# Patient Record
Sex: Male | Born: 1935
Health system: Southern US, Community
[De-identification: ages and names within clinical notes are randomized; demographics above are authoritative.]

## PROBLEM LIST (undated history)

## (undated) DIAGNOSIS — I480 Paroxysmal atrial fibrillation: Secondary | ICD-10-CM

## (undated) DIAGNOSIS — I341 Nonrheumatic mitral (valve) prolapse: Secondary | ICD-10-CM

## (undated) DIAGNOSIS — I495 Sick sinus syndrome: Secondary | ICD-10-CM

## (undated) DIAGNOSIS — I714 Abdominal aortic aneurysm, without rupture, unspecified: Secondary | ICD-10-CM

## (undated) DIAGNOSIS — D649 Anemia, unspecified: Secondary | ICD-10-CM

## (undated) DIAGNOSIS — J449 Chronic obstructive pulmonary disease, unspecified: Secondary | ICD-10-CM

## (undated) DIAGNOSIS — C8510 Unspecified B-cell lymphoma, unspecified site: Secondary | ICD-10-CM

## (undated) DIAGNOSIS — I4510 Unspecified right bundle-branch block: Secondary | ICD-10-CM

## (undated) DIAGNOSIS — I10 Essential (primary) hypertension: Secondary | ICD-10-CM

## (undated) DIAGNOSIS — J189 Pneumonia, unspecified organism: Secondary | ICD-10-CM

## (undated) DIAGNOSIS — C8590 Non-Hodgkin lymphoma, unspecified, unspecified site: Secondary | ICD-10-CM

## (undated) HISTORY — DX: Abdominal aortic aneurysm, without rupture: I71.4

## (undated) HISTORY — DX: Abdominal aortic aneurysm, without rupture, unspecified: I71.40

## (undated) HISTORY — DX: Paroxysmal atrial fibrillation: I48.0

## (undated) HISTORY — DX: Non-Hodgkin lymphoma, unspecified, unspecified site: C85.90

## (undated) HISTORY — DX: Unspecified right bundle-branch block: I45.10

## (undated) HISTORY — PX: CIRCUMCISION: SUR203

## (undated) HISTORY — PX: TONSILLECTOMY: SUR1361

## (undated) HISTORY — DX: Sick sinus syndrome: I49.5

## (undated) NOTE — *Deleted (*Deleted)
PT status update:  Pt has been NPO since midnight.    Pt stated to RN that it was his wish to

---

## 1979-03-26 HISTORY — PX: CHOLECYSTECTOMY: SHX55

## 1989-03-25 HISTORY — PX: CHEST TUBE INSERTION: SHX231

## 2001-07-23 ENCOUNTER — Encounter: Admission: RE | Admit: 2001-07-23 | Discharge: 2001-07-23 | Payer: Self-pay | Admitting: Internal Medicine

## 2001-07-23 ENCOUNTER — Encounter: Payer: Self-pay | Admitting: Internal Medicine

## 2001-08-14 ENCOUNTER — Other Ambulatory Visit: Admission: RE | Admit: 2001-08-14 | Discharge: 2001-08-14 | Payer: Self-pay | Admitting: Urology

## 2001-08-21 ENCOUNTER — Encounter: Payer: Self-pay | Admitting: Urology

## 2001-08-21 ENCOUNTER — Encounter: Admission: RE | Admit: 2001-08-21 | Discharge: 2001-08-21 | Payer: Self-pay | Admitting: Urology

## 2001-09-24 ENCOUNTER — Ambulatory Visit (HOSPITAL_COMMUNITY): Admission: RE | Admit: 2001-09-24 | Discharge: 2001-09-24 | Payer: Self-pay | Admitting: Gastroenterology

## 2008-01-10 ENCOUNTER — Encounter: Admission: RE | Admit: 2008-01-10 | Discharge: 2008-01-10 | Payer: Self-pay | Admitting: Internal Medicine

## 2010-08-20 HISTORY — PX: CARDIOVASCULAR STRESS TEST: SHX262

## 2010-09-02 ENCOUNTER — Ambulatory Visit (HOSPITAL_COMMUNITY)
Admission: RE | Admit: 2010-09-02 | Discharge: 2010-09-02 | Disposition: A | Discharge status: Home / Self Care | Payer: Medicare Other | Source: Ambulatory Visit | Attending: Cardiovascular Disease | Admitting: Cardiovascular Disease

## 2010-09-02 DIAGNOSIS — I4891 Unspecified atrial fibrillation: Secondary | ICD-10-CM | POA: Insufficient documentation

## 2010-09-02 DIAGNOSIS — R9439 Abnormal result of other cardiovascular function study: Secondary | ICD-10-CM | POA: Insufficient documentation

## 2010-09-02 DIAGNOSIS — I714 Abdominal aortic aneurysm, without rupture, unspecified: Secondary | ICD-10-CM | POA: Insufficient documentation

## 2010-09-02 DIAGNOSIS — I059 Rheumatic mitral valve disease, unspecified: Secondary | ICD-10-CM | POA: Insufficient documentation

## 2010-09-02 DIAGNOSIS — I10 Essential (primary) hypertension: Secondary | ICD-10-CM | POA: Insufficient documentation

## 2010-09-02 HISTORY — PX: CARDIAC CATHETERIZATION: SHX172

## 2010-09-10 NOTE — Procedures (Signed)
  NAME:  [name], [name] NO.:  [id number]  MEDICAL RECORD NO.:  [id number]           PATIENT TYPE:  O  LOCATION:  [facility]                         FACILITY:  [facility]  PHYSICIAN:  Nicki Guadalajara, M.D.     DATE OF BIRTH:  [birth year]  DATE OF PROCEDURE:  09/02/2010 DATE OF DISCHARGE:  09/02/2010                           CARDIAC CATHETERIZATION   INDICATIONS:  [name] is a 77 year old gentleman who has a history of mitral valve prolapse and paroxysmal atrial fibrillation.  He has also history of hypertension.  He recently underwent a nuclear perfusion study, which revealed suggestion of mild scar with possible mild-to-moderate ischemia in the basal inferoseptal, basal inferior, mid inferoseptal, mid inferior region.  Poststress ejection fraction was normal at 56%.  He had normal systolic function.  This study was different from a prior study and consequently, he is now referred for definitive cardiac catheterization.PROCEDURE:  After premedication with Versed 1 mg plus fentanyl 25 mcg, the patient was prepped and draped in usual fashion.  His right femoral artery was punctured anteriorly and a 5-French sheath was inserted without difficulty.  Diagnostic cardiac catheterization was done utilizing 5-French Judkins for left and right coronary catheters.  A 5- French pigtail catheter was used for a left ventriculography.  With the patient's hypertensive history, distal aortography was also performed to make sure there was no evidence for renovascular etiology.  Hemostasis was obtained by direct manual pressure.  The patient tolerated the procedure well.  HEMODYNAMIC DATA:  Central aortic pressure is 148/64.  Left ventricular pressure 148/70, post A-wave 23.  ANGIOGRAPHIC DATA:  The left main coronary artery was angiographically normal and bifurcated into a large LAD system and a small circumflex system.  The LAD was angiographically normal and gave rise to several  diagonal vessels and septal perforating arteries and extended to the apex.  The circumflex vessel with a small vessel, which gave rise to one marginal vessel.  The right coronary was a large dominant vessel that gave rise to a large PDA and posterolateral system and was free of significant disease.  Left ventriculography revealed evidence for mitral valve prolapse.  LV function was normal without wall motion abnormality with an ejection fraction of at least 55%.  Distal aortography revealed normal renal arteries.  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  IMPRESSION: 1. Normal left ventricular function. 2. Mitral valve prolapse. 3. Normal coronary arteries. 4. 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          ______________________________ Nicki Guadalajara, M.D.     TK/MEDQ  D:  09/02/2010  T:  09/03/2010  Job:  [id number]  cc:   Gerlene Burdock A. Alanda Amass, M.D. Theressa Millard, M.D.  Electronically Signed by Nicki Guadalajara M.D. on 09/10/2010 11:18:26 AM

## 2010-12-10 NOTE — Procedures (Signed)
[facility]  Patient:    Thomas Doyle, Thomas Doyle Visit Number: [id number] MRN: [id number]          Service Type: Attending:  Verlin Grills, M.D. Dictated by:   Verlin Grills, M.D. Proc. Date: 09/24/01   CC:         Jerl Santos, M.D.   Procedure Report  REFERRING PHYSICIAN:  Jerl Santos, M.D., [facility].  PROCEDURE:  Screening colonoscopy.  PROCEDURE INDICATION:  [name] is a 77 year old male born 1929-07-02. [name] is referred for his first screening colonoscopy with polypectomy to prevent colon cancer.  I discussed with [name] the complications associated with colonoscopy and polypectomy including a 15 per 1000 risk of bleeding and 4 per 1000 risk of colon perforation requiring surgical repair. [name] has signed the operative permit.  ENDOSCOPIST:  Verlin Grills, M.D.  PREMEDICATION:  Versed 5 mg, Demerol 50 mg.  ENDOSCOPE:  Olympus Pediatric colonoscope.  DESCRIPTION OF PROCEDURE:  After obtaining informed consent, [name] was placed in the left lateral decubitus position. I administered intravenous Demerol and intravenous Versed to achieve conscious sedation for the procedure. The patients blood pressure, oxygen saturation, and cardiac rhythm were monitored throughout the procedure and documented in the medical record.  Anal inspection was normal. Digital rectal exam revealed a nonnodular prostate. The Olympus Pediatric video colonoscope was introduced into the rectum and easily advanced to the cecum. Colonic preparation for the exam today was excellent.  Rectum:  Normal.  Sigmoid colon and descending colon:  Left colonic diverticulosis.  Splenic flexure:  Normal.  Transverse colon:  Normal.  Hepatic flexure:  Normal.  Ascending colon:  Normal.  Cecum and ileocecal valve:  Normal.  ASSESSMENT:  Left colonic diverticulosis; otherwise normal  proctocolonoscopy to the cecum. No endoscopic evidence for the presence of colorectal neoplasia.   RECOMMENDATIONS:  Repeat colonoscopy in 10 years. Dictated by:   Verlin Grills, M.D. Attending:  Verlin Grills, M.D. DD:  09/24/01 TD:  09/24/01 Job: [id number]

## 2011-10-24 DIAGNOSIS — J189 Pneumonia, unspecified organism: Secondary | ICD-10-CM

## 2011-10-24 HISTORY — DX: Pneumonia, unspecified organism: J18.9

## 2011-11-23 ENCOUNTER — Inpatient Hospital Stay (HOSPITAL_COMMUNITY): Payer: Medicare Other

## 2011-11-23 ENCOUNTER — Encounter (HOSPITAL_COMMUNITY): Payer: Self-pay | Admitting: Emergency Medicine

## 2011-11-23 ENCOUNTER — Inpatient Hospital Stay (HOSPITAL_COMMUNITY)
Admission: EM | Admit: 2011-11-23 | Discharge: 2011-11-25 | DRG: 841 | Disposition: A | Discharge status: Home / Self Care | Payer: Medicare Other | Attending: Internal Medicine | Admitting: Internal Medicine

## 2011-11-23 ENCOUNTER — Emergency Department (HOSPITAL_COMMUNITY): Payer: Medicare Other

## 2011-11-23 DIAGNOSIS — E860 Dehydration: Secondary | ICD-10-CM | POA: Diagnosis present

## 2011-11-23 DIAGNOSIS — I4891 Unspecified atrial fibrillation: Secondary | ICD-10-CM | POA: Diagnosis present

## 2011-11-23 DIAGNOSIS — I10 Essential (primary) hypertension: Secondary | ICD-10-CM | POA: Diagnosis present

## 2011-11-23 DIAGNOSIS — R339 Retention of urine, unspecified: Secondary | ICD-10-CM | POA: Diagnosis present

## 2011-11-23 DIAGNOSIS — J449 Chronic obstructive pulmonary disease, unspecified: Secondary | ICD-10-CM | POA: Diagnosis present

## 2011-11-23 DIAGNOSIS — R17 Unspecified jaundice: Secondary | ICD-10-CM | POA: Diagnosis present

## 2011-11-23 DIAGNOSIS — N179 Acute kidney failure, unspecified: Secondary | ICD-10-CM

## 2011-11-23 DIAGNOSIS — E782 Mixed hyperlipidemia: Secondary | ICD-10-CM

## 2011-11-23 DIAGNOSIS — C8589 Other specified types of non-Hodgkin lymphoma, extranodal and solid organ sites: Principal | ICD-10-CM | POA: Diagnosis present

## 2011-11-23 DIAGNOSIS — C8510 Unspecified B-cell lymphoma, unspecified site: Secondary | ICD-10-CM

## 2011-11-23 DIAGNOSIS — N390 Urinary tract infection, site not specified: Secondary | ICD-10-CM | POA: Diagnosis present

## 2011-11-23 DIAGNOSIS — J4489 Other specified chronic obstructive pulmonary disease: Secondary | ICD-10-CM | POA: Diagnosis present

## 2011-11-23 DIAGNOSIS — D649 Anemia, unspecified: Secondary | ICD-10-CM | POA: Diagnosis present

## 2011-11-23 DIAGNOSIS — A498 Other bacterial infections of unspecified site: Secondary | ICD-10-CM | POA: Diagnosis present

## 2011-11-23 DIAGNOSIS — D696 Thrombocytopenia, unspecified: Secondary | ICD-10-CM | POA: Diagnosis present

## 2011-11-23 HISTORY — DX: Unspecified B-cell lymphoma, unspecified site: C85.10

## 2011-11-23 HISTORY — DX: Anemia, unspecified: D64.9

## 2011-11-23 HISTORY — DX: Essential (primary) hypertension: I10

## 2011-11-23 HISTORY — DX: Chronic obstructive pulmonary disease, unspecified: J44.9

## 2011-11-23 HISTORY — DX: Pneumonia, unspecified organism: J18.9

## 2011-11-23 HISTORY — DX: Nonrheumatic mitral (valve) prolapse: I34.1

## 2011-11-23 LAB — CBC
MCH: 33.5 pg (ref 26.0–34.0)
MCHC: 34.3 g/dL (ref 30.0–36.0)
MCV: 97.5 fL (ref 78.0–100.0)
Platelets: 124 10*3/uL — ABNORMAL LOW (ref 150–400)

## 2011-11-23 LAB — DIFFERENTIAL
Basophils Relative: 1 % (ref 0–1)
Eosinophils Absolute: 0.6 10*3/uL (ref 0.0–0.7)
Eosinophils Relative: 14 % — ABNORMAL HIGH (ref 0–5)
Monocytes Relative: 11 % (ref 3–12)
Neutrophils Relative %: 36 % — ABNORMAL LOW (ref 43–77)

## 2011-11-23 LAB — COMPREHENSIVE METABOLIC PANEL
Albumin: 3.5 g/dL (ref 3.5–5.2)
Alkaline Phosphatase: 109 U/L (ref 39–117)
BUN: 22 mg/dL (ref 6–23)
Calcium: 10.7 mg/dL — ABNORMAL HIGH (ref 8.4–10.5)
GFR calc Af Amer: 36 mL/min — ABNORMAL LOW (ref >90)
Potassium: 3.8 mEq/L (ref 3.5–5.1)
Sodium: 135 mEq/L (ref 135–145)
Total Protein: 5.5 g/dL — ABNORMAL LOW (ref 6.0–8.3)

## 2011-11-23 LAB — URINALYSIS, ROUTINE W REFLEX MICROSCOPIC
Bilirubin Urine: NEGATIVE
Ketones, ur: NEGATIVE mg/dL
Nitrite: NEGATIVE
Urobilinogen, UA: 1 mg/dL (ref 0.0–1.0)

## 2011-11-23 LAB — LIPASE, BLOOD: Lipase: 8 U/L — ABNORMAL LOW (ref 11–59)

## 2011-11-23 LAB — ABO/RH: ABO/RH(D): O POS

## 2011-11-23 LAB — URINE MICROSCOPIC-ADD ON

## 2011-11-23 MED ORDER — IRBESARTAN 150 MG PO TABS
150.0000 mg | ORAL_TABLET | Freq: Every day | ORAL | Status: DC
  Administered 2011-11-24 – 2011-11-25 (×2): 150 mg via ORAL
  Filled 2011-11-23 (×2): qty 1

## 2011-11-23 MED ORDER — IPRATROPIUM BROMIDE 0.02 % IN SOLN
0.5000 mg | Freq: Once | RESPIRATORY_TRACT | Status: AC
  Administered 2011-11-23: 0.5 mg via RESPIRATORY_TRACT
  Filled 2011-11-23: qty 2.5

## 2011-11-23 MED ORDER — ACETAMINOPHEN 650 MG RE SUPP: 650.0000 mg | Freq: Four times a day (QID) | RECTAL | Status: DC | PRN

## 2011-11-23 MED ORDER — ONDANSETRON HCL 4 MG/2ML IJ SOLN
4.0000 mg | Freq: Once | INTRAMUSCULAR | Status: AC
  Administered 2011-11-23: 4 mg via INTRAVENOUS
  Filled 2011-11-23: qty 2

## 2011-11-23 MED ORDER — OMEGA-3-ACID ETHYL ESTERS 1 G PO CAPS
1.0000 g | ORAL_CAPSULE | Freq: Every day | ORAL | Status: DC
  Administered 2011-11-24 – 2011-11-25 (×2): 1 g via ORAL
  Filled 2011-11-23 (×2): qty 1

## 2011-11-23 MED ORDER — SODIUM CHLORIDE 0.9 % IV SOLN: INTRAVENOUS | Status: DC

## 2011-11-23 MED ORDER — TAMSULOSIN HCL 0.4 MG PO CAPS
0.4000 mg | ORAL_CAPSULE | Freq: Every day | ORAL | Status: DC
  Administered 2011-11-23 – 2011-11-25 (×3): 0.4 mg via ORAL
  Filled 2011-11-23 (×3): qty 1

## 2011-11-23 MED ORDER — OMEGA-3 FATTY ACIDS 1000 MG PO CAPS: 1.0000 g | ORAL_CAPSULE | Freq: Every day | ORAL | Status: DC

## 2011-11-23 MED ORDER — ALBUTEROL SULFATE (5 MG/ML) 0.5% IN NEBU
5.0000 mg | INHALATION_SOLUTION | Freq: Once | RESPIRATORY_TRACT | Status: AC
  Administered 2011-11-23: 5 mg via RESPIRATORY_TRACT
  Filled 2011-11-23: qty 1

## 2011-11-23 MED ORDER — ALUM & MAG HYDROXIDE-SIMETH 200-200-20 MG/5ML PO SUSP: 30.0000 mL | Freq: Four times a day (QID) | ORAL | Status: DC | PRN

## 2011-11-23 MED ORDER — SODIUM CHLORIDE 0.9 % IV SOLN
INTRAVENOUS | Status: DC
  Administered 2011-11-24: 01:00:00 via INTRAVENOUS

## 2011-11-23 MED ORDER — ACETAMINOPHEN 325 MG PO TABS
650.0000 mg | ORAL_TABLET | Freq: Once | ORAL | Status: AC
  Administered 2011-11-23: 650 mg via ORAL
  Filled 2011-11-23: qty 2

## 2011-11-23 MED ORDER — ACETAMINOPHEN 325 MG PO TABS: 650.0000 mg | ORAL_TABLET | Freq: Four times a day (QID) | ORAL | Status: DC | PRN

## 2011-11-23 MED ORDER — DEXTROSE 5 % IV SOLN
1.0000 g | Freq: Once | INTRAVENOUS | Status: AC
  Administered 2011-11-23: 1 g via INTRAVENOUS
  Filled 2011-11-23: qty 10

## 2011-11-23 MED ORDER — POLYETHYLENE GLYCOL 3350 17 G PO PACK
17.0000 g | PACK | Freq: Every day | ORAL | Status: DC | PRN
  Filled 2011-11-23: qty 1

## 2011-11-23 MED ORDER — PREDNISONE 20 MG PO TABS
60.0000 mg | ORAL_TABLET | Freq: Once | ORAL | Status: AC
  Administered 2011-11-23: 60 mg via ORAL
  Filled 2011-11-23: qty 3

## 2011-11-23 MED ORDER — HYDROCODONE-ACETAMINOPHEN 5-325 MG PO TABS: 1.0000 | ORAL_TABLET | ORAL | Status: DC | PRN

## 2011-11-23 MED ORDER — ONDANSETRON HCL 4 MG PO TABS: 4.0000 mg | ORAL_TABLET | Freq: Four times a day (QID) | ORAL | Status: DC | PRN

## 2011-11-23 MED ORDER — VITAMIN D3 25 MCG (1000 UNIT) PO TABS
1000.0000 [IU] | ORAL_TABLET | Freq: Every day | ORAL | Status: DC
  Administered 2011-11-24 – 2011-11-25 (×2): 1000 [IU] via ORAL
  Filled 2011-11-23 (×2): qty 1

## 2011-11-23 MED ORDER — DIGOXIN 125 MCG PO TABS
125.0000 ug | ORAL_TABLET | Freq: Every day | ORAL | Status: DC
  Administered 2011-11-24 – 2011-11-25 (×2): 125 ug via ORAL
  Filled 2011-11-23 (×2): qty 1

## 2011-11-23 MED ORDER — ONDANSETRON HCL 4 MG/2ML IJ SOLN: 4.0000 mg | Freq: Four times a day (QID) | INTRAMUSCULAR | Status: DC | PRN

## 2011-11-23 MED ORDER — AMIODARONE HCL 200 MG PO TABS
200.0000 mg | ORAL_TABLET | Freq: Every day | ORAL | Status: DC
  Administered 2011-11-24 – 2011-11-25 (×2): 200 mg via ORAL
  Filled 2011-11-23 (×2): qty 1

## 2011-11-23 MED ORDER — DEXTROSE 5 % IV SOLN: 1.0000 g | INTRAVENOUS | Status: DC

## 2011-11-23 NOTE — ED Notes (Signed)
NWG:[id number]> Expected date:11/23/11<BR> Expected time:<BR> Means of arrival:<BR> Comments:<BR> EMS 41 GC - fever/chills

## 2011-11-23 NOTE — H&P (Addendum)
Hospital Admission Note Date: 11/23/2011  Patient name: Thomas Doyle Medical record number: [id number] Date of birth: [birth year] Age: 78 y.o. Gender: male PCP: Darnelle Bos, MD, MD  Attending physician: Maryruth Bun Meriah Shands, MD Emergency Contact: Kedarius Aloisi (Daugher): [phone] Code Status:  Full  Chief Complaint: Tremulousness and weakness.  History of Present Illness: Thomas Doyle is an 78 y.o. male with a PMH of recently diagnosed COPD and recent treatment of PNA (took amoxicillin) who presents to the hospital with progressive weakness x 2 weeks and tremulousness x 24 hours as well as nocturia (up to 6 x a night) accompanied by dysuria but no hematuria.  The patient also reports a 24 hours history of nausea and vomiting but no hematemesis.  No black or bloody stools.  No frank dyspnea.  Upon initial evaluation in the ER, he was found to be anemic with heme negative stool on rectal exam.  No prior history of blood transfusions, or GI bleeding.  The patient does take a baby aspirin daily.    Past Medical History Past Medical History  Diagnosis Date  . Hypertension   . A-fib   . Anemia   . Pneumonia April 2013  . COPD (chronic obstructive pulmonary disease) dx April 2013  . Mitral valve prolapse     Past Surgical History Past Surgical History  Procedure Date  . Cholecystectomy 1980's  . Chest tube insertion 1990's    fell  thru deck, punctured lung, chest tube  . Tonsillectomy   . Circumcision age 12    Meds: Prior to Admission medications   Medication Sig Start Date End Date Taking? Authorizing Provider  amiodarone (PACERONE) 200 MG tablet Take 200 mg by mouth daily.   Yes Historical Provider, MD  candesartan (ATACAND) 8 MG tablet Take 8 mg by mouth daily.   Yes Historical Provider, MD  cholecalciferol (VITAMIN D) 1000 UNITS tablet Take 1,000 Units by mouth daily.   Yes Historical Provider, MD  clopidogrel (PLAVIX) 75 MG tablet Take 75 mg by mouth daily.   Yes  Historical Provider, MD  digoxin (LANOXIN) 0.125 MG tablet Take 125 mcg by mouth daily.   Yes Historical Provider, MD  fish oil-omega-3 fatty acids 1000 MG capsule Take 1 g by mouth daily.   Yes Historical Provider, MD    Allergies: Review of patient's allergies indicates no known allergies.  Social History: History   Social History  . Marital Status: Married    Spouse Name: N/A    Number of Children: 2  . Years of Education: 14   Occupational History  . Retired from [employer]    Social History Main Topics  . Smoking status: Never Smoker   . Smokeless tobacco: Never Used  . Alcohol Use: No  . Drug Use: No  . Sexually Active:    Other Topics Concern  . Not on file   Social History Narrative   Widowed x 3 years.  Lives alone.  Independent of ADLs and ambulation.      Family History:  Family History  Problem Relation Age of Onset  . Cancer Father   . Heart attack Father   . Atrial fibrillation Mother     Review of Systems: Constitutional: + recent fever and chills but none since being on Amoxicillin;  Appetite diminished; +weight loss; +Fatigue.  HEENT: No blurry vision or diplopia, no pharyngitis or dysphagia CV: No chest pain or arrhythmia.  Resp: No SOB, + dry cough. GI: + N/V, + diarrhea, no melena  or hematochezia.  GU: + dysuria, no hematuria.  MSK: no myalgias/arthralgias.  Neuro:  No headache or focal neurological deficits, +paresthesias.  Psych: No depression or anxiety.  Endo: No thyroid disease or DM.  Skin: No rashes or lesions.  Heme: No anemia or blood dyscrasia   Physical Exam: Blood pressure 130/50, pulse 73, temperature 98.5 F (36.9 C), temperature source Oral, resp. rate 18, SpO2 97.00%. BP 130/50  Pulse 73  Temp(Src) 98.5 F (36.9 C) (Oral)  Resp 18  SpO2 97%  General Appearance:    Alert, cooperative, no distress, appears stated age  Head:    Normocephalic, without obvious abnormality, atraumatic  Eyes:    PERRL, conjunctiva/corneas clear, EOM's  intact     Ears:    Normal TM's external ear canals, both ears  Nose:   Nares normal, septum midline, mucosa normal, no drainage    or sinus tenderness  Throat:   Lips, mucosa, and tongue normal; teeth and gums normal  Neck:   Supple, symmetrical, trachea midline, no adenopathy;       thyroid:  No enlargement/tenderness/nodules; no carotid   bruit or JVD  Back:     Symmetric, no curvature, ROM normal, no CVA tenderness  Lungs:     Clear to auscultation bilaterally, respirations unlabored  Chest wall:    No tenderness or deformity  Heart:    Regular rate and rhythm, S1 and S2 normal, no murmur, rub   or gallop  Abdomen:     Soft, non-tender, bowel sounds active all four quadrants,    no masses, no organomegaly  Rectal:    Normal tone, no prostate enlargement, heme - per EDP  Extremities:   Extremities normal, atraumatic, no cyanosis, trace edema  Pulses:   2+ and symmetric all extremities  Skin:   Skin color pale, texture normal, turgor normal, no rashes or lesions  Lymph nodes:   Cervical, supraclavicular, and axillary nodes normal  Neurologic:   CNII-XII intact. Non-focal.   Lab results: Basic Metabolic Panel:  Lab 11/23/11 4098  NA 135  K 3.8  CL 99  CO2 24  GLUCOSE 100*  BUN 22  CREATININE 1.93*  CALCIUM 10.7*  MG --  PHOS --   GFR CrCl is unknown because there is no height on file for the current visit. Liver Function Tests:  Lab 11/23/11 1445  AST 17  ALT 14  ALKPHOS 109  BILITOT 1.4*  PROT 5.5*  ALBUMIN 3.5    Lab 11/23/11 1445  LIPASE 8*  AMYLASE --    CBC:  Lab 11/23/11 1445  WBC 4.4  NEUTROABS 1.6*  HGB 6.8*  HCT 19.8*  MCV 97.5  PLT 124*    Imaging results:  Dg Chest 2 View  11/23/2011  *RADIOLOGY REPORT*  Clinical Data: Acute mental status change.  SOB.  CHEST - 2 VIEW  Comparison: None  Findings: The heart size appears normal.  There is no pleural effusion or edema.  Biapical scarring is identified.  Scar like density is noted in the right  lung base.  The lungs appear hyperinflated and there is chronic interstitial coarsening.  No focal airspace consolidation noted.  IMPRESSION:  1.  No acute cardiopulmonary abnormalities. 2.  Suspect COPD.  Original Report Authenticated By: Rosealee Albee, M.D.    Assessment & Plan: Principal Problem:  *Normocytic anemia  Presenting signs and symptoms consistent with anemia.  With complaints of intermittent neuropathy, this may represent a B12 deficiency.  Stools are heme negative x1.  We'll send off stools x2 to recheck.  Mildly elevated bilirubin concerning for hemolysis. Will check LDH and haptoglobin.  Unfortunately, anemia panel not sent before blood transfusion initiated. We'll request the lab add it on if possible.  2 units of packed red blood cells ordered by emergency department physician. Active Problems:  Thrombocytopenia  May be secondary to nutritional deficiency.  Anemia panel requested  Acute renal failure  Baseline creatinine not known but no known history of renal disease.  May be secondary to dehydration given recent nausea and vomiting: We'll gently hydrate.  With complaints of nocturia, would obtain a renal ultrasound to rule out obstructive uropathy.  We'll start empirically on Flomax.  Dehydration  Gentle hydration ordered.  History of atrial fibrillation  Continue amiodarone.  Heart sounds regular on exam.  Hyperbilirubinemia  Rule out hemolysis with LDH and bilirubin.  Hypertension  Continue home antihypertensives.  COPD (chronic obstructive pulmonary disease)  Found incidentally on chest radiography when diagnosed with pneumonia.  Not on any treatment for this at present.  Urinary tract infection  Patient has pyuria and bacteria on urinalysis.  Started empirically on Rocephin by the emergency department physician, which we will continue.  Urine culture not sent by emergency department physician before antibiotics initiated,  unfortunately.  Will see if lab can add to urine collected for urinalysis.  Prophylaxis: PAS hoses  Time Spent On Admission: 1 hour.  Enma Maeda 11/23/2011, 6:40 PM Pager [phone]

## 2011-11-23 NOTE — ED Notes (Signed)
Pt given urinal.

## 2011-11-23 NOTE — ED Provider Notes (Signed)
History     CSN: [id number]  Arrival date & time 11/23/11  1315   First MD Initiated Contact with Patient 11/23/11 1319      Chief Complaint  Patient presents with  . Fever  . Chills    (Consider location/radiation/quality/duration/timing/severity/associated sxs/prior treatment) HPI History provided by pt.   Patient's PCP diagnosed him w/ pneumonia via CXR approx 2 weeks ago.  Was experiencing cough, f/c and generalized weakness at that time.  Took a week long course of amoxicillin and followed up with his doctor again.  Infection cleared on CXR but patient had persistent cough.  Was told he may need to be evaluated for COPD if cough did not resolve in 7-10 days.  Pt presents to ED today because cough, non-productive and aggravated by talking, has not improved, and is now associated w/ rigors and worsening generalized weakness.  No known fever and denies SOB and chest/upper back pain.  Has also had N/V/D x 1wk and is not tolerating pos.  Only experiences abd pain at night when he goes to bed; mild and left-sided.  Denies  Hematemesis/hematochezia/melena. Developed dysuria this morning.  Has had nocturia x ~73mo and has already been evaluated for prostate cancer.    Past Medical History  Diagnosis Date  . Hypertension   . A-fib     No past surgical history on file.  No family history on file.  History  Substance Use Topics  . Smoking status: Never Smoker   . Smokeless tobacco: Not on file  . Alcohol Use:       Review of Systems  All other systems reviewed and are negative.    Allergies  Review of patient's allergies indicates no known allergies.  Home Medications   Current Outpatient Rx  Name Route Sig Dispense Refill  . AMIODARONE HCL 200 MG PO TABS Oral Take 200 mg by mouth daily.    [name] CANDESARTAN CILEXETIL 8 MG PO TABS Oral Take 8 mg by mouth daily.    [name] VITAMIN D 1000 UNITS PO TABS Oral Take 1,000 Units by mouth daily.    [name] CLOPIDOGREL BISULFATE 75 MG PO TABS  Oral Take 75 mg by mouth daily.    [name] DIGOXIN 0.125 MG PO TABS Oral Take 125 mcg by mouth daily.    . OMEGA-3 FATTY ACIDS 1000 MG PO CAPS Oral Take 1 g by mouth daily.      BP 148/63  Pulse 90  Temp(Src) 99.5 F (37.5 C) (Oral)  Resp 12  SpO2 96%  Physical Exam  Nursing note and vitals reviewed. Constitutional: He is oriented to person, place, and time. He appears well-developed and well-nourished. No distress.  HENT:  Head: Normocephalic and atraumatic.       Dry mucous membranes  Eyes:       Normal appearance  Neck: Normal range of motion.  Cardiovascular: Normal rate and regular rhythm.   Pulmonary/Chest: Effort normal and breath sounds normal. No respiratory distress. He has no wheezes. He has no rales.       Dry cough.  O2 sat 95-96% on rm air but pt feels better w/ 2L [place].  Abdominal: Soft. Bowel sounds are normal. He exhibits no distension and no mass. There is no tenderness. There is no rebound and no guarding.  Genitourinary:       No CVA tenderness.  Nml rectal tone.  Prostate does not seem to be enlarged and is non-tender.  Stool color normal.  No gross blood.  Musculoskeletal: Normal range of motion.  Neurological: He is alert and oriented to person, place, and time.  Skin: Skin is warm and dry. No rash noted.  Psychiatric: He has a normal mood and affect. His behavior is normal.    ED Course  Procedures (including critical care time)  Labs Reviewed  URINALYSIS, ROUTINE W REFLEX MICROSCOPIC - Abnormal; Notable for the following:    APPearance CLOUDY (*)    Leukocytes, UA MODERATE (*)    All other components within normal limits  CBC - Abnormal; Notable for the following:    RBC 2.03 (*)    Hemoglobin 6.8 (*)    HCT 19.8 (*)    RDW 16.2 (*)    Platelets 124 (*)    All other components within normal limits  DIFFERENTIAL - Abnormal; Notable for the following:    Neutrophils Relative 36 (*)    Neutro Abs 1.6 (*)    Eosinophils Relative 14 (*)    All other  components within normal limits  COMPREHENSIVE METABOLIC PANEL - Abnormal; Notable for the following:    Glucose, Bld 100 (*)    Creatinine, Ser 1.93 (*)    Calcium 10.7 (*)    Total Protein 5.5 (*)    Total Bilirubin 1.4 (*)    GFR calc non Af Amer 31 (*)    GFR calc Af Amer 36 (*)    All other components within normal limits  LIPASE, BLOOD - Abnormal; Notable for the following:    Lipase 8 (*)    All other components within normal limits  URINE MICROSCOPIC-ADD ON - Abnormal; Notable for the following:    Bacteria, UA FEW (*)    Crystals CA OXALATE CRYSTALS (*)    All other components within normal limits  TYPE AND SCREEN  OCCULT BLOOD, POC DEVICE  OCCULT BLOOD X 1 CARD TO LAB, STOOL  PREPARE RBC (CROSSMATCH)  ABO/RH   Dg Chest 2 View  11/23/2011  *RADIOLOGY REPORT*  Clinical Data: Acute mental status change.  SOB.  CHEST - 2 VIEW  Comparison: None  Findings: The heart size appears normal.  There is no pleural effusion or edema.  Biapical scarring is identified.  Scar like density is noted in the right lung base.  The lungs appear hyperinflated and there is chronic interstitial coarsening.  No focal airspace consolidation noted.  IMPRESSION:  1.  No acute cardiopulmonary abnormalities. 2.  Suspect COPD.  Original Report Authenticated By: Rosealee Albee, M.D.     1. Anemia   2. Urinary tract infection       MDM  78yo M, recently treated for pneumonia, presents w/ persistent cough, rigors, worsening generalized weakness, and new onset N/V/D and dysuria.  On exam, afebrile, hypertensive, mildly dehydrated, heart w/ RRR,  coughing, lungs clear, abd benign/non-tender, no CVA ttp.  Will order zofran for nausea and breathing treatment for cough.  Labs and CXR pending.   Pt reports that coughing improved w/ breathing treatment and is nausea controlled. Labs sig for hgb 6.8, hemoccult neg, elevated Cr (no prior for comparison), infected urine.  CXR suspicious for COPD.  All results  discussed w/ pt.  Type and cross for 2 units pRBC and 1g IV rocephin ordered.  He has developed a fever and po tylenol ordered as well.  [facility] consulted for admission.          Otilio Miu, [place] 11/24/11 [phone]

## 2011-11-23 NOTE — ED Notes (Signed)
Unable to obtain labs at this time due to patient receiving blood.

## 2011-11-23 NOTE — ED Notes (Signed)
Per EMS: Pt was dx w/ pneumonia 2.5 weeks ago.  Was prescribed amoxicillin, got better but about 3 days ago, he began to have chills, cough, and general malaise.

## 2011-11-24 DIAGNOSIS — N289 Disorder of kidney and ureter, unspecified: Secondary | ICD-10-CM

## 2011-11-24 LAB — RETICULOCYTES: Retic Ct Pct: 2.9 % (ref 0.4–3.1)

## 2011-11-24 LAB — IRON AND TIBC
TIBC: 220 ug/dL (ref 215–435)
UIBC: 83 ug/dL — ABNORMAL LOW (ref 125–400)

## 2011-11-24 LAB — TYPE AND SCREEN
ABO/RH(D): O POS
Antibody Screen: NEGATIVE
Unit division: 0

## 2011-11-24 LAB — FERRITIN: Ferritin: 331 ng/mL — ABNORMAL HIGH (ref 22–322)

## 2011-11-24 LAB — CBC
HCT: 23.9 % — ABNORMAL LOW (ref 39.0–52.0)
MCHC: 34.7 g/dL (ref 30.0–36.0)
MCV: 90.5 fL (ref 78.0–100.0)
RDW: 19.8 % — ABNORMAL HIGH (ref 11.5–15.5)

## 2011-11-24 LAB — BASIC METABOLIC PANEL
BUN: 27 mg/dL — ABNORMAL HIGH (ref 6–23)
Chloride: 100 mEq/L (ref 96–112)
Creatinine, Ser: 1.76 mg/dL — ABNORMAL HIGH (ref 0.50–1.35)
GFR calc Af Amer: 40 mL/min — ABNORMAL LOW (ref >90)
GFR calc non Af Amer: 35 mL/min — ABNORMAL LOW (ref >90)
Glucose, Bld: 157 mg/dL — ABNORMAL HIGH (ref 70–99)

## 2011-11-24 MED ORDER — BOOST / RESOURCE BREEZE PO LIQD
1.0000 | Freq: Every day | ORAL | Status: DC
  Administered 2011-11-24: 1 via ORAL

## 2011-11-24 NOTE — ED Provider Notes (Signed)
Medical screening examination/treatment/procedure(s) were conducted as a shared visit with non-physician practitioner(s) and myself.  I personally evaluated the patient during the encounter This elderly gentleman, who on my exam is in no distress, now presents with concerns of weakness and nausea/vomiting/diarrhea.  The patient's labs are notable for evidence of anemia as well as a urinary tract infection.  The patient was provided with antibiotics and the process for transfusion was started in the emergency department.  The patient was admitted for further evaluation and management.  Gerhard Munch, MD 11/24/11 1018

## 2011-11-24 NOTE — Progress Notes (Signed)
Subjective: Admission history and physical reviewed, patient presented to hospital with fatigue, found to be anemic, Hemoccult negative. Outpatient labs also showed anemia. Patient describes a history of progressive fatigue no energy. Recently was treated with antibiotics for pneumonia. Also on presentation to the ED had some dysuria. No complaint of shortness of breath, occasional cough, no back pain. No previous evaluation for anemia.  Objective: Vital signs in last 24 hours: Temp:  [97.7 F (36.5 C)-100.7 F (38.2 C)] 97.7 F (36.5 C) (05/02 0519) Pulse Rate:  [59-90] 70  (05/02 0923) Resp:  [12-20] 20  (05/02 0519) BP: (104-206)/(41-78) 104/41 mmHg (05/02 0519) SpO2:  [96 %-100 %] 98 % (05/02 0519) Weight:  [66.2 kg (145 lb 15.1 oz)] 66.2 kg (145 lb 15.1 oz) (05/01 2019) Weight change:  Last BM Date: 11/23/11  Intake/Output from previous day: 05/01 0701 - 05/02 0700 In: 577.5 [I.V.:552.5; Blood:25] Out: 300 [Urine:300] Intake/Output this shift: Total I/O In: 480 [P.O.:480] Out: 400 [Urine:400]  General appearance: alert and cooperative Resp: clear to auscultation bilaterally Cardio: regular rate and rhythm, S1, S2 normal, no murmur, click, rub or gallop GI: soft, non-tender; bowel sounds normal; no masses,  no organomegaly Extremities: extremities normal, atraumatic, no cyanosis or edema  Lab Results:  Results for orders placed during the hospital encounter of 11/23/11 (from the past 24 hour(s))  CBC     Status: Abnormal   Collection Time   11/23/11  2:45 PM      Component Value Range   WBC 4.4  4.0 - 10.5 (K/uL)   RBC 2.03 (*) 4.22 - 5.81 (MIL/uL)   Hemoglobin 6.8 (*) 13.0 - 17.0 (g/dL)   HCT 16.1 (*) 09.6 - 52.0 (%)   MCV 97.5  78.0 - 100.0 (fL)   MCH 33.5  26.0 - 34.0 (pg)   MCHC 34.3  30.0 - 36.0 (g/dL)   RDW 04.5 (*) 40.9 - 15.5 (%)   Platelets 124 (*) 150 - 400 (K/uL)  DIFFERENTIAL     Status: Abnormal   Collection Time   11/23/11  2:45 PM      Component Value  Range   Neutrophils Relative 36 (*) 43 - 77 (%)   Neutro Abs 1.6 (*) 1.7 - 7.7 (K/uL)   Lymphocytes Relative 38  12 - 46 (%)   Lymphs Abs 1.7  0.7 - 4.0 (K/uL)   Monocytes Relative 11  3 - 12 (%)   Monocytes Absolute 0.5  0.1 - 1.0 (K/uL)   Eosinophils Relative 14 (*) 0 - 5 (%)   Eosinophils Absolute 0.6  0.0 - 0.7 (K/uL)   Basophils Relative 1  0 - 1 (%)   Basophils Absolute 0.0  0.0 - 0.1 (K/uL)  COMPREHENSIVE METABOLIC PANEL     Status: Abnormal   Collection Time   11/23/11  2:45 PM      Component Value Range   Sodium 135  135 - 145 (mEq/L)   Potassium 3.8  3.5 - 5.1 (mEq/L)   Chloride 99  96 - 112 (mEq/L)   CO2 24  19 - 32 (mEq/L)   Glucose, Bld 100 (*) 70 - 99 (mg/dL)   BUN 22  6 - 23 (mg/dL)   Creatinine, Ser 8.11 (*) 0.50 - 1.35 (mg/dL)   Calcium 91.4 (*) 8.4 - 10.5 (mg/dL)   Total Protein 5.5 (*) 6.0 - 8.3 (g/dL)   Albumin 3.5  3.5 - 5.2 (g/dL)   AST 17  0 - 37 (U/L)   ALT 14  0 - 53 (U/L)   Alkaline Phosphatase 109  39 - 117 (U/L)   Total Bilirubin 1.4 (*) 0.3 - 1.2 (mg/dL)   GFR calc non Af Amer 31 (*) >90 (mL/min)   GFR calc Af Amer 36 (*) >90 (mL/min)  LIPASE, BLOOD     Status: Abnormal   Collection Time   11/23/11  2:45 PM      Component Value Range   Lipase 8 (*) 11 - 59 (U/L)  URINALYSIS, ROUTINE W REFLEX MICROSCOPIC     Status: Abnormal   Collection Time   11/23/11  3:32 PM      Component Value Range   Color, Urine YELLOW  YELLOW    APPearance CLOUDY (*) CLEAR    Specific Gravity, Urine 1.019  1.005 - 1.030    pH 7.0  5.0 - 8.0    Glucose, UA NEGATIVE  NEGATIVE (mg/dL)   Hgb urine dipstick NEGATIVE  NEGATIVE    Bilirubin Urine NEGATIVE  NEGATIVE    Ketones, ur NEGATIVE  NEGATIVE (mg/dL)   Protein, ur NEGATIVE  NEGATIVE (mg/dL)   Urobilinogen, UA 1.0  0.0 - 1.0 (mg/dL)   Nitrite NEGATIVE  NEGATIVE    Leukocytes, UA MODERATE (*) NEGATIVE   URINE MICROSCOPIC-ADD ON     Status: Abnormal   Collection Time   11/23/11  3:32 PM      Component Value Range    Squamous Epithelial / LPF RARE  RARE    WBC, UA 7-10  <3 (WBC/hpf)   Bacteria, UA FEW (*) RARE    Crystals CA OXALATE CRYSTALS (*) NEGATIVE   OCCULT BLOOD, POC DEVICE     Status: Normal   Collection Time   11/23/11  3:43 PM      Component Value Range   Fecal Occult Bld NEGATIVE    TYPE AND SCREEN     Status: Normal   Collection Time   11/23/11  3:50 PM      Component Value Range   ABO/RH(D) O POS     Antibody Screen NEG     Sample Expiration 11/26/2011     Unit Number [id number]     Blood Component Type RED CELLS,LR     Unit division 00     Status of Unit ISSUED,FINAL     Transfusion Status OK TO TRANSFUSE     Crossmatch Result Compatible     Unit Number [id number]     Blood Component Type RBC LR PHER1     Unit division 00     Status of Unit ISSUED,FINAL     Transfusion Status OK TO TRANSFUSE     Crossmatch Result Compatible    ABO/RH     Status: Normal   Collection Time   11/23/11  3:50 PM      Component Value Range   ABO/RH(D) O POS    PREPARE RBC (CROSSMATCH)     Status: Normal   Collection Time   11/23/11  4:00 PM      Component Value Range   Order Confirmation ORDER PROCESSED BY BLOOD BANK    LACTATE DEHYDROGENASE     Status: Normal   Collection Time   11/24/11  1:53 AM      Component Value Range   LDH 210  94 - 250 (U/L)  CBC     Status: Abnormal   Collection Time   11/24/11  1:53 AM      Component Value Range   WBC 4.1  4.0 - 10.5 (K/uL)  RBC 2.64 (*) 4.22 - 5.81 (MIL/uL)   Hemoglobin 8.3 (*) 13.0 - 17.0 (g/dL)   HCT 40.9 (*) 81.1 - 52.0 (%)   MCV 90.5  78.0 - 100.0 (fL)   MCH 31.4  26.0 - 34.0 (pg)   MCHC 34.7  30.0 - 36.0 (g/dL)   RDW 91.4 (*) 78.2 - 15.5 (%)   Platelets 103 (*) 150 - 400 (K/uL)  BASIC METABOLIC PANEL     Status: Abnormal   Collection Time   11/24/11  1:53 AM      Component Value Range   Sodium 135  135 - 145 (mEq/L)   Potassium 4.0  3.5 - 5.1 (mEq/L)   Chloride 100  96 - 112 (mEq/L)   CO2 24  19 - 32 (mEq/L)   Glucose, Bld 157 (*) 70 - 99  (mg/dL)   BUN 27 (*) 6 - 23 (mg/dL)   Creatinine, Ser 9.56 (*) 0.50 - 1.35 (mg/dL)   Calcium 9.9  8.4 - 21.3 (mg/dL)   GFR calc non Af Amer 35 (*) >90 (mL/min)   GFR calc Af Amer 40 (*) >90 (mL/min)  RETICULOCYTES     Status: Abnormal   Collection Time   11/24/11  1:53 AM      Component Value Range   Retic Ct Pct 2.9  0.4 - 3.1 (%)   RBC. 2.66 (*) 4.22 - 5.81 (MIL/uL)   Retic Count, Manual 77.1  19.0 - 186.0 (K/uL)      Studies/Results: Dg Chest 2 View  11/23/2011  *RADIOLOGY REPORT*  Clinical Data: Acute mental status change.  SOB.  CHEST - 2 VIEW  Comparison: None  Findings: The heart size appears normal.  There is no pleural effusion or edema.  Biapical scarring is identified.  Scar like density is noted in the right lung base.  The lungs appear hyperinflated and there is chronic interstitial coarsening.  No focal airspace consolidation noted.  IMPRESSION:  1.  No acute cardiopulmonary abnormalities. 2.  Suspect COPD.  Original Report Authenticated By: Rosealee Albee, M.D.   US Renal  11/23/2011  *RADIOLOGY REPORT*  Clinical Data: Evaluate for obstructive uropathy in patient with acute renal failure  RENAL/URINARY TRACT ULTRASOUND COMPLETE  Comparison:  Abdominal CT - 01/10/2008; chest radiograph - earlier same day  Findings:  Right Kidney:  Normal cortical thickness, echogenicity and size, measuring 10.5 cm in length.  No focal renal lesions.  No echogenic renal stones. Note is again made of an extrarenal pelvis.  No urinary obstruction.  Left Kidney:  Normal cortical thickness, echogenicity and size, measuring 10.3 cm in length.  No focal renal lesions.  No echogenic renal stones.  No urinary obstruction.  Bladder:  There is incomplete emptying of the urinary bladder. Bladder is otherwise normal.  The prostate again appears borderline enlarged with mass effect upon the undersurface of the bladder.  The spleen is enlarged measuring 14.5 x 9.3 x 12.8 cm (869 cc). Small right-sided pleural  effusion.  IMPRESSION: 1.  No evidence of hydronephrosis. 2.  The urinary bladder is distended with incomplete voiding, likely secondary to an enlarged prostate. 3.  Note is made of a small right-sided pleural effusion. 4.  Splenomegaly incidentally noted.  Original Report Authenticated By: Waynard Reeds, M.D.    Medications:  Prior to Admission:  Prescriptions prior to admission  Medication Sig Dispense Refill  . amiodarone (PACERONE) 200 MG tablet Take 200 mg by mouth daily.      . candesartan (ATACAND)  8 MG tablet Take 8 mg by mouth daily.      . cholecalciferol (VITAMIN D) 1000 UNITS tablet Take 1,000 Units by mouth daily.      . clopidogrel (PLAVIX) 75 MG tablet Take 75 mg by mouth daily.      . digoxin (LANOXIN) 0.125 MG tablet Take 125 mcg by mouth daily.      . fish oil-omega-3 fatty acids 1000 MG capsule Take 1 g by mouth daily.       Scheduled:   . acetaminophen  650 mg Oral Once  . albuterol  5 mg Nebulization Once  . amiodarone  200 mg Oral Daily  . cefTRIAXone (ROCEPHIN)  IV  1 g Intravenous Once  . cefTRIAXone (ROCEPHIN)  IV  1 g Intravenous Q24H  . cholecalciferol  1,000 Units Oral Daily  . digoxin  125 mcg Oral Daily  . ipratropium  0.5 mg Nebulization Once  . irbesartan  150 mg Oral Daily  . omega-3 acid ethyl esters  1 g Oral Daily  . ondansetron (ZOFRAN) IV  4 mg Intravenous Once  . predniSONE  60 mg Oral Once  . Tamsulosin HCl  0.4 mg Oral Daily  . DISCONTD: sodium chloride   Intravenous STAT  . DISCONTD: fish oil-omega-3 fatty acids  1 g Oral Daily   Continuous:   . sodium chloride 75 mL/hr at 11/24/11 0058    Assessment/Plan: @HPROL @  Principal Problem:  *Normocytic anemia Active Problems:  Thrombocytopenia  Acute renal failure  Dehydration  History of atrial fibrillation  Hyperbilirubinemia  Hypertension  COPD (chronic obstructive pulmonary disease)  UTI (lower urinary tract infection)  Patient feels much better since transfusion., History  does not suggest blood loss, differential includes myelodysplastic process, myeloma kidney, homolysis with elevated bilirubin. Hematology will be consult, SPEP UPEP will be obtained. Await further input from hematology. Please note ultrasound is suggested splenomegaly. Current exam does not suggest acute infection, antibiotics will be DC, urine culture will be followed up, with retained urine on ultrasound patient could have a component of bladder outlet obstruction, continue Flomax   LOS: 1 day   Edra Riccardi D 11/24/2011, 10:51 AM

## 2011-11-24 NOTE — Progress Notes (Signed)
   CARE MANAGEMENT NOTE 11/24/2011  Patient:  [facility], [facility]   Account Number:  [id number]  Date Initiated:  11/24/2011  Documentation initiated by:  Jiles Crocker  Subjective/Objective Assessment:   ADMITTED WITH SOB, ANEMIA     Action/Plan:   PCP IS DR Earl Gala; LIVES ALONE   Anticipated DC Date:  12/01/2011   Anticipated DC Plan:  HOME W HOME HEALTH SERVICES          Status of service:  In process, will continue to follow Medicare Important Message given?   (If response is "NO", the following Medicare IM given date fields will be blank)  Per UR Regulation:  Reviewed for med. necessity/level of care/duration of stay  Comments:  11/24/2011- B Boluwatife Flight RN, BSN, MHA

## 2011-11-24 NOTE — Progress Notes (Signed)
INITIAL ADULT NUTRITION ASSESSMENT Date: 11/24/2011   Time: 12:02 PM Reason for Assessment: Nutrition Risk for unintentional weight loss > 10 lb over 1 month  ASSESSMENT: Male 78 y.o.  Dx: Normocytic anemia  Hx:  Past Medical History  Diagnosis Date  . Hypertension   . A-fib   . Anemia   . Pneumonia April 2013  . COPD (chronic obstructive pulmonary disease) dx April 2013  . Mitral valve prolapse     Related Meds:  Scheduled Meds:   . acetaminophen  650 mg Oral Once  . albuterol  5 mg Nebulization Once  . amiodarone  200 mg Oral Daily  . cefTRIAXone (ROCEPHIN)  IV  1 g Intravenous Once  . cholecalciferol  1,000 Units Oral Daily  . digoxin  125 mcg Oral Daily  . ipratropium  0.5 mg Nebulization Once  . irbesartan  150 mg Oral Daily  . omega-3 acid ethyl esters  1 g Oral Daily  . ondansetron (ZOFRAN) IV  4 mg Intravenous Once  . predniSONE  60 mg Oral Once  . Tamsulosin HCl  0.4 mg Oral Daily  . DISCONTD: sodium chloride   Intravenous STAT  . DISCONTD: cefTRIAXone (ROCEPHIN)  IV  1 g Intravenous Q24H  . DISCONTD: fish oil-omega-3 fatty acids  1 g Oral Daily   Continuous Infusions:   . DISCONTD: sodium chloride 75 mL/hr at 11/24/11 0058   PRN Meds:.acetaminophen, acetaminophen, alum & mag hydroxide-simeth, HYDROcodone-acetaminophen, ondansetron (ZOFRAN) IV, ondansetron, polyethylene glycol   Ht: 5\' 10"  (177.8 cm)  Wt: 145 lb 15.1 oz (66.2 kg)  Ideal Wt: 75.45 kg % Ideal Wt: 87.3%  Usual Wt: 155 lb. Per patient in January 2013.  % Usual Wt: 93.5%  *10 lb weight loss over 4 months. 6.45% weight loss from baseline.   Body mass index is 20.94 kg/(m^2). (WNL)  Food/Nutrition Related Hx: Patient reported poor appetite up until this morning. He reported he enjoys the food here, ate 100% at breakfast meal and already ordered lunch. Patient appears thin. Patient reported he has not been eating well over the past few years since his wife past away. Patient reported he is  not much of a snacker. He agreed to try [employer] supplement.   Labs:  CMP     Component Value Date/Time   NA 135 11/24/2011 0153   K 4.0 11/24/2011 0153   CL 100 11/24/2011 0153   CO2 24 11/24/2011 0153   GLUCOSE 157* 11/24/2011 0153   BUN 27* 11/24/2011 0153   CREATININE 1.76* 11/24/2011 0153   CALCIUM 9.9 11/24/2011 0153   PROT 5.5* 11/23/2011 1445   ALBUMIN 3.5 11/23/2011 1445   AST 17 11/23/2011 1445   ALT 14 11/23/2011 1445   ALKPHOS 109 11/23/2011 1445   BILITOT 1.4* 11/23/2011 1445   GFRNONAA 35* 11/24/2011 0153   GFRAA 40* 11/24/2011 0153    Intake/Output Summary (Last 24 hours) at 11/24/11 1208 Last data filed at 11/24/11 0900  Gross per 24 hour  Intake 1057.5 ml  Output    700 ml  Net  357.5 ml    Diet Order: General  Supplements/Tube Feeding: none at this time  IVF:    DISCONTD: sodium chloride Last Rate: 75 mL/hr at 11/24/11 0058    Estimated Nutritional Needs:   Kcal: 0960-4540 Protein: 82.9-98 Fluid: 1 ml per kcal  NUTRITION DIAGNOSIS: -Inadequate oral intake (NI-2.1).  Status: Ongoing  RELATED TO: poor appetite and unintentional weight loss  AS EVIDENCE BY: patient reports history  of poor appetite and unintentional weight loss of 10 lb over 4 months.   MONITORING/EVALUATION(Goals): Weight trends, labs, PO intake 1. PO intake > 75% at meals and supplements 2. Minimize weight loss  EDUCATION NEEDS: -No education needs identified at this time  INTERVENTION: 1. Will order patient [employer] nutrition supplement once daily to promote increased intake of calories and protein. Provides 250 kcal and 9 grams of protein.  2. RD to follow for nutrition plan of care.   Dietitian [phone]  DOCUMENTATION CODES Per approved criteria  -Not Applicable    Iven Finn [facility] 11/24/2011, 12:02 PM

## 2011-11-24 NOTE — Consult Note (Signed)
[facility]  Reason for Consult: Anemia  Primary Physician: Dr. Earl Doyle Cards: Dr. Alanda Doyle  [name] is an 78 y.o.  white male with multiple medical issues listed below, admitted on 11/23/2011 with 2 week history of progressive fatigue  following recent pneumonia . He was found to have severe anemia with a Hb 6.8 and Hct 19.8 requiring transfusion of 2 units of blood with improvement in counts and in symptoms, today at 8.8 and 23.9. His WBC was 4.4 and MCV 97.5. His platelets were 124,000 (splenomegaly incidentally noted in abdominal ultrasound). Of Doyle, ANC was 1.6. His creatinine  was 1.93, with Calcium of 10.7. His T bili was 1.4. Hemoccult 1 of 3  was negative. LDH was normal at 210. SPEP and UPEP with immunofixation are pending. Smear ordered for review. Anemia panel was taken after transfusion, with the following results: B12 177, Folate 14.3,Ferritin 331, TIBC 220, Iron 137, and retic 2.9. Hapto pending.  Of Doyle, patient reports that since around February 2013 he has been in a double-blind study at [facility] receiving "injections to increase muscle mass". He has received about 3 a month, likely total of 9 injections. He states that prior to the study, labs were drawn showing mild anemia. Daughter is to bring all the pertinent records from [facility] for review.  He feels better after the red cell transfusion.    PMH: Past Medical History  Diagnosis Date  . Hypertension   . A-fib   . Anemia   . Pneumonia April 2013  . COPD (chronic obstructive pulmonary disease) found during workup for pneumonia dx April 2013  . Mitral valve prolapse      Acute Renal Insufficiency   Surgeries: Past Surgical History  Procedure Date  . Cholecystectomy 1980's  . Chest tube insertion 1990's    fell  thru deck, punctured lung, chest tube  . Tonsillectomy   . Circumcision age 57    Allergies: No Known Allergies  Medications:  Prior to  Admission:  Prescriptions prior to admission  Medication Sig Dispense Refill  . amiodarone (PACERONE) 200 MG tablet Take 200 mg by mouth daily.      . candesartan (ATACAND) 8 MG tablet Take 8 mg by mouth daily.      . cholecalciferol (VITAMIN D) 1000 UNITS tablet Take 1,000 Units by mouth daily.      . clopidogrel (PLAVIX) 75 MG tablet Take 75 mg by mouth daily.      . digoxin (LANOXIN) 0.125 MG tablet Take 125 mcg by mouth daily.      . fish oil-omega-3 fatty acids 1000 MG capsule Take 1 g by mouth daily.       He does take baby aspirin daily  WUJ:[id number], acetaminophen, alum & mag hydroxide-simeth, HYDROcodone-acetaminophen, ondansetron (ZOFRAN) IV, ondansetron, polyethylene glycol  ROS: Constitutional: Positive for 10 lb weight loss over the last month. Negative for fever at this time, but did have fever and chills when diagnosed with pneumonia,  and positive for  Malaise/fatigue over the last 2 weeks.  Eyes: Negative for blurred vision and double vision.  Respiratory: dry cough,  No hemoptysis and  No significant shortness of breath.  Cardiovascular: Negative for chest pain. GI: Had 24 hrs history of nausea prior to his admission, 2 episodes of vomiting 2-3 weeks ago, no diarrhea or constipation. No change in bowel caliber. No  Melena or hematochezia.  GU: No blood in urine. No loss of urinary control, but has  nocturia, up to 6 episodes. Dysuria upon admission with  cultures pending. Skin: Negative for itching. No rash. No petechia. Mild bruising, not new.   Neurological: No headaches. No motor deficits. He has intermittent neuropathy on hands and feet , some tremors since the study began.  No prior history of blood transfusions.   Family History:  Family History  Problem Relation Age of Onset  . Cancer Father   . Heart attack Father   . Atrial fibrillation Mother    No family history of blood dyscrasia.  Social History:  reports that he has never smoked. He has never used  smokeless tobacco. He reports that he does not drink alcohol or use illicit drugs. He is widowed for 3 years, 2 grown children. He lives alone, independent of ADLs and ambulation. [facility] (Daugher): [phone] is main contact . Retired from [employer] where he worked for 35 years.    Physical Exam  78 year old  in no acute distress A. and O. X3. He looks much younger than his stated age.  General well-developed and well-nourished  HEENT: Normocephalic, atraumatic,conjunctiva pale.  PERRLA. Sclera anicteric.  Oral cavity without thrush or lesions. Neck supple. no thyromegaly, no cervical or supraclavicular adenopathy  Lungs clear bilaterally . No wheezing, rhonchi or rales. No axillary masses. Breasts: not examined. Cardiac regular rate and rhythm normal S1-S2, no murmur , rubs or gallops Abdomen soft nontender , bowel sounds x4. No HSM GU/rectal: negative for scrotal masses or inguinal lymph nodes.  Extremities no clubbing cyanosis or edema. Few small ecchymoses over the extremities     Labs:  CBC   Lab 11/24/11 0153 11/23/11 1445  WBC 4.1 4.4  HGB 8.3* 6.8*  HCT 23.9* 19.8*  PLT 103* 124*  MCV 90.5 97.5  MCH 31.4 33.5  MCHC 34.7 34.3  RDW 19.8* 16.2*  LYMPHSABS -- 1.7  MONOABS -- 0.5  EOSABS -- 0.6  BASOSABS -- 0.0  BANDABS -- --   Blood smear: 11/24/2011-there is a moderate number of ovalocytes, a few teardrops, and a rare target cell. The polychromasia is not increased. The platelets appear normal in number and are generally small. The white cell morphology is remarkable for hypogranular neutrophils. No blasts.  Anemia panel:   [facility] 11/24/11 0153  VITAMINB12 177*  FOLATE 14.3  FERRITIN 331*  TIBC 220  IRON 137*  RETICCTPCT 2.9     Basename 11/24/11 0153  TSH 1.008  T4TOTAL --  T3FREE --  THYROIDAB --     CMP    Lab 11/24/11 0153 11/23/11 1445  NA 135 135  K 4.0 3.8  CL 100 99  CO2 24 24  GLUCOSE 157* 100*  BUN 27* 22  CREATININE 1.76* 1.93*    CALCIUM 9.9 10.7*  MG -- --  AST -- 17  ALT -- 14  ALKPHOS -- 109  BILITOT -- 1.4*        Component Value Date/Time   BILITOT 1.4* 11/23/2011 1445      Imaging Studies:  Dg Chest 2 View  11/23/2011  *RADIOLOGY REPORT*  Clinical Data: Acute mental status change.  SOB.  CHEST - 2 VIEW  Comparison: None  Findings: The heart size appears normal.  There is no pleural effusion or edema.  Biapical scarring is identified.  Scar like density is noted in the right lung base.  The lungs appear hyperinflated and there is chronic interstitial coarsening.  No focal airspace consolidation noted.  IMPRESSION:  1.  No acute cardiopulmonary abnormalities. 2.  Suspect COPD.  Original Report Authenticated By: Rosealee Albee, M.D.   US Renal  11/23/2011  *RADIOLOGY REPORT*  Clinical Data: Evaluate for obstructive uropathy in patient with acute renal failure  RENAL/URINARY TRACT ULTRASOUND COMPLETE  Comparison:  Abdominal CT - 01/10/2008; chest radiograph - earlier same day  Findings:  Right Kidney:  Normal cortical thickness, echogenicity and size, measuring 10.5 cm in length.  No focal renal lesions.  No echogenic renal stones. Doyle is again made of an extrarenal pelvis.  No urinary obstruction.  Left Kidney:  Normal cortical thickness, echogenicity and size, measuring 10.3 cm in length.  No focal renal lesions.  No echogenic renal stones.  No urinary obstruction.  Bladder:  There is incomplete emptying of the urinary bladder. Bladder is otherwise normal.  The prostate again appears borderline enlarged with mass effect upon the undersurface of the bladder.  The spleen is enlarged measuring 14.5 x 9.3 x 12.8 cm (869 cc). Small right-sided pleural effusion.  IMPRESSION: 1.  No evidence of hydronephrosis. 2.  The urinary bladder is distended with incomplete voiding, likely secondary to an enlarged prostate. 3.  Doyle is made of a small right-sided pleural effusion. 4.  Splenomegaly incidentally noted.  Original Report  Authenticated By: Waynard Reeds, M.D.        A/P: 78 y.o. male asked to see for evaluation of anemia in the setting of recent infection, plus minus recent double blind study drug use. possibility of bone marrow disease should be entertained, in view of mild renal insufficiency, anemia and mild hypercalcemia.   Smear to be reviewed for rouleaux or other abnormalities..  Dr. Truett Perna is to see the patient following this consult and an addendum to be written. Will need the records from the [facility] to be faxed to chart as soon as possible for review of labs. Thank you for the referral .  Impression: 1. Normocytic anemia 2. Thrombocytopenia 3. Splenomegaly on a renal ultrasound 4. History of atrial fibrillation 5. Recent "pneumonia " 6. Renal insufficiency  He was admitted with severe symptomatic anemia. The differential diagnosis includes multiple myeloma, myelodysplasia, and a lymphoproliferative disorder. The anemia could be related to renal insufficiency.  Recommendations: 1. Check serum protein electrophoresis, serum immunofixation, serum free light chains, and quantitative immunoglobulin levels 2. Proceed with a diagnostic bone marrow biopsy with samples to be sent for flow cytometry and cytogenetics 3. We will followup on previous CBC data and records regarding the clinical trial at [facility] 4. Discharge when stable per the medicine service with outpatient followup at the [facility].  [place] [facility] E 11/24/2011 11:38 AM

## 2011-11-25 ENCOUNTER — Telehealth: Payer: Self-pay | Admitting: Oncology

## 2011-11-25 ENCOUNTER — Inpatient Hospital Stay (HOSPITAL_COMMUNITY): Payer: Medicare Other

## 2011-11-25 ENCOUNTER — Encounter (HOSPITAL_COMMUNITY): Payer: Self-pay | Admitting: Radiology

## 2011-11-25 DIAGNOSIS — R161 Splenomegaly, not elsewhere classified: Secondary | ICD-10-CM

## 2011-11-25 DIAGNOSIS — N19 Unspecified kidney failure: Secondary | ICD-10-CM

## 2011-11-25 DIAGNOSIS — D696 Thrombocytopenia, unspecified: Secondary | ICD-10-CM

## 2011-11-25 DIAGNOSIS — D649 Anemia, unspecified: Secondary | ICD-10-CM

## 2011-11-25 MED ORDER — FENTANYL CITRATE 0.05 MG/ML IJ SOLN
INTRAMUSCULAR | Status: AC | PRN
  Administered 2011-11-25: 50 ug via INTRAVENOUS

## 2011-11-25 MED ORDER — TAMSULOSIN HCL 0.4 MG PO CAPS: 0.4000 mg | ORAL_CAPSULE | Freq: Every day | ORAL | Status: DC

## 2011-11-25 MED ORDER — CIPROFLOXACIN HCL 250 MG PO TABS
250.0000 mg | ORAL_TABLET | Freq: Two times a day (BID) | ORAL | Status: DC
  Administered 2011-11-25: 250 mg via ORAL
  Filled 2011-11-25 (×3): qty 1

## 2011-11-25 MED ORDER — MIDAZOLAM HCL 5 MG/5ML IJ SOLN
INTRAMUSCULAR | Status: AC | PRN
  Administered 2011-11-25: 2 mg via INTRAVENOUS

## 2011-11-25 MED ORDER — CIPROFLOXACIN HCL 250 MG PO TABS: 250.0000 mg | ORAL_TABLET | Freq: Two times a day (BID) | ORAL | Status: AC

## 2011-11-25 NOTE — Discharge Summary (Addendum)
Physician Discharge Summary  Patient ID: Thomas Doyle MRN: [id number] DOB/AGE: [birth year] 76 y.o.  Admit date: 11/23/2011 Discharge date: 11/25/2011  Admission Diagnoses:  Discharge Diagnoses:  Principal Problem:  *Normocytic anemia Active Problems:  Thrombocytopenia  Acute renal failure  Dehydration  History of atrial fibrillation  Hyperbilirubinemia  Hypertension  COPD (chronic obstructive pulmonary disease)  UTI (lower urinary tract infection)   Discharged Condition: fair  Hospital Course:  Patient presented to the hospital with complaint of fatigue, in the hospital he was found to have abnormal UA and anemia. His Hemoccult was negative. Admission was deemed necessary for further evaluation and treatment. Please see dictated H&P for further details. Past medical history medications social history past surgical history allergies family history per patient H&P. As stated patient's Hemoccult was negative. There was concern for hematologic cause of anemia. SPEP UPEP was obtained, results pending, patient is scheduled for a bone marrow biopsy the day of this dictation. He was seen in consultation by hematology. Current differential diagnoses includes myelodysplastic syndrome versus lymphoma versus myeloma kidney. Patient received 2 units of packed red blood cells, he feels tremendously better after transfusion. Patient urine culture did show he UTI, he will continue a course of Cipro. He did receive IV Rocephin times one while hospitalized. He is currently afebrile without leukocytosis. Patient's ultrasound did show some retained urine in the bladder and he was having urinary symptoms therefore Flomax was added. He's probably having incomplete emptying of his bladder which led to the UTI.  Consults:  hematology Dr.Sherrill Interventional radiology for bone marrow biopsy Significant Diagnostic Studies:Dg Chest 2 View  11/23/2011  *RADIOLOGY REPORT*  Clinical Data: Acute mental status  change.  SOB.  CHEST - 2 VIEW  Comparison: None  Findings: The heart size appears normal.  There is no pleural effusion or edema.  Biapical scarring is identified.  Scar like density is noted in the right lung base.  The lungs appear hyperinflated and there is chronic interstitial coarsening.  No focal airspace consolidation noted.  IMPRESSION:  1.  No acute cardiopulmonary abnormalities. 2.  Suspect COPD.  Original Report Authenticated By: Rosealee Albee, M.D.   US Renal  11/23/2011  *RADIOLOGY REPORT*  Clinical Data: Evaluate for obstructive uropathy in patient with acute renal failure  RENAL/URINARY TRACT ULTRASOUND COMPLETE  Comparison:  Abdominal CT - 01/10/2008; chest radiograph - earlier same day  Findings:  Right Kidney:  Normal cortical thickness, echogenicity and size, measuring 10.5 cm in length.  No focal renal lesions.  No echogenic renal stones. Note is again made of an extrarenal pelvis.  No urinary obstruction.  Left Kidney:  Normal cortical thickness, echogenicity and size, measuring 10.3 cm in length.  No focal renal lesions.  No echogenic renal stones.  No urinary obstruction.  Bladder:  There is incomplete emptying of the urinary bladder. Bladder is otherwise normal.  The prostate again appears borderline enlarged with mass effect upon the undersurface of the bladder.  The spleen is enlarged measuring 14.5 x 9.3 x 12.8 cm (869 cc). Small right-sided pleural effusion.  IMPRESSION: 1.  No evidence of hydronephrosis. 2.  The urinary bladder is distended with incomplete voiding, likely secondary to an enlarged prostate. 3.  Note is made of a small right-sided pleural effusion. 4.  Splenomegaly incidentally noted.  Original Report Authenticated By: Waynard Reeds, M.D.    Labs Results for orders placed during the hospital encounter of 11/23/11 (from the past 24 hour(s))   CBC Status: Abnormal  Collection Time    11/23/11 2:45 PM   Component  Value  Range    WBC  4.4  4.0 - 10.5 (K/uL)      RBC  2.03 (*)  4.22 - 5.81 (MIL/uL)    Hemoglobin  6.8 (*)  13.0 - 17.0 (g/dL)    HCT  16.1 (*)  09.6 - 52.0 (%)    MCV  97.5  78.0 - 100.0 (fL)    MCH  33.5  26.0 - 34.0 (pg)    MCHC  34.3  30.0 - 36.0 (g/dL)    RDW  04.5 (*)  40.9 - 15.5 (%)    Platelets  124 (*)  150 - 400 (K/uL)   DIFFERENTIAL Status: Abnormal    Collection Time    11/23/11 2:45 PM   Component  Value  Range    Neutrophils Relative  36 (*)  43 - 77 (%)    Neutro Abs  1.6 (*)  1.7 - 7.7 (K/uL)    Lymphocytes Relative  38  12 - 46 (%)    Lymphs Abs  1.7  0.7 - 4.0 (K/uL)    Monocytes Relative  11  3 - 12 (%)    Monocytes Absolute  0.5  0.1 - 1.0 (K/uL)    Eosinophils Relative  14 (*)  0 - 5 (%)    Eosinophils Absolute  0.6  0.0 - 0.7 (K/uL)    Basophils Relative  1  0 - 1 (%)    Basophils Absolute  0.0  0.0 - 0.1 (K/uL)   COMPREHENSIVE METABOLIC PANEL Status: Abnormal    Collection Time    11/23/11 2:45 PM   Component  Value  Range    Sodium  135  135 - 145 (mEq/L)    Potassium  3.8  3.5 - 5.1 (mEq/L)    Chloride  99  96 - 112 (mEq/L)    CO2  24  19 - 32 (mEq/L)    Glucose, Bld  100 (*)  70 - 99 (mg/dL)    BUN  22  6 - 23 (mg/dL)    Creatinine, Ser  8.11 (*)  0.50 - 1.35 (mg/dL)    Calcium  91.4 (*)  8.4 - 10.5 (mg/dL)    Total Protein  5.5 (*)  6.0 - 8.3 (g/dL)    Albumin  3.5  3.5 - 5.2 (g/dL)    AST  17  0 - 37 (U/L)    ALT  14  0 - 53 (U/L)    Alkaline Phosphatase  109  39 - 117 (U/L)    Total Bilirubin  1.4 (*)  0.3 - 1.2 (mg/dL)    GFR calc non Af Amer  31 (*)  >90 (mL/min)    GFR calc Af Amer  36 (*)  >90 (mL/min)   LIPASE, BLOOD Status: Abnormal    Collection Time    11/23/11 2:45 PM   Component  Value  Range    Lipase  8 (*)  11 - 59 (U/L)   URINALYSIS, ROUTINE W REFLEX MICROSCOPIC Status: Abnormal    Collection Time    11/23/11 3:32 PM   Component  Value  Range    Color, Urine  YELLOW  YELLOW    APPearance  CLOUDY (*)  CLEAR    Specific Gravity, Urine  1.019  1.005 - 1.030    pH  7.0  5.0 -  8.0    Glucose, UA  NEGATIVE  NEGATIVE (mg/dL)  Hgb urine dipstick  NEGATIVE  NEGATIVE    Bilirubin Urine  NEGATIVE  NEGATIVE    Ketones, ur  NEGATIVE  NEGATIVE (mg/dL)    Protein, ur  NEGATIVE  NEGATIVE (mg/dL)    Urobilinogen, UA  1.0  0.0 - 1.0 (mg/dL)    Nitrite  NEGATIVE  NEGATIVE    Leukocytes, UA  MODERATE (*)  NEGATIVE   URINE MICROSCOPIC-ADD ON Status: Abnormal    Collection Time    11/23/11 3:32 PM   Component  Value  Range    Squamous Epithelial / LPF  RARE  RARE    WBC, UA  7-10  <3 (WBC/hpf)    Bacteria, UA  FEW (*)  RARE    Crystals  CA OXALATE CRYSTALS (*)  NEGATIVE   OCCULT BLOOD, POC DEVICE Status: Normal    Collection Time    11/23/11 3:43 PM   Component  Value  Range    Fecal Occult Bld  NEGATIVE    TYPE AND SCREEN Status: Normal    Collection Time    11/23/11 3:50 PM   Component  Value  Range    ABO/RH(D)  O POS     Antibody Screen  NEG     Sample Expiration  11/26/2011     Unit Number  [id number]     Blood Component Type  RED CELLS,LR     Unit division  00     Status of Unit  ISSUED,FINAL     Transfusion Status  OK TO TRANSFUSE     Crossmatch Result  Compatible     Unit Number  [id number]     Blood Component Type  RBC LR PHER1     Unit division  00     Status of Unit  ISSUED,FINAL     Transfusion Status  OK TO TRANSFUSE     Crossmatch Result  Compatible    ABO/RH Status: Normal    Collection Time    11/23/11 3:50 PM   Component  Value  Range    ABO/RH(D)  O POS    PREPARE RBC (CROSSMATCH) Status: Normal    Collection Time    11/23/11 4:00 PM   Component  Value  Range    Order Confirmation  ORDER PROCESSED BY BLOOD BANK    LACTATE DEHYDROGENASE Status: Normal    Collection Time    11/24/11 1:53 AM   Component  Value  Range    LDH  210  94 - 250 (U/L)   CBC Status: Abnormal    Collection Time    11/24/11 1:53 AM   Component  Value  Range    WBC  4.1  4.0 - 10.5 (K/uL)    RBC  2.64 (*)  4.22 - 5.81 (MIL/uL)    Hemoglobin  8.3 (*)  13.0 - 17.0 (g/dL)      HCT  13.0 (*)  86.5 - 52.0 (%)    MCV  90.5  78.0 - 100.0 (fL)    MCH  31.4  26.0 - 34.0 (pg)    MCHC  34.7  30.0 - 36.0 (g/dL)    RDW  78.4 (*)  69.6 - 15.5 (%)    Platelets  103 (*)  150 - 400 (K/uL)   BASIC METABOLIC PANEL Status: Abnormal    Collection Time    11/24/11 1:53 AM   Component  Value  Range    Sodium  135  135 - 145 (mEq/L)    Potassium  4.0  3.5 -  5.1 (mEq/L)    Chloride  100  96 - 112 (mEq/L)    CO2  24  19 - 32 (mEq/L)    Glucose, Bld  157 (*)  70 - 99 (mg/dL)    BUN  27 (*)  6 - 23 (mg/dL)    Creatinine, Ser  1.61 (*)  0.50 - 1.35 (mg/dL)    Calcium  9.9  8.4 - 10.5 (mg/dL)    GFR calc non Af Amer  35 (*)  >90 (mL/min)    GFR calc Af Amer  40 (*)  >90 (mL/min)   RETICULOCYTES Status: Abnormal    Collection Time    11/24/11 1:53 AM   Component  Value  Range    Retic Ct Pct  2.9  0.4 - 3.1 (%)    RBC.  2.66 (*)  4.22 - 5.81 (MIL/uL)    Retic Count, Manual  77.1  19.0 - 186.0 (K/uL)        Urine culture [[id number]] Collection: 11/23/11 1901 Resulted: 11/25/11 0105 Specimen Type: Urine Specimen Source: Urine, Clean Catch Specimen Description Result: URINE, CLEAN CATCH Special Requests Normal Culture Setup Time [id number] Colony Count Result: >=100,000 COLONIES/ML Culture Result: ESCHERICHIA COLI   Discharge Exam: Blood pressure 148/60, pulse 69, temperature 99.1 F (37.3 C), temperature source Oral, resp. rate 20, height 5\' 10"  (1.778 m), weight 66.2 kg (145 lb 15.1 oz), SpO2 99.00%. General appearance: alert and cooperative Resp: clear to auscultation bilaterally Cardio: regular rate and rhythm, S1, S2 normal, no murmur, click, rub or gallop GI: soft, non-tender; bowel sounds normal; no masses,  no organomegaly Extremities: extremities normal, atraumatic, no cyanosis or edema  Disposition: 01-Home or Self Care  Discharge Orders    Future Appointments: Provider: Department: Dept Phone: Center:   11/25/2011 3:40 PM [facility] [phone] [place]  [facility]   11/30/2011 10:30 AM Delcie Roch [facility] [phone] None   11/30/2011 10:30 AM [facility] [phone] None   11/30/2011 11:00 AM Ladene Artist, MD [facility] [phone] None     Medication List  As of 11/25/2011 10:59 AM   TAKE these medications         amiodarone 200 MG tablet   Commonly known as: PACERONE   Take 200 mg by mouth daily.      candesartan 8 MG tablet   Commonly known as: ATACAND   Take 8 mg by mouth daily.      cholecalciferol 1000 UNITS tablet   Commonly known as: VITAMIN D   Take 1,000 Units by mouth daily.      ciprofloxacin 250 MG tablet   Commonly known as: CIPRO   Take 1 tablet (250 mg total) by mouth 2 (two) times daily.      clopidogrel 75 MG tablet   Commonly known as: PLAVIX   Take 75 mg by mouth daily.      digoxin 0.125 MG tablet   Commonly known as: LANOXIN   Take 125 mcg by mouth daily.      fish oil-omega-3 fatty acids 1000 MG capsule   Take 1 g by mouth daily.      Tamsulosin HCl 0.4 MG Caps   Commonly known as: FLOMAX   Take 1 capsule (0.4 mg total) by mouth daily.           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  SignedRenford Dills D 11/25/2011, 10:59 AM

## 2011-11-25 NOTE — Procedures (Signed)
CT guided bone marrow aspirates and core biopsies.  2 cores obtained.  No immediate complication.

## 2011-11-25 NOTE — ED Notes (Signed)
Incision made

## 2011-11-25 NOTE — Telephone Encounter (Signed)
Gv copy of appt to MD to gv to pt in hospital

## 2011-11-25 NOTE — Progress Notes (Signed)
IP PROGRESS NOTE  Subjective:   He feels better after the red cell transfusion. He is ambulating.  Objective: Vital signs in last 24 hours: Blood pressure 148/60, pulse 69, temperature 99.1 F (37.3 C), temperature source Oral, resp. rate 20, height 5\' 10"  (1.778 m), weight 145 lb 15.1 oz (66.2 kg), SpO2 99.00%.  Intake/Output from previous day: 05/02 0701 - 05/03 0700 In: 960 [P.O.:960] Out: 1790 [Urine:1790]  Physical Exam:  Lungs: Clear bilaterally Cardiac: Regular rate and rhythm Abdomen: I cannot feel the spleen Extremities: No leg edema  Lab Results:  Basename 11/24/11 0153 11/23/11 1445  WBC 4.1 4.4  HGB 8.3* 6.8*  HCT 23.9* 19.8*  PLT 103* 124*    BMET  Basename 11/24/11 0153 11/23/11 1445  NA 135 135  K 4.0 3.8  CL 100 99  CO2 24 24  GLUCOSE 157* 100*  BUN 27* 22  CREATININE 1.76* 1.93*  CALCIUM 9.9 10.7*    Studies/Results: Dg Chest 2 View  11/23/2011  *RADIOLOGY REPORT*  Clinical Data: Acute mental status change.  SOB.  CHEST - 2 VIEW  Comparison: None  Findings: The heart size appears normal.  There is no pleural effusion or edema.  Biapical scarring is identified.  Scar like density is noted in the right lung base.  The lungs appear hyperinflated and there is chronic interstitial coarsening.  No focal airspace consolidation noted.  IMPRESSION:  1.  No acute cardiopulmonary abnormalities. 2.  Suspect COPD.  Original Report Authenticated By: Rosealee Albee, M.D.   US Renal  11/23/2011  *RADIOLOGY REPORT*  Clinical Data: Evaluate for obstructive uropathy in patient with acute renal failure  RENAL/URINARY TRACT ULTRASOUND COMPLETE  Comparison:  Abdominal CT - 01/10/2008; chest radiograph - earlier same day  Findings:  Right Kidney:  Normal cortical thickness, echogenicity and size, measuring 10.5 cm in length.  No focal renal lesions.  No echogenic renal stones. Note is again made of an extrarenal pelvis.  No urinary obstruction.  Left Kidney:  Normal  cortical thickness, echogenicity and size, measuring 10.3 cm in length.  No focal renal lesions.  No echogenic renal stones.  No urinary obstruction.  Bladder:  There is incomplete emptying of the urinary bladder. Bladder is otherwise normal.  The prostate again appears borderline enlarged with mass effect upon the undersurface of the bladder.  The spleen is enlarged measuring 14.5 x 9.3 x 12.8 cm (869 cc). Small right-sided pleural effusion.  IMPRESSION: 1.  No evidence of hydronephrosis. 2.  The urinary bladder is distended with incomplete voiding, likely secondary to an enlarged prostate. 3.  Note is made of a small right-sided pleural effusion. 4.  Splenomegaly incidentally noted.  Original Report Authenticated By: Waynard Reeds, M.D.    Medications: I have reviewed the patient's current medications  Impression: 1. Normocytic anemia 2. Thrombocytopenia 3. Splenomegaly noted on a renal ultrasound 4. Recent diagnosis of "pneumonia " 5. Renal insufficiency 6. History of a fibrillation 7. Enrolled on a [facility] strength study with [place] [id number]   I reviewed records from the [facility] strength study. His hemoglobin returned at 10.4 on 08/30/2011 on the day he signed the study consent. I think it is unlikely the [facility] study drug is contributing to the hematologic abnormalities. The differential diagnosis for the hematologic findings include myelodysplasia, multiple myeloma, and a lymphoproliferative disorder.  Recommendations: 1. Proceed with a diagnostic bone marrow biopsy today with aspirate samples to be sent for flow cytometry and cytogenetics analysis 2.  A followup appointment has been scheduled at the cancer Center for 11/30/2011 3. Discharge plans per the internal medicine service.   LOS: 2 days   Ivar Domangue  11/25/2011, 8:56 AM

## 2011-11-25 NOTE — H&P (Signed)
Cc: Patient presents today for bone marrow needle core biopsy - ? Myeloma, ?MSD, ?lymphoma, ?anemia  HPI: See note below from oncology today :   Subjective:    He feels better after the red cell transfusion. He is ambulating.  Objective: Vital signs in last 24 hours: Blood pressure 148/60, pulse 69, temperature 99.1 F (37.3 C), temperature source Oral, resp. rate 20, height 5\' 10"  (1.778 m), weight 145 lb 15.1 oz (66.2 kg), SpO2 99.00%.  Intake/Output from previous day: 05/02 0701 - 05/03 0700 In: 960 [P.O.:960] Out: 1790 [Urine:1790]  Physical Exam:  Lungs: Clear bilaterally Cardiac: Regular rate and rhythm Abdomen: I cannot feel the spleen Extremities: No leg edema  Lab Results:  Basename  11/24/11 0153  11/23/11 1445   WBC  4.1  4.4   HGB  8.3*  6.8*   HCT  23.9*  19.8*   PLT  103*  124*     BMET  Basename  11/24/11 0153  11/23/11 1445   NA  135  135   K  4.0  3.8   CL  100  99   CO2  24  24   GLUCOSE  157*  100*   BUN  27*  22   CREATININE  1.76*  1.93*   CALCIUM  9.9  10.7*     Studies/Results: Dg Chest 2 View  11/23/2011  *RADIOLOGY REPORT*  Clinical Data: Acute mental status change.  SOB.  CHEST - 2 VIEW  Comparison: None  Findings: The heart size appears normal.  There is no pleural effusion or edema.  Biapical scarring is identified.  Scar like density is noted in the right lung base.  The lungs appear hyperinflated and there is chronic interstitial coarsening.  No focal airspace consolidation noted.  IMPRESSION:  1.  No acute cardiopulmonary abnormalities. 2.  Suspect COPD.  Original Report Authenticated By: Rosealee Albee, M.D.   US Renal  11/23/2011  *RADIOLOGY REPORT*  Clinical Data: Evaluate for obstructive uropathy in patient with acute renal failure  RENAL/URINARY TRACT ULTRASOUND COMPLETE  Comparison:  Abdominal CT - 01/10/2008; chest radiograph - earlier same day  Findings:  Right Kidney:  Normal cortical thickness, echogenicity and size,  measuring 10.5 cm in length.  No focal renal lesions.  No echogenic renal stones. Note is again made of an extrarenal pelvis.  No urinary obstruction.  Left Kidney:  Normal cortical thickness, echogenicity and size, measuring 10.3 cm in length.  No focal renal lesions.  No echogenic renal stones.  No urinary obstruction.  Bladder:  There is incomplete emptying of the urinary bladder. Bladder is otherwise normal.  The prostate again appears borderline enlarged with mass effect upon the undersurface of the bladder.  The spleen is enlarged measuring 14.5 x 9.3 x 12.8 cm (869 cc). Small right-sided pleural effusion.  IMPRESSION: 1.  No evidence of hydronephrosis. 2.  The urinary bladder is distended with incomplete voiding, likely secondary to an enlarged prostate. 3.  Note is made of a small right-sided pleural effusion. 4.  Splenomegaly incidentally noted.  Original Report Authenticated By: Waynard Reeds, M.D.     Medications: I have reviewed the patient's current medications  Impression: 1. Normocytic anemia 2. Thrombocytopenia 3. Splenomegaly noted on a renal ultrasound 4. Recent diagnosis of "pneumonia " 5. Renal insufficiency 6. History of a fibrillation 7. Enrolled on a [facility] strength study with [place] [id number]   I reviewed records from the [facility] strength study. His hemoglobin returned  at 10.4 on 08/30/2011 on the day he signed the study consent. I think it is unlikely the [facility] study drug is contributing to the hematologic abnormalities. The differential diagnosis for the hematologic findings include myelodysplasia, multiple myeloma, and a lymphoproliferative disorder.  Recommendations: 1. Proceed with a diagnostic bone marrow biopsy today with aspirate samples to be sent for flow cytometry and cytogenetics analysis 2. A followup appointment has been scheduled at the cancer Center for 11/30/2011 3. Discharge plans per the internal medicine service.  LOS: 2 days   SHERRILL,  GARY  11/25/2011, 8:56 AM  Plan : proceed with BM biopsy today, set up for 11 am for diagnosis establishment.  Patient has been talked with in regards to the procedure details risks and benefits with his apparent understanding.  Written consent obtained.

## 2011-11-26 LAB — URINE CULTURE
Colony Count: >=100000
Special Requests: NORMAL

## 2011-11-28 LAB — UIFE/LIGHT CHAINS/TP QN, 24-HR UR
Alpha 2, Urine: DETECTED — AB
Beta, Urine: DETECTED — AB
Free Kappa Lt Chains,Ur: 2.51 mg/dL — ABNORMAL HIGH (ref 0.14–2.42)
Gamma Globulin, Urine: DETECTED — AB

## 2011-11-28 LAB — KAPPA/LAMBDA LIGHT CHAINS: Kappa, lambda light chain ratio: 0.43 (ref 0.26–1.65)

## 2011-11-29 ENCOUNTER — Other Ambulatory Visit: Payer: Self-pay | Admitting: Oncology

## 2011-11-29 LAB — IMMUNOFIXATION ELECTROPHORESIS
IgA: 72 mg/dL (ref 68–379)
IgG (Immunoglobin G), Serum: 434 mg/dL — ABNORMAL LOW (ref 650–1600)
IgM, Serum: 17 mg/dL — ABNORMAL LOW (ref 41–251)
Total Protein ELP: 5.1 g/dL — ABNORMAL LOW (ref 6.0–8.3)

## 2011-11-29 LAB — PROTEIN ELECTROPHORESIS, SERUM
Beta 2: 3.1 % — ABNORMAL LOW (ref 3.2–6.5)
Gamma Globulin: 8.2 % — ABNORMAL LOW (ref 11.1–18.8)

## 2011-11-30 ENCOUNTER — Ambulatory Visit: Payer: Medicare Other

## 2011-11-30 ENCOUNTER — Telehealth: Payer: Self-pay | Admitting: *Deleted

## 2011-11-30 ENCOUNTER — Ambulatory Visit (HOSPITAL_BASED_OUTPATIENT_CLINIC_OR_DEPARTMENT_OTHER): Payer: Medicare Other | Admitting: Oncology

## 2011-11-30 ENCOUNTER — Ambulatory Visit (HOSPITAL_BASED_OUTPATIENT_CLINIC_OR_DEPARTMENT_OTHER): Payer: Medicare Other | Admitting: Lab

## 2011-11-30 ENCOUNTER — Other Ambulatory Visit: Payer: Medicare Other | Admitting: Lab

## 2011-11-30 VITALS — BP 180/68 | HR 78 | Temp 97.0°F | Ht 70.0 in | Wt 147.3 lb

## 2011-11-30 DIAGNOSIS — D649 Anemia, unspecified: Secondary | ICD-10-CM

## 2011-11-30 LAB — HOLD TUBE, BLOOD BANK

## 2011-11-30 LAB — CBC WITH DIFFERENTIAL/PLATELET
BASO%: 0.5 % (ref 0.0–2.0)
EOS%: 21 % — ABNORMAL HIGH (ref 0.0–7.0)
HCT: 27.6 % — ABNORMAL LOW (ref 38.4–49.9)
MCH: 30.8 pg (ref 27.2–33.4)
MCHC: 33.7 g/dL (ref 32.0–36.0)
NEUT%: 22.3 % — ABNORMAL LOW (ref 39.0–75.0)
lymph#: 2 10*3/uL (ref 0.9–3.3)

## 2011-11-30 NOTE — Progress Notes (Signed)
OFFICE PROGRESS NOTE   INTERVAL HISTORY:   He returns as scheduled. I saw him in the hospital last week after he presented with symptomatic anemia. He was transfused with packed red blood cells and felt better. He continues to feel well.  He underwent a bone marrow biopsy on 11/25/2011 and was discharged to home.  Objective:  Vital signs in last 24 hours:  Blood pressure 180/68, pulse 78, temperature 97 F (36.1 C), temperature source Oral, height 5\' 10"  (1.778 m), weight 147 lb 4.8 oz (66.815 kg).    HEENT: Neck without mass Lymphatics: No cervical, supraclavicular, axillary, or inguinal nodes Resp: Lungs with end inspiratory rails at the extreme posterior base bilaterally, no respiratory distress Cardio: Regular rate and rhythm GI: No hepatomegaly,? Palpable spleen tip a few fingers below the left costal margin Vascular: No leg edema  Lab Results:  Lab Results  Component Value Date   WBC 4.2 11/30/2011   HGB 9.3* 11/30/2011   HCT 27.6* 11/30/2011   MCV 91.4 11/30/2011   PLT 142 11/30/2011   ANC 0.9   Medications: I have reviewed the patient's current medications.  Assessment/Plan:  1. Normocytic anemia 2. Mild thrombocytopenia 3. Leukopenia/neutropenia 4. Recent "pneumonia " 5. Renal insufficiency 6. History of atrial fibrillation 7. Enrolling [facility] strength study with [id number]  Disposition:  He was admitted with symptomatic anemia on 11/23/2011. He was transfused with packed red blood cells and the hemoglobin is higher. A laboratory workup did not reveal a calls for the anemia. He underwent a diagnostic bone marrow biopsy on 11/25/2011. The final pathology report is pending. I discussed the case with the pathologist today. He indicates there is a bone marrow infiltrative "lymphocytes "and histiocytes. Flow cytometry did not confirm a clonal population. Additional immunohistochemical stains are pending and the case has been referred to [facility] for a second  opinion. There is no evidence of myelodysplasia or multiple myeloma. The differential diagnosis includes non-Hodgkin's lymphoma, Hodgkin's disease, and a "reactive "process.  I discussed the differential diagnosis with Mr. Furio and his daughter. He will return for an office visit on 12/07/2011. I will contact him by telephone when a final pathology report is available. We will plan systemic therapy as indicated based on the bone marrow pathology. He understands additional diagnostic evaluation may be needed.   Thornton Papas, MD  11/30/2011  1:40 PM

## 2011-11-30 NOTE — Telephone Encounter (Signed)
Called pt with lab results. HGB better, per Dr. Truett Perna. Pt verbalized understanding.

## 2011-12-02 LAB — PROTEIN ELECTROPH W RFLX QUANT IMMUNOGLOBULINS
Alpha-1-Globulin: 5.4 % — ABNORMAL HIGH (ref 2.9–4.9)
Beta 2: 4.2 % (ref 3.2–6.5)
Gamma Globulin: 8.1 % — ABNORMAL LOW (ref 11.1–18.8)

## 2011-12-06 ENCOUNTER — Other Ambulatory Visit: Payer: Self-pay | Admitting: *Deleted

## 2011-12-06 DIAGNOSIS — D696 Thrombocytopenia, unspecified: Secondary | ICD-10-CM

## 2011-12-07 ENCOUNTER — Other Ambulatory Visit: Payer: Medicare Other | Admitting: Lab

## 2011-12-07 ENCOUNTER — Ambulatory Visit (HOSPITAL_BASED_OUTPATIENT_CLINIC_OR_DEPARTMENT_OTHER): Payer: Medicare Other | Admitting: Oncology

## 2011-12-07 ENCOUNTER — Encounter: Payer: Self-pay | Admitting: *Deleted

## 2011-12-07 ENCOUNTER — Telehealth: Payer: Self-pay | Admitting: Oncology

## 2011-12-07 VITALS — BP 140/74 | HR 78 | Temp 96.7°F | Ht 70.0 in | Wt 135.1 lb

## 2011-12-07 DIAGNOSIS — R5381 Other malaise: Secondary | ICD-10-CM

## 2011-12-07 DIAGNOSIS — C8589 Other specified types of non-Hodgkin lymphoma, extranodal and solid organ sites: Secondary | ICD-10-CM

## 2011-12-07 DIAGNOSIS — R630 Anorexia: Secondary | ICD-10-CM

## 2011-12-07 DIAGNOSIS — R05 Cough: Secondary | ICD-10-CM

## 2011-12-07 DIAGNOSIS — R059 Cough, unspecified: Secondary | ICD-10-CM

## 2011-12-07 DIAGNOSIS — D696 Thrombocytopenia, unspecified: Secondary | ICD-10-CM

## 2011-12-07 LAB — CBC WITH DIFFERENTIAL/PLATELET
BASO%: 1.8 % (ref 0.0–2.0)
Basophils Absolute: 0.1 10*3/uL (ref 0.0–0.1)
EOS%: 30.7 % — ABNORMAL HIGH (ref 0.0–7.0)
HCT: 30 % — ABNORMAL LOW (ref 38.4–49.9)
HGB: 10.4 g/dL — ABNORMAL LOW (ref 13.0–17.1)
MCH: 32 pg (ref 27.2–33.4)
MCHC: 34.6 g/dL (ref 32.0–36.0)
MONO#: 0.6 10*3/uL (ref 0.1–0.9)
RDW: 18.9 % — ABNORMAL HIGH (ref 11.0–14.6)
WBC: 4.8 10*3/uL (ref 4.0–10.3)
lymph#: 1.1 10*3/uL (ref 0.9–3.3)

## 2011-12-07 LAB — HOLD TUBE, BLOOD BANK

## 2011-12-07 MED ORDER — HYDROCOD POLST-CHLORPHEN POLST 10-8 MG/5ML PO LQCR: 5.0000 mL | Freq: Two times a day (BID) | ORAL | Status: DC | PRN

## 2011-12-07 NOTE — Telephone Encounter (Signed)
Pt scheduled for PET Scan on 12/15/11 waiting for an email from MD for visit

## 2011-12-07 NOTE — Progress Notes (Signed)
   [facility]    OFFICE PROGRESS NOTE   INTERVAL HISTORY:   He returns as scheduled. He complains of malaise and a nonproductive cough. No fever. The cough has been present intermittently over the past month.  He has anorexia and mild nausea. He reports polyuria and in frequent bowel movements. He stays in bed or on the Sofa greater than 50% of the day.  Objective:  Vital signs in last 24 hours:  Blood pressure 140/74, pulse 78, temperature 96.7 F (35.9 C), temperature source Oral, height 5\' 10"  (1.778 m), weight 135 lb 1.6 oz (61.281 kg).    HEENT: No thrush or ulcers Lymphatics: No cervical, supraclavicular, axillary, or inguinal nodes Resp: Spine and inspiratory rails/rhonchi at the left posterior base, no respiratory distress Cardio: Regular rate and rhythm GI: No hepatosplenomegaly, no apparent ascites, nontender, tubular fullness at the left pelvic brim-? Muscle Vascular: No leg edema   Lab Results:  Lab Results  Component Value Date   WBC 4.8 12/07/2011   HGB 10.4* 12/07/2011   HCT 30.0* 12/07/2011   MCV 92.5 12/07/2011   PLT 185 12/07/2011   ANC 1.6    Medications: I have reviewed the patient's current medications.  Assessment/Plan: 1. Pancytopenia-improved today. 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 2. Recent "pneumonia " -a chest x-ray showed no acute change on may first 2013. 3. Renal insufficiency  4. History of atrial fibrillation  5. Enrolled [facility] strength study with [id number]  6. Malaise 7. Anorexia 8. Nonproductive cough  Disposition:  I discussed the bone marrow pathology with Dr. Luisa Hart. The case was referred to [facility]. He indicates the bone marrow is involved by a large B cell lymphoma. There is a background T-cell infiltrate. The final report is pending.  I discussed the diagnosis and treatment options with the patient and his daughter. We discussed standard  R.-CHOP chemotherapy and modifications of this regimen. We also discussed the [facility] bendamustine/rituximab study.  He does not appear to be a good candidate for the R.-CHOP due to his cardiac history, age, and poor performance status (ECoG 2-3). He met with a research nurse to discuss the bendamustine study. He will be enrolled on this study if he meets eligibility criteria and consents to treatment. If not we will plan treatment with Cytoxan/etoposide/vincristine/prednisone and rituximab.  Mr. Douthit will be scheduled for a staging PET scan within the next few days. He will return for an office visit on 12/12/2011.  I suspect the malaise and anorexia are related to non-Hodgkin's lymphoma. The cough may also be related to lymphoma. He will try Tussionex. We will look for evidence of lymphoma involvement of the chest or an inflammatory process on the staging PET scan.    Thornton Papas, MD  12/07/2011  11:56 AM

## 2011-12-08 ENCOUNTER — Other Ambulatory Visit: Payer: Self-pay

## 2011-12-08 ENCOUNTER — Inpatient Hospital Stay (HOSPITAL_COMMUNITY)
Admission: EM | Admit: 2011-12-08 | Discharge: 2011-12-14 | DRG: 683 | Disposition: A | Discharge status: Home / Self Care | Payer: Medicare Other | Attending: Oncology | Admitting: Oncology

## 2011-12-08 ENCOUNTER — Emergency Department (HOSPITAL_COMMUNITY): Payer: Medicare Other

## 2011-12-08 ENCOUNTER — Encounter (HOSPITAL_COMMUNITY): Payer: Self-pay | Admitting: Emergency Medicine

## 2011-12-08 DIAGNOSIS — C8589 Other specified types of non-Hodgkin lymphoma, extranodal and solid organ sites: Secondary | ICD-10-CM | POA: Diagnosis present

## 2011-12-08 DIAGNOSIS — E876 Hypokalemia: Secondary | ICD-10-CM | POA: Diagnosis present

## 2011-12-08 DIAGNOSIS — J449 Chronic obstructive pulmonary disease, unspecified: Secondary | ICD-10-CM | POA: Diagnosis present

## 2011-12-08 DIAGNOSIS — C8510 Unspecified B-cell lymphoma, unspecified site: Secondary | ICD-10-CM

## 2011-12-08 DIAGNOSIS — I4891 Unspecified atrial fibrillation: Secondary | ICD-10-CM

## 2011-12-08 DIAGNOSIS — J4489 Other specified chronic obstructive pulmonary disease: Secondary | ICD-10-CM | POA: Diagnosis present

## 2011-12-08 DIAGNOSIS — N19 Unspecified kidney failure: Secondary | ICD-10-CM

## 2011-12-08 DIAGNOSIS — N179 Acute kidney failure, unspecified: Principal | ICD-10-CM

## 2011-12-08 DIAGNOSIS — D649 Anemia, unspecified: Secondary | ICD-10-CM

## 2011-12-08 DIAGNOSIS — R112 Nausea with vomiting, unspecified: Secondary | ICD-10-CM

## 2011-12-08 DIAGNOSIS — C8299 Follicular lymphoma, unspecified, extranodal and solid organ sites: Secondary | ICD-10-CM

## 2011-12-08 DIAGNOSIS — D630 Anemia in neoplastic disease: Secondary | ICD-10-CM | POA: Diagnosis present

## 2011-12-08 DIAGNOSIS — K5900 Constipation, unspecified: Secondary | ICD-10-CM | POA: Diagnosis present

## 2011-12-08 DIAGNOSIS — R531 Weakness: Secondary | ICD-10-CM

## 2011-12-08 HISTORY — DX: Unspecified B-cell lymphoma, unspecified site: C85.10

## 2011-12-08 LAB — DIFFERENTIAL
Lymphocytes Relative: 30 % (ref 12–46)
Lymphs Abs: 1.5 10*3/uL (ref 0.7–4.0)
Monocytes Relative: 11 % (ref 3–12)
Neutro Abs: 2 10*3/uL (ref 1.7–7.7)
Neutrophils Relative %: 39 % — ABNORMAL LOW (ref 43–77)

## 2011-12-08 LAB — URINALYSIS, ROUTINE W REFLEX MICROSCOPIC
Ketones, ur: NEGATIVE mg/dL
Nitrite: NEGATIVE
Protein, ur: 30 mg/dL — AB

## 2011-12-08 LAB — COMPREHENSIVE METABOLIC PANEL
Alkaline Phosphatase: 114 U/L (ref 39–117)
BUN: 45 mg/dL — ABNORMAL HIGH (ref 6–23)
CO2: 31 mEq/L (ref 19–32)
Chloride: 95 mEq/L — ABNORMAL LOW (ref 96–112)
GFR calc Af Amer: 15 mL/min — ABNORMAL LOW (ref >90)
Glucose, Bld: 104 mg/dL — ABNORMAL HIGH (ref 70–99)
Potassium: 4 mEq/L (ref 3.5–5.1)
Total Bilirubin: 1.3 mg/dL — ABNORMAL HIGH (ref 0.3–1.2)

## 2011-12-08 LAB — DIGOXIN LEVEL: Digoxin Level: 1.4 ng/mL (ref 0.8–2.0)

## 2011-12-08 LAB — CBC
Hemoglobin: 9.6 g/dL — ABNORMAL LOW (ref 13.0–17.0)
MCH: 31.2 pg (ref 26.0–34.0)
RBC: 3.08 MIL/uL — ABNORMAL LOW (ref 4.22–5.81)

## 2011-12-08 LAB — URINE MICROSCOPIC-ADD ON

## 2011-12-08 LAB — CARDIAC PANEL(CRET KIN+CKTOT+MB+TROPI): Relative Index: INVALID (ref 0.0–2.5)

## 2011-12-08 MED ORDER — CLOPIDOGREL BISULFATE 75 MG PO TABS
75.0000 mg | ORAL_TABLET | Freq: Every day | ORAL | Status: DC
  Administered 2011-12-09 – 2011-12-14 (×6): 75 mg via ORAL
  Filled 2011-12-08 (×6): qty 1

## 2011-12-08 MED ORDER — ASPIRIN EC 81 MG PO TBEC
81.0000 mg | DELAYED_RELEASE_TABLET | Freq: Every day | ORAL | Status: DC
  Administered 2011-12-09 – 2011-12-14 (×6): 81 mg via ORAL
  Filled 2011-12-08 (×6): qty 1

## 2011-12-08 MED ORDER — DOCUSATE SODIUM 100 MG PO CAPS
100.0000 mg | ORAL_CAPSULE | Freq: Two times a day (BID) | ORAL | Status: DC
  Administered 2011-12-09 – 2011-12-11 (×6): 100 mg via ORAL
  Filled 2011-12-08 (×9): qty 1

## 2011-12-08 MED ORDER — POLYETHYLENE GLYCOL 3350 17 G PO PACK
17.0000 g | PACK | Freq: Every day | ORAL | Status: DC | PRN
  Filled 2011-12-08: qty 1

## 2011-12-08 MED ORDER — OMEGA-3-ACID ETHYL ESTERS 1 G PO CAPS
1.0000 g | ORAL_CAPSULE | Freq: Every day | ORAL | Status: DC
  Administered 2011-12-09 – 2011-12-11 (×3): 1 g via ORAL
  Filled 2011-12-08 (×4): qty 1

## 2011-12-08 MED ORDER — SODIUM CHLORIDE 0.9 % IJ SOLN
3.0000 mL | Freq: Two times a day (BID) | INTRAMUSCULAR | Status: DC
  Administered 2011-12-14: 3 mL via INTRAVENOUS

## 2011-12-08 MED ORDER — ACETAMINOPHEN 325 MG PO TABS
650.0000 mg | ORAL_TABLET | Freq: Four times a day (QID) | ORAL | Status: DC | PRN
  Administered 2011-12-13: 650 mg via ORAL
  Filled 2011-12-08: qty 2

## 2011-12-08 MED ORDER — ONDANSETRON HCL 4 MG/2ML IJ SOLN
4.0000 mg | Freq: Four times a day (QID) | INTRAMUSCULAR | Status: DC | PRN
  Administered 2011-12-09 (×2): 4 mg via INTRAVENOUS
  Filled 2011-12-08 (×2): qty 2

## 2011-12-08 MED ORDER — SODIUM CHLORIDE 0.9 % IV BOLUS (SEPSIS)
500.0000 mL | Freq: Once | INTRAVENOUS | Status: AC
  Administered 2011-12-08: 500 mL via INTRAVENOUS

## 2011-12-08 MED ORDER — BISACODYL 5 MG PO TBEC
5.0000 mg | DELAYED_RELEASE_TABLET | Freq: Every day | ORAL | Status: DC | PRN
  Filled 2011-12-08: qty 1

## 2011-12-08 MED ORDER — AMIODARONE HCL 200 MG PO TABS
200.0000 mg | ORAL_TABLET | Freq: Every day | ORAL | Status: DC
  Administered 2011-12-10 – 2011-12-14 (×5): 200 mg via ORAL
  Filled 2011-12-08 (×6): qty 1

## 2011-12-08 MED ORDER — SODIUM CHLORIDE 0.9 % IV SOLN
INTRAVENOUS | Status: DC
  Administered 2011-12-09 – 2011-12-10 (×6): via INTRAVENOUS
  Administered 2011-12-10: 150 mL/h via INTRAVENOUS
  Administered 2011-12-11 (×2): via INTRAVENOUS

## 2011-12-08 MED ORDER — ONDANSETRON HCL 4 MG PO TABS: 4.0000 mg | ORAL_TABLET | Freq: Four times a day (QID) | ORAL | Status: DC | PRN

## 2011-12-08 MED ORDER — CALCITONIN (SALMON) 200 UNIT/ML IJ SOLN
240.0000 [IU] | Freq: Two times a day (BID) | INTRAMUSCULAR | Status: DC
  Administered 2011-12-09 (×2): 240 [IU] via INTRAMUSCULAR
  Filled 2011-12-08 (×4): qty 1.2

## 2011-12-08 MED ORDER — DIGOXIN 125 MCG PO TABS
125.0000 ug | ORAL_TABLET | Freq: Every day | ORAL | Status: DC
  Administered 2011-12-10 – 2011-12-14 (×5): 125 ug via ORAL
  Filled 2011-12-08 (×6): qty 1

## 2011-12-08 MED ORDER — SENNA 8.6 MG PO TABS
1.0000 | ORAL_TABLET | Freq: Two times a day (BID) | ORAL | Status: DC
Start: 2011-12-08 — End: 2011-12-14
  Administered 2011-12-09 – 2011-12-12 (×6): 8.6 mg via ORAL
  Filled 2011-12-08 (×9): qty 1

## 2011-12-08 MED ORDER — ACETAMINOPHEN 650 MG RE SUPP: 650.0000 mg | Freq: Four times a day (QID) | RECTAL | Status: DC | PRN

## 2011-12-08 MED ORDER — SODIUM CHLORIDE 0.9 % IV SOLN
60.0000 mg | Freq: Once | INTRAVENOUS | Status: AC
  Administered 2011-12-09: 60 mg via INTRAVENOUS
  Filled 2011-12-08: qty 500

## 2011-12-08 MED ORDER — HEPARIN SODIUM (PORCINE) 5000 UNIT/ML IJ SOLN
5000.0000 [IU] | Freq: Three times a day (TID) | INTRAMUSCULAR | Status: DC
  Administered 2011-12-09 – 2011-12-12 (×11): 5000 [IU] via SUBCUTANEOUS
  Filled 2011-12-08 (×14): qty 1

## 2011-12-08 NOTE — ED Provider Notes (Signed)
History     CSN: [id number]  Arrival date & time 12/08/11  1807   First MD Initiated Contact with Patient 12/08/11 1912      Chief Complaint  Patient presents with  . Abnormal Lab  . Dehydration    (Consider location/radiation/quality/duration/timing/severity/associated sxs/prior treatment) HPI Pt has had 2 weeks of generalized weakness, nausea and tremulousness. +cough productive of brown sputum. Pt recently diagnosed with non-hodgkins lymphoma but has yet to start treatment. Pt seen by PMD today and referred to ED for hypocalcemia. Pt denies fever, chills, abd pain, v/d, urinary symptoms.  Past Medical History  Diagnosis Date  . Hypertension   . A-fib   . Anemia   . Pneumonia April 2013  . COPD (chronic obstructive pulmonary disease) dx April 2013  . Mitral valve prolapse     Past Surgical History  Procedure Date  . Cholecystectomy 1980's  . Chest tube insertion 1990's    fell  thru deck, punctured lung, chest tube  . Tonsillectomy   . Circumcision age 46    Family History  Problem Relation Age of Onset  . Cancer Father   . Heart attack Father   . Atrial fibrillation Mother     History  Substance Use Topics  . Smoking status: Never Smoker   . Smokeless tobacco: Never Used  . Alcohol Use: No      Review of Systems  Constitutional: Positive for fatigue. Negative for fever and chills.  Respiratory: Positive for cough. Negative for shortness of breath.   Cardiovascular: Negative for chest pain, palpitations and leg swelling.  Gastrointestinal: Positive for nausea. Negative for vomiting, abdominal pain and diarrhea.  Genitourinary: Negative for dysuria and flank pain.  Musculoskeletal: Negative for back pain.  Skin: Negative for rash.  Neurological: Positive for weakness. Negative for dizziness, light-headedness, numbness and headaches.    Allergies  Review of patient's allergies indicates no known allergies.  Home Medications   Current Outpatient Rx    Name Route Sig Dispense Refill  . AMIODARONE HCL 200 MG PO TABS Oral Take 200 mg by mouth daily.    . ASPIRIN 81 MG PO TABS Oral Take 81 mg by mouth daily.    [name] BENICAR 20 MG PO TABS Oral Take 20 mg by mouth Daily. When out of atacand    . CANDESARTAN CILEXETIL 8 MG PO TABS Oral Take 8 mg by mouth daily.    [name] CLOPIDOGREL BISULFATE 75 MG PO TABS Oral Take 75 mg by mouth daily.    [name] DIGOXIN 0.125 MG PO TABS Oral Take 125 mcg by mouth daily.    . OMEGA-3 FATTY ACIDS 1000 MG PO CAPS Oral Take 1 g by mouth daily.    [name] HYDROCOD POLST-CPM POLST ER 10-8 MG/5ML PO LQCR Oral Take 5 mLs by mouth every 12 (twelve) hours as needed (cough). 300 mL 1  . IBUPROFEN 200 MG PO TABS Oral Take 200 mg by mouth every 6 (six) hours as needed.       BP 157/51  Pulse 65  Temp(Src) 98.2 F (36.8 C) (Oral)  Resp 19  SpO2 97%  Physical Exam  Nursing note and vitals reviewed. Constitutional: He is oriented to person, place, and time. He appears well-developed and well-nourished. No distress.  HENT:  Head: Normocephalic and atraumatic.  Mouth/Throat: Oropharynx is clear and moist.  Eyes: EOM are normal. Pupils are equal, round, and reactive to light.  Neck: Normal range of motion. Neck supple.  Cardiovascular: Normal rate and regular rhythm.  Pulmonary/Chest: Effort normal and breath sounds normal. No respiratory distress. He has no wheezes. He has no rales.  Abdominal: Soft. Bowel sounds are normal. There is no tenderness. There is no rebound and no guarding.  Musculoskeletal: Normal range of motion. He exhibits no edema and no tenderness.  Neurological: He is alert and oriented to person, place, and time.       4/5 strength in all ext  Skin: Skin is warm and dry. No rash noted. No erythema.  Psychiatric: He has a normal mood and affect. His behavior is normal.    ED Course  Procedures (including critical care time)  Labs Reviewed  CBC - Abnormal; Notable for the following:    RBC 3.08 (*)     Hemoglobin 9.6 (*)    HCT 28.1 (*)    RDW 17.1 (*)    All other components within normal limits  DIFFERENTIAL - Abnormal; Notable for the following:    Neutrophils Relative 39 (*)    Eosinophils Relative 19 (*)    Eosinophils Absolute 1.0 (*)    All other components within normal limits  COMPREHENSIVE METABOLIC PANEL - Abnormal; Notable for the following:    Chloride 95 (*)    Glucose, Bld 104 (*)    BUN 45 (*)    Creatinine, Ser 3.95 (*)    Calcium 14.9 (*)    Total Bilirubin 1.3 (*)    GFR calc non Af Amer 13 (*)    GFR calc Af Amer 15 (*)    All other components within normal limits  URINALYSIS, ROUTINE W REFLEX MICROSCOPIC - Abnormal; Notable for the following:    Hgb urine dipstick MODERATE (*)    Protein, ur 30 (*)    Leukocytes, UA TRACE (*)    All other components within normal limits  URINE MICROSCOPIC-ADD ON - Abnormal; Notable for the following:    Crystals CA OXALATE CRYSTALS (*)    All other components within normal limits  CARDIAC PANEL(CRET KIN+CKTOT+MB+TROPI)  MAGNESIUM  DIGOXIN LEVEL   Dg Chest 2 View  12/08/2011  *RADIOLOGY REPORT*  Clinical Data: Cough.  Hypertension.  Lymphoma.  CHEST - 2 VIEW  Comparison: 11/23/2011  Findings: Peripheral scarring at the right lung base adjacent to remote rib fractures noted.  Attenuated peripheral pulmonary vasculature is compatible with emphysema/COPD.  Biapical pleuroparenchymal scarring noted.  No adenopathy in the chest is observed.  Cardiac and mediastinal contours appear unremarkable.  IMPRESSION:  1. Possible COPD. 2.  Scarring in the lung apices and in the lateral basal segment right lower lobe. 2.  Old right rib deformities from fractures.  Original Report Authenticated By: Dellia Cloud, M.D.     1. Hypercalcemia   2. Renal failure   3. Generalized weakness      Date: 12/08/2011  Rate: 74  Rhythm: normal sinus rhythm  QRS Axis: normal  Intervals: QRS prolonged  ST/T Wave abnormalities: nonspecific ST  changes  Conduction Disutrbances:right bundle branch block  Narrative Interpretation:   Old EKG Reviewed: unchanged    MDM  Discussed with [facility]. Will admit pt.         Loren Racer, MD 12/08/11 2145

## 2011-12-08 NOTE — ED Notes (Signed)
Pt presenting to ed with c/o low calcium sent by pcp. Pt sent to ed for evaluation of dehydration. Pt states nausea no vomiting. pt states he found out yesterday that he has non-hodgkins lymphoma. Pt denies abdominal pain at this time

## 2011-12-08 NOTE — H&P (Addendum)
PCP:  Darnelle Bos, MD, MD   Confirmed with pt  Dr. Truett Perna, oncology Dr. Alanda Amass cardiology  Chief Complaint:  High calcium on labs, referred to ED. Weakness.  HPI: 78yoM with h/o COPD, AFib, mitral valve prolapse, and recently diagnosed  this month with large B-cell lymphoma who now presents with acute on ? chronic  renal failure and calcium ~15.   Pt was last admitted to [facility] on 11/23/2011 with  tremulousness, weakness. Found to be anemic, but heme negative stool,  also thrombocytopenic. Cr was 1.93 on admit, no baseline. Given 2u  PRBC's. Treated for a UTI, started flomax for urinary retention. He also  had a BM Bx on 5/3 and saw Dr. Truett Perna in follow up on 5/8 and 5/15 and  the final Dx was large B-cell lymphoma. They discussed various  treatments including research studies, planned PET scan, plan f/u with  Dr. Truett Perna on 12/12/2011.   He had a scheduled post discharge PCP f/u with Dr. Earl Gala on 5/16 who  got some labs for his persistent weakness and found to have abnormal Ca  and promptly directed to the ED.   Labs showed new ARF with renal 45/3.95 up from 27/176 at the beginning  of the month. Calcium also up at 14.9. LFT's essentially normal, cardiac  enzymes negative. CBC stable. Digoxin normal. UA with Hgb but only 3-6  RBC's, with normal CK, no signs infection, Ca ox crystals. CXR showed  possible COPD, scarring in apices and RLL, and old rib fractures.   ROS with persistent weakness that improved somewhat after blood  transfusion, but it insidiously returned. Also with dry cough, nausea no  vomiting, constipation, noctiuria and polyuria, poor appetite. Denies  fevers, chills, rigors, sweats, chest pain, dyspnea, dysuria, abd pain,  otherwise unremarkable/stable.   Past Medical History  Diagnosis Date  . Hypertension   . A-fib     On amiodarone, digoxin. Not on coumadin  . Anemia   . Pneumonia April 2013  . COPD (chronic  obstructive pulmonary disease) dx April 2013  . Mitral valve prolapse   . Large B-cell lymphoma 11/2011    Past Surgical History  Procedure Date  . Cholecystectomy 1980's  . Chest tube insertion 1990's    fell  thru deck, punctured lung, chest tube  . Tonsillectomy   . Circumcision age 38    Medications:  HOME MEDS: Pt able to name his own meds.  Prior to Admission medications   Medication Sig Start Date End Date Taking? Authorizing Provider  amiodarone (PACERONE) 200 MG tablet Take 200 mg by mouth daily.   Yes Historical Provider, MD  aspirin 81 MG tablet Take 81 mg by mouth daily.   Yes Historical Provider, MD  BENICAR 20 MG tablet Take 20 mg by mouth Daily. When out of atacand 12/07/11  Yes Historical Provider, MD  candesartan (ATACAND) 8 MG tablet Take 8 mg by mouth daily.   Yes Historical Provider, MD  clopidogrel (PLAVIX) 75 MG tablet Take 75 mg by mouth daily.   Yes Historical Provider, MD  digoxin (LANOXIN) 0.125 MG tablet Take 125 mcg by mouth daily.   Yes Historical Provider, MD  fish oil-omega-3 fatty acids 1000 MG capsule Take 1 g by mouth daily.   Yes Historical Provider, MD  chlorpheniramine-HYDROcodone (TUSSIONEX) 10-8 MG/5ML LQCR Take 5 mLs by mouth every 12 (twelve) hours as needed (cough). 12/07/11   Ladene Artist, MD  ibuprofen (ADVIL,MOTRIN) 200 MG tablet Take 200 mg by mouth every  6 (six) hours as needed.     Historical Provider, MD    Allergies:  No Known Allergies  Social History:   reports that he has never smoked. He has never used smokeless tobacco. He reports that he does not drink alcohol or use illicit drugs. Widowed x 3 years.  Lives alone.  Independent of ADLs and ambulation.  Emergency Contact: Pepe Mineau (Daugher): [phone]. Has two daughters. Still living at home, doesn't use cane/walker.   Family History: Family History  Problem Relation Age of Onset  . Cancer Father   . Heart attack Father   . Atrial fibrillation Mother     Physical  Exam: Filed Vitals:   12/08/11 2000 12/08/11 2030 12/08/11 2100 12/08/11 2130  BP: 158/52 157/51 154/55 147/52  Pulse: 61 65 65 63  Temp:      TempSrc:      Resp: 17 19 18 17   Height:   5' 10.08" (1.78 m)   Weight:   61.3 kg (135 lb 2.3 oz)   SpO2: 100% 97% 98% 97%   Blood pressure 147/52, pulse 63, temperature 98.2 F (36.8 C), temperature source Oral, resp. rate 17, height 5' 10.08" (1.78 m), weight 61.3 kg (135 lb 2.3 oz), SpO2 97.00%.  Gen: Thin, somewhat frail appearing, very pleasant and nice gentleman in  ED stretcher, able to relate history well, not ill or toxic appearing, a  bit fatigued HEENT: Pupils round ~45mm, reactive, equal, sclera/irises clear, EOMI, no  nystagmus. Mouth moist and normal appearing Lungs: CTAB no w/c/r, good air movement, breathing comfortably without  increased WOB or accessory muscles Heart: Regular, not tachy, S1/2 appreciated, no m/g heard Abd: Soft, scahpoid, not tender, not dsitended, no grimacing, benign Extrem: Thin, decreased muscle bulk but normal tone, no clonus or  rigidity noted, radials palpable, warm, no BLE edema Neuro: Alert, attentive conversant, CN 2-12 intact wihtout slurring or  aphasia, moves extremities spontaneously, able to sit up in bed on his  own, grossly non-focal.   Labs & Imaging Results for orders placed during the hospital encounter of 12/08/11 (from the past 48 hour(s))  CBC     Status: Abnormal   Collection Time   12/08/11  7:53 PM      Component Value Range Comment   WBC 5.0  4.0 - 10.5 (K/uL)    RBC 3.08 (*) 4.22 - 5.81 (MIL/uL)    Hemoglobin 9.6 (*) 13.0 - 17.0 (g/dL)    HCT 91.4 (*) 78.2 - 52.0 (%)    MCV 91.2  78.0 - 100.0 (fL)    MCH 31.2  26.0 - 34.0 (pg)    MCHC 34.2  30.0 - 36.0 (g/dL)    RDW 95.6 (*) 21.3 - 15.5 (%)    Platelets 179  150 - 400 (K/uL)   DIFFERENTIAL     Status: Abnormal   Collection Time   12/08/11  7:53 PM      Component Value Range Comment   Neutrophils Relative 39 (*) 43 - 77  (%)    Neutro Abs 2.0  1.7 - 7.7 (K/uL)    Lymphocytes Relative 30  12 - 46 (%)    Lymphs Abs 1.5  0.7 - 4.0 (K/uL)    Monocytes Relative 11  3 - 12 (%)    Monocytes Absolute 0.5  0.1 - 1.0 (K/uL)    Eosinophils Relative 19 (*) 0 - 5 (%)    Eosinophils Absolute 1.0 (*) 0.0 - 0.7 (K/uL)    Basophils Relative 1  0 - 1 (%)    Basophils Absolute 0.0  0.0 - 0.1 (K/uL)   COMPREHENSIVE METABOLIC PANEL     Status: Abnormal   Collection Time   12/08/11  7:53 PM      Component Value Range Comment   Sodium 135  135 - 145 (mEq/L)    Potassium 4.0  3.5 - 5.1 (mEq/L)    Chloride 95 (*) 96 - 112 (mEq/L)    CO2 31  19 - 32 (mEq/L)    Glucose, Bld 104 (*) 70 - 99 (mg/dL)    BUN 45 (*) 6 - 23 (mg/dL)    Creatinine, Ser 1.61 (*) 0.50 - 1.35 (mg/dL)    Calcium 09.6 (*) 8.4 - 10.5 (mg/dL)    Total Protein 6.3  6.0 - 8.3 (g/dL)    Albumin 3.9  3.5 - 5.2 (g/dL)    AST 15  0 - 37 (U/L)    ALT 13  0 - 53 (U/L)    Alkaline Phosphatase 114  39 - 117 (U/L)    Total Bilirubin 1.3 (*) 0.3 - 1.2 (mg/dL)    GFR calc non Af Amer 13 (*) >90 (mL/min)    GFR calc Af Amer 15 (*) >90 (mL/min)   CARDIAC PANEL(CRET KIN+CKTOT+MB+TROPI)     Status: Normal   Collection Time   12/08/11  7:53 PM      Component Value Range Comment   Total CK 63  7 - 232 (U/L)    CK, MB 2.9  0.3 - 4.0 (ng/mL)    Troponin I <0.30  <0.30 (ng/mL)    Relative Index RELATIVE INDEX IS INVALID  0.0 - 2.5    MAGNESIUM     Status: Normal   Collection Time   12/08/11  7:53 PM      Component Value Range Comment   Magnesium 2.1  1.5 - 2.5 (mg/dL)   DIGOXIN LEVEL     Status: Normal   Collection Time   12/08/11  7:53 PM      Component Value Range Comment   Digoxin Level 1.4  0.8 - 2.0 (ng/mL)   URINALYSIS, ROUTINE W REFLEX MICROSCOPIC     Status: Abnormal   Collection Time   12/08/11  8:20 PM      Component Value Range Comment   Color, Urine YELLOW  YELLOW     APPearance CLEAR  CLEAR     Specific Gravity, Urine 1.016  1.005 - 1.030     pH 6.5   5.0 - 8.0     Glucose, UA NEGATIVE  NEGATIVE (mg/dL)    Hgb urine dipstick MODERATE (*) NEGATIVE     Bilirubin Urine NEGATIVE  NEGATIVE     Ketones, ur NEGATIVE  NEGATIVE (mg/dL)    Protein, ur 30 (*) NEGATIVE (mg/dL)    Urobilinogen, UA 0.2  0.0 - 1.0 (mg/dL)    Nitrite NEGATIVE  NEGATIVE     Leukocytes, UA TRACE (*) NEGATIVE    URINE MICROSCOPIC-ADD ON     Status: Abnormal   Collection Time   12/08/11  8:20 PM      Component Value Range Comment   Squamous Epithelial / LPF RARE  RARE     WBC, UA 0-2  <3 (WBC/hpf)    RBC / HPF 3-6  <3 (RBC/hpf)    Crystals CA OXALATE CRYSTALS (*) NEGATIVE     Dg Chest 2 View  12/08/2011  *RADIOLOGY REPORT*  Clinical Data: Cough.  Hypertension.  Lymphoma.  CHEST - 2  VIEW  Comparison: 11/23/2011  Findings: Peripheral scarring at the right lung base adjacent to remote rib fractures noted.  Attenuated peripheral pulmonary vasculature is compatible with emphysema/COPD.  Biapical pleuroparenchymal scarring noted.  No adenopathy in the chest is observed.  Cardiac and mediastinal contours appear unremarkable.  IMPRESSION:  1. Possible COPD. 2.  Scarring in the lung apices and in the lateral basal segment right lower lobe. 2.  Old right rib deformities from fractures.  Original Report Authenticated By: Dellia Cloud, M.D.     ECG: NSR with far left axis, LAFB. Wide open RBBB and LAFB. Normal P and  PR interval. No frank ST deviations. Essentially unchanged compared to  prior. QTc 435.    Impression Present on Admission:  .COPD (chronic obstructive pulmonary disease) .Large B-cell lymphoma .A-fib .Acute renal failure .Normocytic anemia .Hypercalcemia of malignancy  82yoM with h/o COPD, AFib, mitral valve prolapse, and recently diagnosed  this month with large B-cell lymphoma who now presents with acute on ? chronic  renal failure and calcium ~15.   1. Hypercalcemia: Most likely hyperCa of malignancy given recent  lymphoma diagnosis.  - IVF's,  calcitonin. Have spoken to pharmacy about the perpetual  question of bisphosphanates in renal failure -- plan to give lower dose,  slower rate of pamidronate 60 mg over 6 hours. Lasix at this point is  not generally recommended.  - Will get Mg and Phos with next labs, and PTH and PTHRP - Definitive management per oncology, upcoming chemo plan   2. ARF: Pt clearly in ARF with Cr 3.95. Baseline not clear given first  documented values were this month, 1.7-1.9. Etiology surely due to  hypercalcemia  - Holding ARB, aggressive IVF's as above.   3. Anemia: Hgb is stable, rest of blood counts normal.   4. H/o COPD: This seems to have been incidentally discovered this month  on CXR. No current dyspnea, although he does have chronic cough. Current  CXR negative. ? if cough related to the lymphoma -- PET scheduled soon.   5. H/o AFib: Currently in sinus rhythm. CHADS = 2 for age and HTN, not  on coumadin.  - Continue home ASA 81, plavix, digoxin, amiodarone  6. Constipation: Aggressive BM regimen PRN. Could be poor PO intake  though  SubQ heparin DVT prophy Telemetry, Admit to Dr. Earl Gala OK with shock/compression x1 only, then stop, and no intubation  Other plans as per orders.  Robbye Dede 12/08/2011, 11:05 PM

## 2011-12-09 ENCOUNTER — Encounter: Payer: Self-pay | Admitting: *Deleted

## 2011-12-09 ENCOUNTER — Encounter (HOSPITAL_COMMUNITY): Payer: Self-pay

## 2011-12-09 DIAGNOSIS — R5383 Other fatigue: Secondary | ICD-10-CM

## 2011-12-09 DIAGNOSIS — C8589 Other specified types of non-Hodgkin lymphoma, extranodal and solid organ sites: Secondary | ICD-10-CM

## 2011-12-09 DIAGNOSIS — R5381 Other malaise: Secondary | ICD-10-CM

## 2011-12-09 DIAGNOSIS — K5900 Constipation, unspecified: Secondary | ICD-10-CM | POA: Diagnosis present

## 2011-12-09 DIAGNOSIS — N19 Unspecified kidney failure: Secondary | ICD-10-CM

## 2011-12-09 LAB — MAGNESIUM: Magnesium: 1.9 mg/dL (ref 1.5–2.5)

## 2011-12-09 LAB — BASIC METABOLIC PANEL
BUN: 44 mg/dL — ABNORMAL HIGH (ref 6–23)
Calcium: 13.6 mg/dL (ref 8.4–10.5)
Creatinine, Ser: 3.92 mg/dL — ABNORMAL HIGH (ref 0.50–1.35)
GFR calc Af Amer: 15 mL/min — ABNORMAL LOW (ref >90)
GFR calc non Af Amer: 13 mL/min — ABNORMAL LOW (ref >90)
Glucose, Bld: 92 mg/dL (ref 70–99)
Potassium: 3.5 mEq/L (ref 3.5–5.1)

## 2011-12-09 LAB — CBC
Hemoglobin: 8.3 g/dL — ABNORMAL LOW (ref 13.0–17.0)
MCH: 31.2 pg (ref 26.0–34.0)
MCHC: 34.2 g/dL (ref 30.0–36.0)
Platelets: 141 10*3/uL — ABNORMAL LOW (ref 150–400)
RDW: 17.2 % — ABNORMAL HIGH (ref 11.5–15.5)

## 2011-12-09 LAB — PHOSPHORUS: Phosphorus: 4.7 mg/dL — ABNORMAL HIGH (ref 2.3–4.6)

## 2011-12-09 MED ORDER — BOOST / RESOURCE BREEZE PO LIQD
1.0000 | Freq: Two times a day (BID) | ORAL | Status: DC
  Administered 2011-12-10 – 2011-12-13 (×5): 1 via ORAL

## 2011-12-09 MED ORDER — ENSURE COMPLETE PO LIQD
237.0000 mL | Freq: Every day | ORAL | Status: DC
  Administered 2011-12-09 – 2011-12-12 (×3): 237 mL via ORAL

## 2011-12-09 NOTE — Progress Notes (Signed)
Subjective: Says he feels okay. Despite everything going on with IV, labs, getting to room, etc, says he slept better here than at home! Mentating normally. No muscle cramps overnight  No BM for one week. Notes some churning in the abdomen.   Objective:  Vital Signs: Filed Vitals:   12/08/11 2100 12/08/11 2130 12/08/11 2337 12/09/11 0518  BP: 154/55 147/52 170/68 141/66  Pulse: 65 63 62 57  Temp:   98.3 F (36.8 C) 98 F (36.7 C)  TempSrc:   Oral Oral  Resp: 18 17 16 16   Height: 5' 10.08" (1.78 m)  5\' 10"  (1.778 m)   Weight: 61.3 kg (135 lb 2.3 oz)  61.5 kg (135 lb 9.3 oz) 60.4 kg (133 lb 2.5 oz)  SpO2: 98% 97% 96% 95%     EXAM: Awakens easily. Alert and oriented   Intake/Output Summary (Last 24 hours) at 12/09/11 0618 Last data filed at 12/09/11 0600  Gross per 24 hour  Intake   2200 ml  Output    375 ml  Net   1825 ml    Lab Results:  [facility] 12/09/11 0430 12/08/11 1953  NA 137 135  K 3.5 4.0  CL 100 95*  CO2 30 31  GLUCOSE 92 104*  BUN 44* 45*  CREATININE 3.92* 3.95*  CALCIUM 13.6* 14.9*  MG 1.9 2.1  PHOS 4.7* --    Basename 12/08/11 1953  AST 15  ALT 13  ALKPHOS 114  BILITOT 1.3*  PROT 6.3  ALBUMIN 3.9   No results found for this basename: LIPASE:2,AMYLASE:2 in the last 72 hours  Basename 12/09/11 0430 12/08/11 1953  WBC 3.9* 5.0  NEUTROABS -- 2.0  HGB 8.3* 9.6*  HCT 24.3* 28.1*  MCV 91.4 91.2  PLT 141* 179    Basename 12/08/11 1953  CKTOTAL 63  CKMB 2.9  CKMBINDEX --  TROPONINI <0.30   No components found with this basename: POCBNP:3 No results found for this basename: DDIMER:2 in the last 72 hours No results found for this basename: HGBA1C:2 in the last 72 hours No results found for this basename: CHOL:2,HDL:2,LDLCALC:2,TRIG:2,CHOLHDL:2,LDLDIRECT:2 in the last 72 hours No results found for this basename: TSH,T4TOTAL,FREET3,T3FREE,THYROIDAB in the last 72 hours No results found for this basename:  VITAMINB12:2,FOLATE:2,FERRITIN:2,TIBC:2,IRON:2,RETICCTPCT:2 in the last 72 hours  Studies/Results: Dg Chest 2 View  12/08/2011  *RADIOLOGY REPORT*  Clinical Data: Cough.  Hypertension.  Lymphoma.  CHEST - 2 VIEW  Comparison: 11/23/2011  Findings: Peripheral scarring at the right lung base adjacent to remote rib fractures noted.  Attenuated peripheral pulmonary vasculature is compatible with emphysema/COPD.  Biapical pleuroparenchymal scarring noted.  No adenopathy in the chest is observed.  Cardiac and mediastinal contours appear unremarkable.  IMPRESSION:  1. Possible COPD. 2.  Scarring in the lung apices and in the lateral basal segment right lower lobe. 2.  Old right rib deformities from fractures.  Original Report Authenticated By: Dellia Cloud, M.D.   Medications: Scheduled Meds:   . amiodarone  200 mg Oral Daily  . aspirin EC  81 mg Oral Daily  . calcitonin  240 Units Intramuscular q12n4p  . clopidogrel  75 mg Oral Daily  . digoxin  125 mcg Oral Daily  . docusate sodium  100 mg Oral BID  . heparin  5,000 Units Subcutaneous Q8H  . omega-3 acid ethyl esters  1 g Oral Daily  . pamidronate  60 mg Intravenous Once  . senna  1 tablet Oral BID  . sodium chloride  500 mL  Intravenous Once  . sodium chloride  3 mL Intravenous Q12H   Continuous Infusions:   . sodium chloride 150 mL/hr at 12/09/11 0001   PRN Meds:.acetaminophen, acetaminophen, bisacodyl, ondansetron (ZOFRAN) IV, ondansetron, polyethylene glycol  Assessment/Plan: Principal Problem:  *Hypercalcemia of malignancy - I note Dr. Lawson Radar excellent workup and start of treatment. Ca is a bit better this morning. Will monitor closely. See comments under lymphoma below Active Problems:  Normocytic anemia  Acute renal failure - this is likely secondary to the hypercalcemia with resulting volume contraction. Hopefully will improve considerably with fluids and resolution of hypercalcemia  COPD (chronic obstructive pulmonary  disease) - seen on CXR mainly. Not really symptomatic  Large B-cell lymphoma - spoke with Dr. Truett Perna last night who will see him today. Will need to start treatment very soon.   A-fib - stable.   Constipation - patient would like to wait until tomorrow to consider a laxative.    LOS: 1 day   Ameah Chanda CHARLES 12/09/2011, 6:18 AM

## 2011-12-09 NOTE — Progress Notes (Signed)
IP PROGRESS NOTE  Subjective:   He was admitted last night with generalized weakness and hypercalcemia. He reports feeling better this morning. The appetite is improved.  Objective: Vital signs in last 24 hours: Blood pressure 141/66, pulse 57, temperature 98 F (36.7 C), temperature source Oral, resp. rate 16, height 5\' 10"  (1.778 m), weight 133 lb 2.5 oz (60.4 kg), SpO2 95.00%.  Intake/Output from previous day: 05/16 0701 - 05/17 0700 In: 2200 [I.V.:1700; IV Piggyback:500] Out: 375 [Urine:375]  Physical Exam:  HEENT: No thrush Lungs: And inspiratory rails at the left posterior base, no respiratory distress Cardiac: Regular rate and rhythm Abdomen: Nontender, no hepatosplenomegaly Extremities: No leg edema Neurologic: Alert and oriented    Lab Results:  [facility] 12/09/11 0430 12/08/11 1953  WBC 3.9* 5.0  HGB 8.3* 9.6*  HCT 24.3* 28.1*  PLT 141* 179    BMET  Basename 12/09/11 0430 12/08/11 1953  NA 137 135  K 3.5 4.0  CL 100 95*  CO2 30 31  GLUCOSE 92 104*  BUN 44* 45*  CREATININE 3.92* 3.95*  CALCIUM 13.6* 14.9*    Studies/Results: Dg Chest 2 View  12/08/2011  *RADIOLOGY REPORT*  Clinical Data: Cough.  Hypertension.  Lymphoma.  CHEST - 2 VIEW  Comparison: 11/23/2011  Findings: Peripheral scarring at the right lung base adjacent to remote rib fractures noted.  Attenuated peripheral pulmonary vasculature is compatible with emphysema/COPD.  Biapical pleuroparenchymal scarring noted.  No adenopathy in the chest is observed.  Cardiac and mediastinal contours appear unremarkable.  IMPRESSION:  1. Possible COPD. 2.  Scarring in the lung apices and in the lateral basal segment right lower lobe. 2.  Old right rib deformities from fractures.  Original Report Authenticated By: Dellia Cloud, M.D.    Medications: I have reviewed the patient's current medications.  Assessment/Plan:  1. Non-Hodgkin's lymphoma, large B-cell lymphoma involving the bone marrow, CD20  positive. A staging PET scan was scheduled as an outpatient. We will initiate a staging evaluation in the hospital. The plan is to initiate systemic therapy within the next several days.  2. Hypercalcemia of malignancy-status post pamidronate therapy and maintained on intravenous hydration. Improved this morning.  3. Renal failure-likely related to dehydration in the setting of hypercalcemia  4. Anemia secondary to non-Hodgkin's lymphoma  5. Constipation-likely related to hypercalcemia  6. History of atrial fibrillation  7. COPD   He was admitted with symptomatic hypercalcemia. The calcium is lower in his clinical status appears improved today. We will complete a staging evaluation and initiate systemic therapy within the next several days. Hopefully his renal function will improve prior to beginning systemic chemotherapy.  I discussed treatment options with Thomas Doyle and his daughter earlier this week. We discussed "standard" R.-CHOP, variation as of the CHOP regimen, and a clinical trial with bendamustine. He will not be a candidate for the clinical trial given the urgent need for therapy. The plan will be to initiate systemic chemotherapy with CVP-rituximab plus/minus the addition of mitoxantrone or etoposide.  Recommendations: 1. Continue intravenous hydration with a followup of the serum calcium level. The calcitonin can be discontinued if the calcium is improved on 12/10/2011 and he is asymptomatic.  2. Staging PET scan-I. discussed the indication for a staging PET scan with radiology. The PET scan machine is non-functional today. He will be scheduled for a PET scan on 12/12/2011. We will obtain a no IV contrast CT evaluation if treatment is required prior to 12/12/2011.  3. Begin allopurinol prior to chemotherapy  4. Transferred to the third for oncology unit on May 19 or May 20 in anticipation of inpatient chemotherapy.  Oncology will  continue to follow Thomas Doyle daily. I will be  glad to transfer him to the oncology service if Dr. Earl Gala feels this is appropriate.   LOS: 1 day   Adian Jablonowski  12/09/2011, 9:17 AM

## 2011-12-09 NOTE — Progress Notes (Signed)
INITIAL ADULT NUTRITION ASSESSMENT Date: 12/09/2011   Time: 4:34 PM Reason for Assessment: Nutrition risk   ASSESSMENT: Male 78 y.o.  Dx: Hypercalcemia of malignancy  Food/Nutrition Related Hx: Pt with newly diagnosed this month to have large B-cell lymphoma. Pt reports poor intake for the past 2 weeks, with typical meal intake between 0-1 meal/day. Pt drinks at least 1 Ensure a day at home. Pt lives alone but his 2 daughters make sure his refrigerator is full of food. Pt denies any problems chewing or swallowing but does state that food doesn't taste good and that he has dry mouth. Pt reports nausea for the past 2 weeks but states the Zofran is helping with this. Pt reports 19 pound weight loss since January of this year. Pt reports eating 100% of his lunch today - however this was all liquids - soups, ice cream, etc. Noted plans to transfer to oncology unit to begin inpatient chemotherapy. Noted pt with constipation - last BM on 5/10 - is getting scheduled Colace and Senokot.   Hx: Past Medical History  Diagnosis Date  . Hypertension   . A-fib     On amiodarone, digoxin. Not on coumadin  . Anemia   . Pneumonia April 2013  . COPD (chronic obstructive pulmonary disease) dx April 2013  . Mitral valve prolapse   . Large B-cell lymphoma 11/2011   Related Meds:  Scheduled Meds:   . amiodarone  200 mg Oral Daily  . aspirin EC  81 mg Oral Daily  . calcitonin  240 Units Intramuscular q12n4p  . clopidogrel  75 mg Oral Daily  . digoxin  125 mcg Oral Daily  . docusate sodium  100 mg Oral BID  . heparin  5,000 Units Subcutaneous Q8H  . omega-3 acid ethyl esters  1 g Oral Daily  . pamidronate  60 mg Intravenous Once  . senna  1 tablet Oral BID  . sodium chloride  500 mL Intravenous Once  . sodium chloride  3 mL Intravenous Q12H   Continuous Infusions:   . sodium chloride 150 mL/hr at 12/09/11 1105   PRN Meds:.acetaminophen, acetaminophen, bisacodyl, ondansetron (ZOFRAN) IV, ondansetron,  polyethylene glycol  Ht: 5\' 10"  (177.8 cm)  Wt: 133 lb 2.5 oz (60.4 kg)  Ideal Wt: 166 lb % Ideal Wt: 80  Usual Wt: 152 lb % Usual Wt: 87  Body mass index is 19.11 kg/(m^2).   Labs:  CMP     Component Value Date/Time   NA 137 12/09/2011 0430   K 3.5 12/09/2011 0430   CL 100 12/09/2011 0430   CO2 30 12/09/2011 0430   GLUCOSE 92 12/09/2011 0430   BUN 44* 12/09/2011 0430   CREATININE 3.92* 12/09/2011 0430   CALCIUM 13.6* 12/09/2011 0430   PROT 6.3 12/08/2011 1953   ALBUMIN 3.9 12/08/2011 1953   AST 15 12/08/2011 1953   ALT 13 12/08/2011 1953   ALKPHOS 114 12/08/2011 1953   BILITOT 1.3* 12/08/2011 1953   GFRNONAA 13* 12/09/2011 0430   GFRAA 15* 12/09/2011 0430    Intake/Output Summary (Last 24 hours) at 12/09/11 1640 Last data filed at 12/09/11 1511  Gross per 24 hour  Intake   3920 ml  Output   1250 ml  Net   2670 ml   Last BM - 12/02/11  Diet Order: General   IVF:    sodium chloride Last Rate: 150 mL/hr at 12/09/11 1105    Estimated Nutritional Needs:   Kcal:1800-2100 Protein:90-110g Fluid:1.8-2.1L  NUTRITION DIAGNOSIS: -Inadequate oral  intake (NI-2.1).  Status: Ongoing -Pt meets criteria for severe PCM of acute illness AEB 12.5% weight loss in the past 5 months with <50% energy intake in the past 2 weeks per pt report   RELATED TO: nausea, poor appetite  AS EVIDENCE BY: pt statement, weight loss PTA  MONITORING/EVALUATION(Goals): Pt to consume >90% of meals/supplements.   EDUCATION NEEDS: -Education needs addressed - encouraged intake of small frequent meals/snacks that are high in protein/calories to build up weight and strength.   INTERVENTION: Vanilla/strawberry Ensure Complete daily. Resource Breeze BID. Recommend Biotene for dry mouth. Will monitor.   Dietitian #: [phone]  DOCUMENTATION CODES Per approved criteria  -Severe malnutrition in the context of acute illness    Marshall Cork 12/09/2011, 4:34 PM

## 2011-12-09 NOTE — Evaluation (Signed)
Physical Therapy One time Evaluation and D/C from PT Patient Details Name: Thomas Doyle MRN: [id number] DOB: 07/10/1929 Today's Date: 12/09/2011 Time: 7829-5621 PT Time Calculation (min): 13 min  PT Assessment / Plan / Recommendation Clinical Impression  Pt admitted for hypercalcemia.  Pt is currently at baseline and does not require skilled PT at this time.    PT Assessment  Patent does not need any further PT services    Follow Up Recommendations  No PT follow up    Barriers to Discharge        lEquipment Recommendations  None recommended by PT    Recommendations for Other Services     Frequency      Precautions / Restrictions Restrictions Weight Bearing Restrictions: No   Pertinent Vitals/Pain       Mobility  Bed Mobility Bed Mobility: Supine to Sit Supine to Sit: 7: Independent Transfers Transfers: Stand to Sit;Sit to Stand Sit to Stand: 7: Independent Stand to Sit: 7: Independent Ambulation/Gait Ambulation/Gait Assistance: 6: Modified independent (Device/Increase time) Ambulation Distance (Feet): 400 Feet Assistive device: None Ambulation/Gait Assistance Details: no balance disruptions Gait Pattern: Step-through pattern Gait velocity: normal to fast General Gait Details: pt able to start/stop and stop/turn around on cue without any balance problems    Exercises     PT Diagnosis:    PT Problem List:   PT Treatment Interventions:     PT Goals    Visit Information  Last PT Received On: 12/09/11 Assistance Needed: +1    Subjective Data  Subjective: "can I walk to the bathroom by myself now?"  explained to pt need to push call button for safety and IV pole   Prior Functioning  Home Living Lives With: Alone Type of Home: House Home Access: Stairs to enter Home Layout: Two level Alternate Level Stairs-Number of Steps: pt reports stairs but states just a couple and has not had any problem with stairs Home Adaptive Equipment: Walker -  rolling Prior Function Level of Independence: Independent    Cognition  Overall Cognitive Status: Appears within functional limits for tasks assessed/performed Arousal/Alertness: Awake/alert Orientation Level: Appears intact for tasks assessed Behavior During Session: [facility] for tasks performed    Extremity/Trunk Assessment Right Upper Extremity Assessment RUE ROM/Strength/Tone: [facility] for tasks assessed Left Upper Extremity Assessment LUE ROM/Strength/Tone: [facility] for tasks assessed Right Lower Extremity Assessment RLE ROM/Strength/Tone: [facility] for tasks assessed Left Lower Extremity Assessment LLE ROM/Strength/Tone: [facility] for tasks assessed   Balance    End of Session PT - End of Session Activity Tolerance: Patient tolerated treatment well Patient left: in chair;with call bell/phone within reach Nurse Communication: Mobility status   Napolean Sia,KATHrine E 12/09/2011, 10:21 AM Pager: [phone]

## 2011-12-10 DIAGNOSIS — K5900 Constipation, unspecified: Secondary | ICD-10-CM

## 2011-12-10 LAB — CBC
HCT: 23.9 % — ABNORMAL LOW (ref 39.0–52.0)
Hemoglobin: 8.2 g/dL — ABNORMAL LOW (ref 13.0–17.0)
MCH: 31.5 pg (ref 26.0–34.0)
MCHC: 34.3 g/dL (ref 30.0–36.0)
MCV: 91.9 fL (ref 78.0–100.0)
Platelets: 143 10*3/uL — ABNORMAL LOW (ref 150–400)
RBC: 2.6 MIL/uL — ABNORMAL LOW (ref 4.22–5.81)
RDW: 17.1 % — ABNORMAL HIGH (ref 11.5–15.5)
WBC: 3.8 10*3/uL — ABNORMAL LOW (ref 4.0–10.5)

## 2011-12-10 LAB — DIFFERENTIAL
Basophils Absolute: 0 10*3/uL (ref 0.0–0.1)
Basophils Relative: 1 % (ref 0–1)
Eosinophils Absolute: 0.8 10*3/uL — ABNORMAL HIGH (ref 0.0–0.7)
Eosinophils Relative: 21 % — ABNORMAL HIGH (ref 0–5)
Lymphocytes Relative: 33 % (ref 12–46)
Lymphs Abs: 1.2 10*3/uL (ref 0.7–4.0)
Monocytes Absolute: 0.4 10*3/uL (ref 0.1–1.0)
Monocytes Relative: 11 % (ref 3–12)
Neutro Abs: 1.3 10*3/uL — ABNORMAL LOW (ref 1.7–7.7)
Neutrophils Relative %: 35 % — ABNORMAL LOW (ref 43–77)

## 2011-12-10 LAB — BASIC METABOLIC PANEL
BUN: 37 mg/dL — ABNORMAL HIGH (ref 6–23)
CO2: 25 mEq/L (ref 19–32)
Calcium: 12.4 mg/dL — ABNORMAL HIGH (ref 8.4–10.5)
Chloride: 107 mEq/L (ref 96–112)
Creatinine, Ser: 3.37 mg/dL — ABNORMAL HIGH (ref 0.50–1.35)
GFR calc Af Amer: 18 mL/min — ABNORMAL LOW (ref >90)
GFR calc non Af Amer: 16 mL/min — ABNORMAL LOW (ref >90)
Glucose, Bld: 104 mg/dL — ABNORMAL HIGH (ref 70–99)
Potassium: 3.5 mEq/L (ref 3.5–5.1)
Sodium: 140 mEq/L (ref 135–145)

## 2011-12-10 LAB — URIC ACID: Uric Acid, Serum: 7.8 mg/dL (ref 4.0–7.8)

## 2011-12-10 NOTE — Progress Notes (Signed)
Subjective: Lymphoma- appreciate Dr Kalman Drape help,  The patient states he is feeling a little bit better still complains of constipation, no nausea or vomiting however appetite poor  Objective: Vital signs in last 24 hours: Temp:  [97.7 F (36.5 C)-98.6 F (37 C)] 97.9 F (36.6 C) (05/18 0448) Pulse Rate:  [56-60] 56  (05/18 0448) Resp:  [16-17] 16  (05/18 0448) BP: (156-168)/(61-70) 168/68 mmHg (05/18 0448) SpO2:  [97 %-99 %] 97 % (05/18 0448) Weight:  [62.5 kg (137 lb 12.6 oz)] 62.5 kg (137 lb 12.6 oz) (05/18 0448) Weight change: 1.2 kg (2 lb 10.3 oz) Last BM Date: 12/09/11  Intake/Output from previous day: 05/17 0701 - 05/18 0700 In: 4240 [P.O.:640; I.V.:3600] Out: 2675 [Urine:2675] Intake/Output this shift:    Cardio: regular rate and rhythm, S1, S2 normal, no murmur, click, rub or gallop GI: soft, non-tender; bowel sounds normal; no masses,  no organomegaly  Lab Results:  [facility] 12/10/11 0438 12/09/11 0430  WBC 3.8* 3.9*  HGB 8.2* 8.3*  HCT 23.9* 24.3*  PLT 143* 141*   BMET  Basename 12/10/11 0438 12/09/11 0430  NA 140 137  K 3.5 3.5  CL 107 100  CO2 25 30  GLUCOSE 104* 92  BUN 37* 44*  CREATININE 3.37* 3.92*  CALCIUM 12.4* 13.6*    Studies/Results: Dg Chest 2 View  12/08/2011  *RADIOLOGY REPORT*  Clinical Data: Cough.  Hypertension.  Lymphoma.  CHEST - 2 VIEW  Comparison: 11/23/2011  Findings: Peripheral scarring at the right lung base adjacent to remote rib fractures noted.  Attenuated peripheral pulmonary vasculature is compatible with emphysema/COPD.  Biapical pleuroparenchymal scarring noted.  No adenopathy in the chest is observed.  Cardiac and mediastinal contours appear unremarkable.  IMPRESSION:  1. Possible COPD. 2.  Scarring in the lung apices and in the lateral basal segment right lower lobe. 2.  Old right rib deformities from fractures.  Original Report Authenticated By: Dellia Cloud, M.D.    Medications:  Scheduled:   . amiodarone   200 mg Oral Daily  . aspirin EC  81 mg Oral Daily  . clopidogrel  75 mg Oral Daily  . digoxin  125 mcg Oral Daily  . docusate sodium  100 mg Oral BID  . feeding supplement  237 mL Oral Q1200  . feeding supplement  1 Container Oral BID BM  . heparin  5,000 Units Subcutaneous Q8H  . omega-3 acid ethyl esters  1 g Oral Daily  . senna  1 tablet Oral BID  . sodium chloride  3 mL Intravenous Q12H  . DISCONTD: calcitonin  240 Units Intramuscular q12n4p    Assessment/Plan: 1. Lymphoma- calcium and creatinine improving I will discuss re transfer to Dr Kalman Drape service on Monday with Dr Earl Gala. 2. Constipation will order miralax  LOS: 2 days   [facility] 12/10/2011, 8:47 AM

## 2011-12-10 NOTE — Progress Notes (Addendum)
Episode of bigemeny.  Checked on patient who was ambulating in his room and returning to bed.  MD on call made aware.

## 2011-12-10 NOTE — Progress Notes (Addendum)
Per MT, patient had a 2 sec pause. Checked on patient and he states he was moving around, going to the bathroom.  BP 153/54, HR 56, 97% on RA.  Denies any discomfort.  MD on call made aware.

## 2011-12-10 NOTE — Progress Notes (Signed)
IP PROGRESS NOTE  Subjective:   He reports anorexia. He is frustrated by not being allowed to ambulate on his own.  Objective: Vital signs in last 24 hours: Blood pressure 168/68, pulse 56, temperature 97.9 F (36.6 C), temperature source Oral, resp. rate 16, height 5\' 10"  (1.778 m), weight 137 lb 12.6 oz (62.5 kg), SpO2 97.00%.  Intake/Output from previous day: 05/17 0701 - 05/18 0700 In: 4240 [P.O.:640; I.V.:3600] Out: 2675 [Urine:2675]  Physical Exam:  HEENT: No thrush Lungs: Clear, no respiratory distress Cardiac: Regular rate and rhythm Abdomen: Nontender, no hepatosplenomegaly Extremities: No leg edema Neurologic: Alert and oriented    Lab Results:  [facility] 12/10/11 0438 12/09/11 0430  WBC 3.8* 3.9*  HGB 8.2* 8.3*  HCT 23.9* 24.3*  PLT 143* 141*    BMET  Basename 12/10/11 0438 12/09/11 0430  NA 140 137  K 3.5 3.5  CL 107 100  CO2 25 30  GLUCOSE 104* 92  BUN 37* 44*  CREATININE 3.37* 3.92*  CALCIUM 12.4* 13.6*   LDH 146, uric acid 7.8  Studies/Results: Dg Chest 2 View  12/08/2011  *RADIOLOGY REPORT*  Clinical Data: Cough.  Hypertension.  Lymphoma.  CHEST - 2 VIEW  Comparison: 11/23/2011  Findings: Peripheral scarring at the right lung base adjacent to remote rib fractures noted.  Attenuated peripheral pulmonary vasculature is compatible with emphysema/COPD.  Biapical pleuroparenchymal scarring noted.  No adenopathy in the chest is observed.  Cardiac and mediastinal contours appear unremarkable.  IMPRESSION:  1. Possible COPD. 2.  Scarring in the lung apices and in the lateral basal segment right lower lobe. 2.  Old right rib deformities from fractures.  Original Report Authenticated By: Dellia Cloud, M.D.    Medications: I have reviewed the patient's current medications.  Assessment/Plan:  1. 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  2. Hypercalcemia of malignancy-status post pamidronate therapy  and maintained on intravenous hydration. Improved.  3. Renal failure-likely related to dehydration in the setting of hypercalcemia, improved.  4. Anemia secondary to non-Hodgkin's lymphoma-stable  5. Constipation-likely related to hypercalcemia  6. History of atrial fibrillation  7. COPD  He appears stable.  Recommendations: 1. Continue intravenous hydration with  followup of the serum calcium level. The calcitonin can be discontinued .  2. Staging PET scan-this has been scheduled for 12/12/2011  3. Begin allopurinol 12/11/2011 in anticipation of chemotherapy to begin on 12/12/2011  4. Transferred to the third for oncology unit on May 19 or May 20 in anticipation of inpatient chemotherapy.  Oncology will  continue to follow Mr. Cichy daily. I will be glad to transfer him to the oncology service if Dr. Earl Gala feels this is appropriate.  The plan is to begin systemic chemotherapy on 12/12/2011 or 12/13/2011. Hopefully there will be further improvement in his renal function prior to beginning chemotherapy.   LOS: 2 days   Tylar Merendino  12/10/2011, 8:01 AM

## 2011-12-11 LAB — BASIC METABOLIC PANEL
BUN: 28 mg/dL — ABNORMAL HIGH (ref 6–23)
Calcium: 10.8 mg/dL — ABNORMAL HIGH (ref 8.4–10.5)
Creatinine, Ser: 2.74 mg/dL — ABNORMAL HIGH (ref 0.50–1.35)
GFR calc Af Amer: 24 mL/min — ABNORMAL LOW (ref >90)
GFR calc non Af Amer: 20 mL/min — ABNORMAL LOW (ref >90)
Potassium: 3.2 mEq/L — ABNORMAL LOW (ref 3.5–5.1)

## 2011-12-11 MED ORDER — SODIUM CHLORIDE 0.9 % IV SOLN
INTRAVENOUS | Status: DC
  Administered 2011-12-11 – 2011-12-14 (×6): via INTRAVENOUS
  Filled 2011-12-11 (×8): qty 1000

## 2011-12-11 MED ORDER — POTASSIUM CHLORIDE CRYS ER 20 MEQ PO TBCR
40.0000 meq | EXTENDED_RELEASE_TABLET | Freq: Once | ORAL | Status: AC
  Administered 2011-12-11: 40 meq via ORAL
  Filled 2011-12-11: qty 2

## 2011-12-11 MED ORDER — POTASSIUM CHLORIDE CRYS ER 20 MEQ PO TBCR
40.0000 meq | EXTENDED_RELEASE_TABLET | Freq: Three times a day (TID) | ORAL | Status: DC
  Administered 2011-12-11 (×3): 40 meq via ORAL
  Filled 2011-12-11 (×6): qty 2

## 2011-12-11 NOTE — Progress Notes (Signed)
Dr. Pete Glatter had discussed transferring Thomas Doyle to 3rd floor and to Dr. Kalman Drape service on 5/20 with Dr. Truett Perna. This is fine with me. Given the improvement in his numbers, he can be transferred to 3rd floor oncology at any time. I really don't have anything to add. I will not plan on seeing Thomas Doyle on 5/20 unless a problem seems to arise overnight. I will check EPIC in the morning.    Please call if we can help during the rest of his hospitalization (cell [phone]).

## 2011-12-11 NOTE — Progress Notes (Signed)
Patients Hr dropped in the 40's.  Checked vitals. Patient talking with visitors, denies SOB or dizziness.

## 2011-12-11 NOTE — Progress Notes (Signed)
Patient ambulated in the hall approximately 200 ft.  Up to the chair during dinner hour.

## 2011-12-11 NOTE — Progress Notes (Signed)
IP PROGRESS NOTE  Subjective:   He feels better today. He is ambulating.  Objective: Vital signs in last 24 hours: Blood pressure 126/53, pulse 59, temperature 98.1 F (36.7 C), temperature source Oral, resp. rate 18, height 5\' 10"  (1.778 m), weight 144 lb 6.4 oz (65.5 kg), SpO2 96.00%.  Intake/Output from previous day: 05/18 0701 - 05/19 0700 In: 4047.5 [P.O.:360; I.V.:3687.5] Out: 300 [Urine:300]  Physical Exam:  HEENT: No thrush Lungs: Clear, no respiratory distress Cardiac: Regular rate and rhythm Abdomen: Nontender, no hepatosplenomegaly Extremities: No leg edema Neurologic: Alert and oriented    Lab Results:  Basename 12/10/11 0438 12/09/11 0430  WBC 3.8* 3.9*  HGB 8.2* 8.3*  HCT 23.9* 24.3*  PLT 143* 141*    BMET  Basename 12/11/11 0425 12/10/11 0438  NA 140 140  K 3.2* 3.5  CL 108 107  CO2 24 25  GLUCOSE 92 104*  BUN 28* 37*  CREATININE 2.74* 3.37*  CALCIUM 10.8* 12.4*   LDH 146, uric acid 7.8  Studies/Results: No results found.  Medications: I have reviewed the patient's current medications.  Assessment/Plan:  1. 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  2. Hypercalcemia of malignancy-status post pamidronate therapy and maintained on intravenous hydration. Improved.  3. Renal failure-likely related to dehydration in the setting of hypercalcemia, continued improvement  4. Anemia secondary to non-Hodgkin's lymphoma-stable  5. Constipation-likely related to hypercalcemia  6. History of atrial fibrillation  7. COPD  He appears stable.  Recommendations: 1. Continue intravenous hydration , decrease IV fluids and replete potassium  2. Staging PET scan-this has been scheduled for 12/12/2011  3. Begin allopurinol 12/12/2011.  4. Transferred to the third for oncology unit if okay with Dr. Earl Gala  5. Echocardiogram to assess LV function prior to chemotherapy  His clinical status has  improved with treatment of the hypercalcemia. He will undergo a staging evaluation on 12/12/2011. The plan is to begin systemic chemotherapy on 12/13/2011 if the renal function continues to improve.   LOS: 3 days   Deaisa Merida  12/11/2011, 8:27 AM

## 2011-12-11 NOTE — Progress Notes (Signed)
K+ is 3.2 this am on labs-text'd M. Lynch,NP-new orders to give KCL po ordered

## 2011-12-11 NOTE — Progress Notes (Addendum)
Subjective: [name] had a fairly good night. No pain no cough or trouble breathing  Objective: Vital signs in last 24 hours: Temp:  [97.7 F (36.5 C)-98.1 F (36.7 C)] 98.1 F (36.7 C) (05/19 0507) Pulse Rate:  [53-59] 59  (05/19 0507) Resp:  [18] 18  (05/19 0507) BP: (126-158)/(53-67) 126/53 mmHg (05/19 0507) SpO2:  [96 %-98 %] 96 % (05/19 0507) Weight:  [65.5 kg (144 lb 6.4 oz)] 65.5 kg (144 lb 6.4 oz) (05/19 0507) Weight change: 3 kg (6 lb 9.8 oz) Last BM Date: 12/09/11  Intake/Output from previous day: 05/18 0701 - 05/19 0700 In: 4047.5 [P.O.:360; I.V.:3687.5] Out: 300 [Urine:300] Intake/Output this shift:    Resp: clear to auscultation bilaterally Cardio: regular rate and rhythm, S1, S2 normal, no murmur, click, rub or gallop  Lab Results:  Basename 12/10/11 0438 12/09/11 0430  WBC 3.8* 3.9*  HGB 8.2* 8.3*  HCT 23.9* 24.3*  PLT 143* 141*   BMET  Basename 12/11/11 0425 12/10/11 0438  NA 140 140  K 3.2* 3.5  CL 108 107  CO2 24 25  GLUCOSE 92 104*  BUN 28* 37*  CREATININE 2.74* 3.37*  CALCIUM 10.8* 12.4*    Studies/Results: No results found.  Medications:  Scheduled:   . amiodarone  200 mg Oral Daily  . aspirin EC  81 mg Oral Daily  . clopidogrel  75 mg Oral Daily  . digoxin  125 mcg Oral Daily  . docusate sodium  100 mg Oral BID  . feeding supplement  237 mL Oral Q1200  . feeding supplement  1 Container Oral BID BM  . heparin  5,000 Units Subcutaneous Q8H  . omega-3 acid ethyl esters  1 g Oral Daily  . potassium chloride  40 mEq Oral Once  . senna  1 tablet Oral BID  . sodium chloride  3 mL Intravenous Q12H    Assessment/Plan: 1. Lymphoma creatinine and calcium slowly improving and he is feeling much better.  I will leave message for Dr Thomas Doyle tonight regarding transfer to Dr Thomas Doyle service and proceeding with evaluation and treatment 2. Potassium is lower I will increase dosage  LOS: 3 days   [facility] 12/11/2011, 8:40  AM

## 2011-12-12 ENCOUNTER — Ambulatory Visit: Payer: Medicare Other | Admitting: Oncology

## 2011-12-12 ENCOUNTER — Inpatient Hospital Stay (HOSPITAL_COMMUNITY): Payer: Medicare Other

## 2011-12-12 DIAGNOSIS — R630 Anorexia: Secondary | ICD-10-CM

## 2011-12-12 DIAGNOSIS — D649 Anemia, unspecified: Secondary | ICD-10-CM

## 2011-12-12 LAB — BASIC METABOLIC PANEL
BUN: 23 mg/dL (ref 6–23)
Calcium: 11.2 mg/dL — ABNORMAL HIGH (ref 8.4–10.5)
Creatinine, Ser: 2.62 mg/dL — ABNORMAL HIGH (ref 0.50–1.35)
GFR calc Af Amer: 25 mL/min — ABNORMAL LOW (ref >90)
GFR calc non Af Amer: 22 mL/min — ABNORMAL LOW (ref >90)
Potassium: 4.6 mEq/L (ref 3.5–5.1)

## 2011-12-12 LAB — PTH, INTACT AND CALCIUM: Calcium, Total (PTH): 13 mg/dL — ABNORMAL HIGH (ref 8.4–10.5)

## 2011-12-12 MED ORDER — POLYETHYLENE GLYCOL 3350 17 G PO PACK
17.0000 g | PACK | Freq: Every day | ORAL | Status: DC
  Administered 2011-12-12: 17 g via ORAL
  Filled 2011-12-12 (×3): qty 1

## 2011-12-12 MED ORDER — FLUDEOXYGLUCOSE F - 18 (FDG) INJECTION
18.9000 | Freq: Once | INTRAVENOUS | Status: AC | PRN
  Administered 2011-12-12: 18.9 via INTRAVENOUS

## 2011-12-12 MED ORDER — ENOXAPARIN SODIUM 30 MG/0.3ML ~~LOC~~ SOLN
30.0000 mg | SUBCUTANEOUS | Status: DC
  Administered 2011-12-12 – 2011-12-13 (×2): 30 mg via SUBCUTANEOUS
  Filled 2011-12-12 (×3): qty 0.3

## 2011-12-12 NOTE — Progress Notes (Signed)
I noted some comments in chart about whether he was going to go on with treatment so I stopped by to talk to him. In the interim, he has decided to proceed. I told him I thought that was a good idea.   I can help out in any way needed. I would think that he should be on Dr. Kalman Drape service since the main issue now is chemotherapy.

## 2011-12-12 NOTE — Progress Notes (Signed)
IP PROGRESS NOTE  Subjective:   He continues to have anorexia. He reports nausea when eating "solids ". He remains constipated. Mr. Collier indicated he may decide against treatment.  Objective: Vital signs in last 24 hours: Blood pressure 166/74, pulse 56, temperature 98.2 F (36.8 C), temperature source Oral, resp. rate 18, height 5\' 10"  (1.778 m), weight 147 lb 11.3 oz (67 kg), SpO2 96.00%.  Intake/Output from previous day: 05/19 0701 - 05/20 0700 In: 1742.5 [P.O.:240; I.V.:1502.5] Out: 1200 [Urine:1200]  Physical Exam:  HEENT: No thrush Lungs: Clear, no respiratory distress Cardiac: Regular rate and rhythm Abdomen: Nontender, no hepatosplenomegaly Extremities: Trace edema at the feet Neurologic: Alert and oriented    Lab Results:  [facility] 12/10/11 0438  WBC 3.8*  HGB 8.2*  HCT 23.9*  PLT 143*    BMET  Basename 12/12/11 0425 12/11/11 0425  NA 141 140  K 4.6 3.2*  CL 112 108  CO2 22 24  GLUCOSE 86 92  BUN 23 28*  CREATININE 2.62* 2.74*  CALCIUM 11.2* 10.8*   LDH 146, uric acid 7.8  Studies/Results: No results found.  Medications: I have reviewed the patient's current medications.  Assessment/Plan:  1. 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  2. Hypercalcemia of malignancy-status post pamidronate therapy and maintained on intravenous hydration. Improved.  3. Renal failure-likely related to dehydration in the setting of hypercalcemia, continued improvement  4. Anemia secondary to non-Hodgkin's lymphoma-  5. Constipation-likely related to hypercalcemia, we will increase the laxative regimen  6. History of atrial fibrillation  7. COPD  He appears stable. I discussed the situation with Mr. Gelles this morning. He agrees to remain in the hospital and proceed with systemic therapy. He will undergo a staging evaluation to date with the plan to begin systemic therapy on may 21st 2013.  Plan: 1. PET scan  today 2. Echocardiogram today 3. Transferred her for oncology unit 4. Begin allopurinol 5. Give another dose of pamidronate if the calcium is higher on 12/13/2011.  He will most likely be treated with CVP-rituximab.      LOS: 4 days   Faduma Cho  12/12/2011, 9:09 AM

## 2011-12-12 NOTE — Care Management Note (Unsigned)
    Page 1 of 1   12/12/2011     4:06:58 PM   CARE MANAGEMENT NOTE 12/12/2011  Patient:  Thomas Doyle, Thomas Doyle   Account Number:  [id number]  Date Initiated:  12/12/2011  Documentation initiated by:  Lanier Clam  Subjective/Objective Assessment:   ADMITTED W/HYPERCALCEMIA.ZO:XWRUEAVW.     Action/Plan:   FROM HOME ALONE.WILL STAY W/HIS DAUGHTER @ D/C(LIVES IN [place])   Anticipated DC Date:  12/16/2011   Anticipated DC Plan:  HOME/SELF CARE      DC Planning Services  CM consult      Choice offered to / List presented to:             Status of service:  In process, will continue to follow Medicare Important Message given?   (If response is "NO", the following Medicare IM given date fields will be blank) Date Medicare IM given:   Date Additional Medicare IM given:    Discharge Disposition:    Per UR Regulation:  Reviewed for med. necessity/level of care/duration of stay  If discussed at Long Length of Stay Meetings, dates discussed:    Comments:  12/12/11 Thomas Spadafore RN,BSN NCM [phone] TRANSFER TO PALLIATIVE CARE UNIT.

## 2011-12-13 ENCOUNTER — Telehealth: Payer: Self-pay | Admitting: Oncology

## 2011-12-13 ENCOUNTER — Other Ambulatory Visit: Payer: Self-pay | Admitting: *Deleted

## 2011-12-13 DIAGNOSIS — D72819 Decreased white blood cell count, unspecified: Secondary | ICD-10-CM

## 2011-12-13 DIAGNOSIS — D696 Thrombocytopenia, unspecified: Secondary | ICD-10-CM

## 2011-12-13 DIAGNOSIS — C8510 Unspecified B-cell lymphoma, unspecified site: Secondary | ICD-10-CM

## 2011-12-13 LAB — COMPREHENSIVE METABOLIC PANEL
BUN: 25 mg/dL — ABNORMAL HIGH (ref 6–23)
Calcium: 11 mg/dL — ABNORMAL HIGH (ref 8.4–10.5)
GFR calc Af Amer: 27 mL/min — ABNORMAL LOW (ref >90)
GFR calc non Af Amer: 23 mL/min — ABNORMAL LOW (ref >90)
Glucose, Bld: 91 mg/dL (ref 70–99)
Total Protein: 4.9 g/dL — ABNORMAL LOW (ref 6.0–8.3)

## 2011-12-13 LAB — DIFFERENTIAL
Basophils Absolute: 0 10*3/uL (ref 0.0–0.1)
Basophils Relative: 0 % (ref 0–1)
Lymphocytes Relative: 34 % (ref 12–46)
Monocytes Relative: 5 % (ref 3–12)
Neutro Abs: 1.3 10*3/uL — ABNORMAL LOW (ref 1.7–7.7)
Neutrophils Relative %: 42 % — ABNORMAL LOW (ref 43–77)

## 2011-12-13 LAB — HEPATITIS B SURFACE ANTIGEN: Hepatitis B Surface Ag: NEGATIVE

## 2011-12-13 LAB — CBC
HCT: 22 % — ABNORMAL LOW (ref 39.0–52.0)
Hemoglobin: 7.4 g/dL — ABNORMAL LOW (ref 13.0–17.0)
RDW: 17.8 % — ABNORMAL HIGH (ref 11.5–15.5)
WBC: 3.2 10*3/uL — ABNORMAL LOW (ref 4.0–10.5)

## 2011-12-13 MED ORDER — DIPHENHYDRAMINE HCL 50 MG PO CAPS
50.0000 mg | ORAL_CAPSULE | Freq: Once | ORAL | Status: AC
  Administered 2011-12-13: 50 mg via ORAL
  Filled 2011-12-13: qty 1

## 2011-12-13 MED ORDER — PREDNISONE 50 MG PO TABS
100.0000 mg | ORAL_TABLET | Freq: Every day | ORAL | Status: DC
  Administered 2011-12-13 – 2011-12-14 (×2): 100 mg via ORAL
  Filled 2011-12-13 (×3): qty 2

## 2011-12-13 MED ORDER — ALLOPURINOL 100 MG PO TABS
100.0000 mg | ORAL_TABLET | Freq: Every day | ORAL | Status: DC
  Administered 2011-12-13 – 2011-12-14 (×2): 100 mg via ORAL
  Filled 2011-12-13 (×2): qty 1

## 2011-12-13 MED ORDER — SODIUM CHLORIDE 0.9 % IV SOLN
16.0000 mg | Freq: Once | INTRAVENOUS | Status: AC
  Administered 2011-12-13: 16 mg via INTRAVENOUS
  Filled 2011-12-13: qty 8

## 2011-12-13 MED ORDER — SODIUM CHLORIDE 0.9 % IV SOLN
Freq: Once | INTRAVENOUS | Status: AC
  Administered 2011-12-13: 10:00:00 via INTRAVENOUS

## 2011-12-13 MED ORDER — MEPERIDINE HCL 25 MG/ML IJ SOLN: 12.5000 mg | INTRAMUSCULAR | Status: DC | PRN

## 2011-12-13 MED ORDER — VINCRISTINE SULFATE CHEMO INJECTION 1 MG/ML
2.0000 mg | Freq: Once | INTRAVENOUS | Status: AC
  Administered 2011-12-13: 2 mg via INTRAVENOUS
  Filled 2011-12-13: qty 2

## 2011-12-13 MED ORDER — ACETAMINOPHEN 325 MG PO TABS
650.0000 mg | ORAL_TABLET | Freq: Once | ORAL | Status: AC
  Administered 2011-12-13: 650 mg via ORAL
  Filled 2011-12-13: qty 1

## 2011-12-13 MED ORDER — SODIUM CHLORIDE 0.9 % IV SOLN
375.0000 mg/m2 | Freq: Once | INTRAVENOUS | Status: AC
  Administered 2011-12-13: 700 mg via INTRAVENOUS
  Filled 2011-12-13: qty 70

## 2011-12-13 MED ORDER — SODIUM CHLORIDE 0.9 % IV SOLN
600.0000 mg/m2 | Freq: Once | INTRAVENOUS | Status: AC
  Administered 2011-12-13: 1080 mg via INTRAVENOUS
  Filled 2011-12-13: qty 54

## 2011-12-13 NOTE — Progress Notes (Signed)
MD review of PET scan report. Needs lab on 5/24 and lab/office visit on 5/29. POF to scheduler.

## 2011-12-13 NOTE — Progress Notes (Signed)
IP PROGRESS NOTE  Subjective:   No complaint today. He is ready to begin chemotherapy.  Objective: Vital signs in last 24 hours: Blood pressure 158/67, pulse 65, temperature 98.6 F (37 C), temperature source Oral, resp. rate 20, height 5\' 10"  (1.778 m), weight 146 lb 13.2 oz (66.6 kg), SpO2 92.00%.  Intake/Output from previous day: 05/20 0701 - 05/21 0700 In: 2973.8 [P.O.:1200; I.V.:1773.8] Out: 700 [Urine:700]  Physical Exam:  HEENT: No thrush Lungs: Clear, no respiratory distress Cardiac: Regular rate and rhythm Abdomen: Nontender, no hepatosplenomegaly Extremities: Trace edema at the feet Neurologic: Alert and oriented    Lab Results:  [facility] 12/13/11 0347  WBC 3.2*  HGB 7.4*  HCT 22.0*  PLT 109*   ANC 1.3  BMET  Basename 12/13/11 0347 12/12/11 0425  NA 139 141  K 4.0 4.6  CL 109 112  CO2 21 22  GLUCOSE 91 86  BUN 25* 23  CREATININE 2.49* 2.62*  CALCIUM 11.0* 11.2*    Studies/Results: Nm Pet Image Initial (pi) Skull Base To Thigh  12/12/2011  *RADIOLOGY REPORT*  Clinical Data: Initial treatment strategy for staging of lymphoma. Non-Hodgkin's type lymphoma.  Involving bone marrow.  NUCLEAR MEDICINE PET CT SKULL BASE TO THIGH  Technique:  Technique:  18.9 mCi F-18 FDG was injected intravenously. CT data was obtained and used for attenuation correction and anatomic localization only.  (This was not acquired as a diagnostic CT examination.) Additional exam technical data entered on technologist worksheet.  Comparison: Biopsy of 11/25/2011.  Abdominal pelvic CT of 01/10/2008.  Findings: Neck: No abnormal nodal activity.  A focus of hypermetabolism within the posterior supraglottic larynx, at level of the piriform sinuses.  This is without definite CT correlate, and measures a S.U.V. max of 8.6 on image 50.  Chest:  No abnormal extra osseous activity.  Abdomen/Pelvis:  Mild hypermetabolic retroperitoneal adenopathy. Index left periaortic node measures 1.1 cm and a  S.U.V. max of 3.6 on image 163.  [place]:  Diffuse heterogeneous marrow hypermetabolism, consistent with the clinical history of bone marrow involvement with lymphoma.  CT images performed for attenuation correction demonstrate cerebral atrophy, with resultant prominence of the extraaxial spaces adjacent the frontal lobes.  Mucosal thickening of the right maxillary sinus.  No cervical adenopathy.  Esophageal dilatation.  Mild cardiomegaly.  Anemia, as evidenced by hypoattenuation in the intravascular space.  Small bilateral pleural effusions.  Mild loculation involving the right-sided effusion, include within the right major fissure.  Scarring at the apices.  Mild dependent volume loss within the lung bases.  Mild nonspecific nodularity within the lingula.  Up to 7 mm on image 110.  Borderline splenomegaly.  The spleen measures 10.8 x 12.9 cm transverse and 11.7 cm cranial caudal.  Not significantly hypermetabolic.  Trace cul-de-sac fluid.  IMPRESSION: 1.  Hypermetabolic abdominal adenopathy, consistent with active lymphoma. 2.  Heterogeneous marrow hypermetabolism, consistent with a clinical history of marrow involvement. 3.  No nodal disease identified within the neck or chest.  4. A focus of metabolism about the supraglottic larynx.  No CT correlate.  Suspect physiologic.  Attention on follow-up versus further evaluation with direct visualization suggested. 5. Esophageal dilatation suggests dysmotility or gastroesophageal reflux. 6.  New bilateral pleural effusions since 12/08/2011.  Mild loculation involving the right effusion.  Concurrent trace cul-de- sac fluid.  Question fluid overload. 7.  Borderline splenomegaly, without specific evidence of splenic lymphoma.  Original Report Authenticated By: Consuello Bossier, M.D.    Medications: I have reviewed the patient's  current medications.  Assessment/Plan:  1. 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 Staging PET  scan on 12/12/2011 revealed a hypermetabolic abdominal lymph node, hypermetabolism in the bone marrow, and borderline splenomegaly without evidence of splenic lymphoma.  2. Hypercalcemia of malignancy-status post pamidronate therapy and maintained on intravenous hydration. Improved.  3. Renal failure-likely related to dehydration in the setting of hypercalcemia, continued improvement  4. Anemia/thrombocytopenia/leukopenia secondary to non-Hodgkin's lymphoma-  5. Constipation-likely related to hypercalcemia-he had a bowel movement following MiraLAX yesterday.  6. History of atrial fibrillation, echocardiogram with a left ventricular ejection fraction of 55-60% on 12/12/2011.  7. COPD  He appears stable. The plan is to begin systemic therapy today. We decided to proceed with CVP-rituximab. I reviewed the potential toxicities associated with this regimen including the chance for nausea/vomiting, mucositis, alopecia, hematologic toxicity, neuropathy, diarrhea, and constipation. We discussed the vesicant property of vincristine. We reviewed the hematologic toxicity, allergic reaction, reactivation of hepatitis, and leukoencephalopathy is associated with rituximab. He agrees to proceed.  We will consider adding etoposide or Novantrone beginning with cycle 2.  Plan: 1. CVP-rituximab today 2. Discharge planning for may 22nd 2013.        LOS: 5 days   Lashawne Dura  12/13/2011, 7:51 AM

## 2011-12-13 NOTE — Telephone Encounter (Signed)
S/w pt dtr janice re appts for 5/24 and 5/29.

## 2011-12-14 MED ORDER — ALLOPURINOL 100 MG PO TABS: 100.0000 mg | ORAL_TABLET | Freq: Every day | ORAL | Status: DC

## 2011-12-14 MED ORDER — PREDNISONE 20 MG PO TABS: 80.0000 mg | ORAL_TABLET | Freq: Every day | ORAL | Status: DC

## 2011-12-14 MED ORDER — PREDNISONE 20 MG PO TABS
80.0000 mg | ORAL_TABLET | Freq: Every day | ORAL | Status: DC
  Filled 2011-12-14: qty 1

## 2011-12-14 MED ORDER — POLYETHYLENE GLYCOL 3350 17 G PO PACK: 17.0000 g | PACK | Freq: Every day | ORAL | Status: AC

## 2011-12-14 NOTE — Discharge Summary (Signed)
Physician Discharge Summary  Patient ID: Thomas Doyle @ATTENDINGNPI @ MRN: [id number] DOB/AGE: 76/06/1929 76 y.o.  Admit date: 12/08/2011 Discharge date: 12/14/2011  Discharge Diagnoses:  Principal Problem:  *Hypercalcemia of malignancy-improved following intravenous hydration and pamidronate Active Problems:  Normocytic anemia-secondary to non-Hodgkin's lymphoma  Acute renal failure-improved  COPD (chronic obstructive pulmonary disease)  Large B-cell lymphoma--status post cycle 1 of CVP/rituximab on may the 21st 2013  A-fib  Constipation-relieved with laxatives during this hospital   Discharged Condition: Improved  Discharge Labs:  none on the day of discharge  Significant Diagnostic Studies: PET scan may 20th 2013  Consults: Dr. Earl Gala  Procedures:  Infusional chemotherapy  Disposition: 01-Home or Self Care   Medication List  As of 12/14/2011  8:50 AM   STOP taking these medications         BENICAR 20 MG tablet      candesartan 8 MG tablet      chlorpheniramine-HYDROcodone 10-8 MG/5ML Lqcr      ibuprofen 200 MG tablet         TAKE these medications         allopurinol 100 MG tablet   Commonly known as: ZYLOPRIM   Take 1 tablet (100 mg total) by mouth daily.take for 5 days and then stop       amiodarone 200 MG tablet   Commonly known as: PACERONE   Take 200 mg by mouth daily.      aspirin 81 MG tablet   Take 81 mg by mouth daily.      clopidogrel 75 MG tablet   Commonly known as: PLAVIX   Take 75 mg by mouth daily.      digoxin 0.125 MG tablet   Commonly known as: LANOXIN   Take 125 mcg by mouth daily.      fish oil-omega-3 fatty acids 1000 MG capsule   Take 1 g by mouth daily.      polyethylene glycol packet   Commonly known as: MIRALAX / GLYCOLAX   Take 17 g by mouth daily.      predniSONE 20 MG tablet   Commonly known as: DELTASONE   Take 4 tablets (80 mg total) by mouth daily.take for 3 days starting on 12/15/2011 and then stop               Follow-up Information    Follow up with Thornton Papas, MD. (lab visit 5/24, office week of 5/27  office will call you with times)    Contact information:   [address] [place] [address] [phone]          Hospital Course: He was admitted with symptomatic hypercalcemia on Dec 08 2011. The hypercalcemia was treated with intravenous hydration, calcitonin, and pamidronate. His clinical status improved following correction of the hypercalcemia. He was also noted to have acute renal failure on hospital admission. This was felt to be related to dehydration in the setting of hypercalcemia. The creatinine was improved at the time of discharge, though the creatinine was not back to baseline.  A bone marrow biopsy from 11/25/2011 was consistent with a diagnosis of large B-cell lymphoma (CD20 positive) involving the bone marrow. There was no palpable lymphadenopathy and the LDH was normal. A staging PET scan revealed evidence of bone marrow involvement with lymphoma and a single hypermetabolic abdominal lymph node.  I discussed treatment options at length with Mr. Diskin. He was reluctant to proceed with medication that could cause cardiac toxicity. He had been considered  for the bendamustine/rituximab study prior to hospital admission. We decided to proceed with CVP-rituximab. He completed a first cycle of systemic therapy on may 21st 2013. He tolerated the chemotherapy without significant acute toxicity.  Mr. Hunley appeared stable for discharge on the morning of 12/14/2011. He will complete an outpatient course of prednisone. Followup will be arranged in the office for a lab visit 12/16/2011 and an office visit during the week of 12/19/2011.     Discharge Orders    Future Appointments: Provider: Department: Dept Phone: Center:   12/16/2011 2:45 PM Alvina Filbert [facility] [phone] None   12/21/2011 11:45 AM Sherrie Mustache [facility] [phone]  None   12/21/2011 12:15 PM Ladene Artist, MD [facility] [phone] None   12/21/2011 2:30 PM Anabel Bene, RD [facility] [phone] None      Signed: Thornton Papas 12/14/2011, 8:50 AM

## 2011-12-15 ENCOUNTER — Other Ambulatory Visit (HOSPITAL_COMMUNITY): Payer: Medicare Other

## 2011-12-16 ENCOUNTER — Other Ambulatory Visit (HOSPITAL_BASED_OUTPATIENT_CLINIC_OR_DEPARTMENT_OTHER): Payer: Medicare Other | Admitting: Lab

## 2011-12-16 ENCOUNTER — Other Ambulatory Visit: Payer: Self-pay | Admitting: Certified Registered Nurse Anesthetist

## 2011-12-16 DIAGNOSIS — C8510 Unspecified B-cell lymphoma, unspecified site: Secondary | ICD-10-CM

## 2011-12-16 DIAGNOSIS — C8589 Other specified types of non-Hodgkin lymphoma, extranodal and solid organ sites: Secondary | ICD-10-CM

## 2011-12-16 LAB — BASIC METABOLIC PANEL
BUN: 41 mg/dL — ABNORMAL HIGH (ref 6–23)
CO2: 23 mEq/L (ref 19–32)
Glucose, Bld: 139 mg/dL — ABNORMAL HIGH (ref 70–99)
Potassium: 3.8 mEq/L (ref 3.5–5.3)

## 2011-12-21 ENCOUNTER — Telehealth: Payer: Self-pay | Admitting: *Deleted

## 2011-12-21 ENCOUNTER — Ambulatory Visit (HOSPITAL_BASED_OUTPATIENT_CLINIC_OR_DEPARTMENT_OTHER): Payer: Medicare Other | Admitting: Oncology

## 2011-12-21 ENCOUNTER — Other Ambulatory Visit: Payer: Self-pay | Admitting: *Deleted

## 2011-12-21 ENCOUNTER — Encounter: Payer: Medicare Other | Admitting: Nutrition

## 2011-12-21 ENCOUNTER — Other Ambulatory Visit: Payer: Medicare Other | Admitting: Lab

## 2011-12-21 VITALS — BP 190/76 | HR 75 | Temp 98.7°F | Ht 70.0 in | Wt 147.9 lb

## 2011-12-21 DIAGNOSIS — C8510 Unspecified B-cell lymphoma, unspecified site: Secondary | ICD-10-CM

## 2011-12-21 DIAGNOSIS — D638 Anemia in other chronic diseases classified elsewhere: Secondary | ICD-10-CM

## 2011-12-21 DIAGNOSIS — N19 Unspecified kidney failure: Secondary | ICD-10-CM

## 2011-12-21 DIAGNOSIS — C8589 Other specified types of non-Hodgkin lymphoma, extranodal and solid organ sites: Secondary | ICD-10-CM

## 2011-12-21 LAB — CBC WITH DIFFERENTIAL/PLATELET
BASO%: 0.3 % (ref 0.0–2.0)
EOS%: 13.4 % — ABNORMAL HIGH (ref 0.0–7.0)
HCT: 21.4 % — ABNORMAL LOW (ref 38.4–49.9)
LYMPH%: 25.1 % (ref 14.0–49.0)
MCH: 31.9 pg (ref 27.2–33.4)
MCHC: 34.3 g/dL (ref 32.0–36.0)
MCV: 93.1 fL (ref 79.3–98.0)
MONO#: 0.1 10*3/uL (ref 0.1–0.9)
MONO%: 5.1 % (ref 0.0–14.0)
NEUT%: 56.1 % (ref 39.0–75.0)
Platelets: 167 10*3/uL (ref 140–400)
RBC: 2.3 10*6/uL — ABNORMAL LOW (ref 4.20–5.82)
WBC: 2.9 10*3/uL — ABNORMAL LOW (ref 4.0–10.3)
nRBC: 0 % (ref 0–0)

## 2011-12-21 LAB — COMPREHENSIVE METABOLIC PANEL
ALT: 19 U/L (ref 0–53)
AST: 11 U/L (ref 0–37)
Alkaline Phosphatase: 75 U/L (ref 39–117)
BUN: 18 mg/dL (ref 6–23)
Calcium: 8.6 mg/dL (ref 8.4–10.5)
Creatinine, Ser: 1.63 mg/dL — ABNORMAL HIGH (ref 0.50–1.35)
Total Bilirubin: 0.7 mg/dL (ref 0.3–1.2)

## 2011-12-21 MED ORDER — MAGIC MOUTHWASH: 5.0000 mL | ORAL | Status: DC | PRN

## 2011-12-21 NOTE — Progress Notes (Signed)
   [facility]    OFFICE PROGRESS NOTE   INTERVAL HISTORY:   Thomas Doyle returns as scheduled. He completed a first cycle of CVP-rituximab on 12/13/2011. He tolerated the chemotherapy well. No nausea/vomiting or mouth sores. He reports an improved energy level and appetite. He developed bilateral leg and left arm swelling upon discharge from the hospital. This has improved.  Objective:  Vital signs in last 24 hours:  Blood pressure 190/76, pulse 75, temperature 98.7 F (37.1 C), temperature source Oral, height 5\' 10"  (1.778 m), weight 147 lb 14.4 oz (67.087 kg).    HEENT: No thrush or ulcers Resp: Lungs clear bilaterally Cardio: Regular rate and rhythm GI: Nontender, no hepatosplenomegaly Vascular: Trace file a low pretibial and left lower arm/hand edema Skin: Scattered ecchymoses over the arms, resolving ecchymosis at the left low abdominal wall     Lab Results:  Lab Results  Component Value Date   WBC 2.9* 12/21/2011   HGB 7.3* 12/21/2011   HCT 21.4* 12/21/2011   MCV 93.1 12/21/2011   PLT 167 12/21/2011   ANC 1.7  Calcium 9.6 on 12/16/2011 Creatinine 2.01 on 12/16/2011  Medications: I have reviewed the patient's current medications.  Assessment/Plan:  1. Large B-cell lymphoma involving the bone marrow-status post cycle 1 of CVP/rituximab on 12/13/2011  2. Hypercalcemia of malignancy-resolved following intravenous hydration, and Midrin 8, and chemotherapy  3. Anemia secondary to non-Hodgkin's lymphoma and chemotherapy-not symptomatic today.  4. Renal failure secondary to hypercalcemia and dehydration-improved on a chemistry panel may 24th 2013  5. History of atrial ablation  6. COPD   Disposition:  He is now day 9 following a first cycle of CVP-rituximab therapy. His performance status is much improved. We will followup on a chemistry panel from today.  The anemia is related to non-Hodgkin's lymphoma involving the bone marrow and chemotherapy.  He is not symptomatic from the anemia today. He will return for a CBC and transfusion support as indicated on 12/26/2011.  Mr. Gwinner is scheduled for a second cycle of CVP-rituximab on 01/05/2012. We discussed the indication for adding mitoxantrone or etoposide with cycle 2. He is most comfortable continuing with the CVP-rituximab for now.   Thornton Papas, MD  12/21/2011  5:52 PM

## 2011-12-21 NOTE — Telephone Encounter (Signed)
gave patient appointment  for 12-26-2011 lab only 01-05-2012 start at 8:45am printed out calendar and gave to the patient email michelle about addding treatment for 01-05-2012

## 2011-12-21 NOTE — Telephone Encounter (Signed)
Per staff message from Agar, I have scheduled treatments. JMW

## 2011-12-22 ENCOUNTER — Encounter: Payer: Self-pay | Admitting: Nutrition

## 2011-12-22 NOTE — Progress Notes (Signed)
Telephoned RN to check on patient's nutrition appointment.  RN informed RD that patient wanted to cancel nutrition appointment for today.

## 2011-12-23 ENCOUNTER — Telehealth: Payer: Self-pay | Admitting: *Deleted

## 2011-12-23 DIAGNOSIS — K5900 Constipation, unspecified: Secondary | ICD-10-CM

## 2011-12-23 MED ORDER — MAGNESIUM CITRATE PO SOLN: 148.0000 mL | Freq: Once | ORAL | Status: DC

## 2011-12-23 MED ORDER — SENNOSIDES-DOCUSATE SODIUM 8.6-50 MG PO TABS: 1.0000 | ORAL_TABLET | Freq: Two times a day (BID) | ORAL | Status: DC | PRN

## 2011-12-23 NOTE — Telephone Encounter (Signed)
Taking MiraLax daily with no relief. Instructed patient to drink 1/2 bottle magnesium citrate tonight and repeat in am if no BM. Also start Senna-S taking 1-2 twice daily. Continue MiraLax. Suggested he get on aggressive bowel regimen of Senna and MiraLax few days prior to each chemo treatment and continue for several days afterward since this chemo regimen tends to constipate. He understands and agrees. Also encouraged him to push po fluids and stay active.

## 2011-12-26 ENCOUNTER — Other Ambulatory Visit: Payer: Self-pay | Admitting: *Deleted

## 2011-12-26 ENCOUNTER — Telehealth: Payer: Self-pay | Admitting: *Deleted

## 2011-12-26 ENCOUNTER — Other Ambulatory Visit (HOSPITAL_BASED_OUTPATIENT_CLINIC_OR_DEPARTMENT_OTHER): Payer: Medicare Other | Admitting: Lab

## 2011-12-26 DIAGNOSIS — C8510 Unspecified B-cell lymphoma, unspecified site: Secondary | ICD-10-CM

## 2011-12-26 DIAGNOSIS — C8589 Other specified types of non-Hodgkin lymphoma, extranodal and solid organ sites: Secondary | ICD-10-CM

## 2011-12-26 LAB — CBC WITH DIFFERENTIAL/PLATELET
BASO%: 2.2 % — ABNORMAL HIGH (ref 0.0–2.0)
Basophils Absolute: 0 10*3/uL (ref 0.0–0.1)
EOS%: 7 % (ref 0.0–7.0)
HCT: 22.2 % — ABNORMAL LOW (ref 38.4–49.9)
HGB: 7.5 g/dL — ABNORMAL LOW (ref 13.0–17.1)
LYMPH%: 28.6 % (ref 14.0–49.0)
MCH: 32.2 pg (ref 27.2–33.4)
MCHC: 33.9 g/dL (ref 32.0–36.0)
NEUT%: 42.6 % (ref 39.0–75.0)
Platelets: 147 10*3/uL (ref 140–400)
lymph#: 0.3 10*3/uL — ABNORMAL LOW (ref 0.9–3.3)

## 2011-12-26 NOTE — Telephone Encounter (Signed)
Made patient aware of his CBC results and per MD to call for any fever or sign of infection. Will recheck labs on 6/7. Patient requests T & S again on Friday--will want blood if he is not feeling stronger.

## 2011-12-30 ENCOUNTER — Telehealth: Payer: Self-pay | Admitting: *Deleted

## 2011-12-30 ENCOUNTER — Other Ambulatory Visit (HOSPITAL_BASED_OUTPATIENT_CLINIC_OR_DEPARTMENT_OTHER): Payer: Medicare Other

## 2011-12-30 DIAGNOSIS — C8510 Unspecified B-cell lymphoma, unspecified site: Secondary | ICD-10-CM

## 2011-12-30 DIAGNOSIS — C8589 Other specified types of non-Hodgkin lymphoma, extranodal and solid organ sites: Secondary | ICD-10-CM

## 2011-12-30 LAB — CBC WITH DIFFERENTIAL/PLATELET
Eosinophils Absolute: 0.1 10*3/uL (ref 0.0–0.5)
HCT: 23.1 % — ABNORMAL LOW (ref 38.4–49.9)
LYMPH%: 42.3 % (ref 14.0–49.0)
MCV: 95.7 fL (ref 79.3–98.0)
MONO#: 0.3 10*3/uL (ref 0.1–0.9)
MONO%: 21.1 % — ABNORMAL HIGH (ref 0.0–14.0)
NEUT#: 0.4 10*3/uL — CL (ref 1.5–6.5)
NEUT%: 28.1 % — ABNORMAL LOW (ref 39.0–75.0)
Platelets: 178 10*3/uL (ref 140–400)
WBC: 1.3 10*3/uL — ABNORMAL LOW (ref 4.0–10.3)

## 2011-12-30 NOTE — Telephone Encounter (Signed)
Reviewed cycle 1/day 18 labs--still neutropenic. Denies fever or s/s of infection. Will call for any infection or fever. Hgb 7.9, but he declines need for transfusion. Will follow up on 6/13 as scheduled.

## 2012-01-05 ENCOUNTER — Ambulatory Visit (HOSPITAL_BASED_OUTPATIENT_CLINIC_OR_DEPARTMENT_OTHER): Payer: Medicare Other | Admitting: Oncology

## 2012-01-05 ENCOUNTER — Ambulatory Visit: Payer: Medicare Other | Admitting: Physician Assistant

## 2012-01-05 ENCOUNTER — Telehealth: Payer: Self-pay | Admitting: Oncology

## 2012-01-05 ENCOUNTER — Other Ambulatory Visit (HOSPITAL_BASED_OUTPATIENT_CLINIC_OR_DEPARTMENT_OTHER): Payer: Medicare Other | Admitting: Lab

## 2012-01-05 VITALS — BP 160/59 | HR 52 | Temp 98.7°F | Ht 70.0 in | Wt 141.9 lb

## 2012-01-05 DIAGNOSIS — C8589 Other specified types of non-Hodgkin lymphoma, extranodal and solid organ sites: Secondary | ICD-10-CM

## 2012-01-05 DIAGNOSIS — D702 Other drug-induced agranulocytosis: Secondary | ICD-10-CM

## 2012-01-05 DIAGNOSIS — D638 Anemia in other chronic diseases classified elsewhere: Secondary | ICD-10-CM

## 2012-01-05 DIAGNOSIS — G620 Drug-induced polyneuropathy: Secondary | ICD-10-CM

## 2012-01-05 DIAGNOSIS — C8510 Unspecified B-cell lymphoma, unspecified site: Secondary | ICD-10-CM

## 2012-01-05 LAB — CBC WITH DIFFERENTIAL/PLATELET
BASO%: 1.6 % (ref 0.0–2.0)
Basophils Absolute: 0 10*3/uL (ref 0.0–0.1)
EOS%: 5.4 % (ref 0.0–7.0)
HCT: 24.3 % — ABNORMAL LOW (ref 38.4–49.9)
HGB: 8.1 g/dL — ABNORMAL LOW (ref 13.0–17.1)
LYMPH%: 20.9 % (ref 14.0–49.0)
MCH: 32.7 pg (ref 27.2–33.4)
MCHC: 33.3 g/dL (ref 32.0–36.0)
MCV: 98.2 fL — ABNORMAL HIGH (ref 79.3–98.0)
MONO%: 15.5 % — ABNORMAL HIGH (ref 0.0–14.0)
NEUT%: 56.6 % (ref 39.0–75.0)
Platelets: 162 10*3/uL (ref 140–400)

## 2012-01-05 LAB — COMPREHENSIVE METABOLIC PANEL
Albumin: 3.8 g/dL (ref 3.5–5.2)
Alkaline Phosphatase: 74 U/L (ref 39–117)
BUN: 20 mg/dL (ref 6–23)
Creatinine, Ser: 1.37 mg/dL — ABNORMAL HIGH (ref 0.50–1.35)
Glucose, Bld: 83 mg/dL (ref 70–99)
Potassium: 4.2 mEq/L (ref 3.5–5.3)
Total Bilirubin: 0.4 mg/dL (ref 0.3–1.2)

## 2012-01-05 LAB — HOLD TUBE, BLOOD BANK

## 2012-01-05 MED ORDER — PROMETHAZINE HCL 12.5 MG PO TABS: 12.5000 mg | ORAL_TABLET | Freq: Four times a day (QID) | ORAL | Status: DC | PRN

## 2012-01-05 NOTE — Progress Notes (Signed)
   [facility]    OFFICE PROGRESS NOTE   INTERVAL HISTORY:   He returns as scheduled. He reports an improved energy level compared to pre-chemotherapy. He has developed numbness in the lower legs and feet. He initially noted numbness in the low legs. This has resolved. He now has numbness in the distal feet/toes. This does not interfere with activity.   Objective:  Vital signs in last 24 hours:  Blood pressure 160/59, pulse 52, temperature 98.7 F (37.1 C), temperature source Oral, height 5\' 10"  (1.778 m), weight 141 lb 14.4 oz (64.365 kg).    HEENT: No thrush or ulcers Resp: Lungs clear bilaterally Cardio: Regular rate and rhythm GI: No hepatosplenomegaly Vascular: No leg edema Neuro: Mild decrease in light touch and pinprick at several of his toes, others have normal sensation. Normal sensation at the low legs.     Lab Results:  Lab Results  Component Value Date   WBC 2.1* 01/05/2012   HGB 8.1* 01/05/2012   HCT 24.3* 01/05/2012   MCV 98.2* 01/05/2012   PLT 162 01/05/2012   ANC 1.2 (0.4 on 12/30/2011)  Creatinine 1.37, BUN 20, potassium 4.2   Medications: I have reviewed the patient's current medications.  Assessment/Plan: 1.Large B-cell lymphoma involving the bone marrow-status post cycle 1 of CVP/rituximab on 12/13/2011  2. Hypercalcemia of malignancy-resolved following intravenous hydration, pamidronate, and chemotherapy  3. Anemia secondary to non-Hodgkin's lymphoma and chemotherapy-stable 4. Renal failure secondary to hypercalcemia and dehydration-improved 5. History of atrial fibrillation 6. COPD 7. Severe neutropenia following cycle 1 of CVP/rituximab-improved 8. 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   Disposition:  His overall performance status has improved following the first cycle of chemotherapy. However he continues to have malaise and there is mild remaining  neutropenia. We decided to delay the second cycle of chemotherapy until next week. He will be scheduled for CVP-rituximab on 01/12/2012. [name] will receive Neulasta support with this cycle.  He will return for an office visit and nadir CBC on 01/25/2012.  We will consider adding Novantrone or etoposide with cycle 3. If there is continued improvement in the anemia and his performance status we will likely forego these agents.   Thornton Papas, MD  01/05/2012  6:27 PM

## 2012-01-05 NOTE — Telephone Encounter (Signed)
APPTS MADE AND PRINTED FOR PT AOM °

## 2012-01-06 ENCOUNTER — Other Ambulatory Visit: Payer: Self-pay | Admitting: *Deleted

## 2012-01-06 ENCOUNTER — Ambulatory Visit: Payer: Medicare Other

## 2012-01-06 DIAGNOSIS — C8510 Unspecified B-cell lymphoma, unspecified site: Secondary | ICD-10-CM

## 2012-01-06 MED ORDER — PREDNISONE 20 MG PO TABS: 80.0000 mg | ORAL_TABLET | Freq: Every day | ORAL | Status: DC

## 2012-01-10 ENCOUNTER — Encounter: Payer: Self-pay | Admitting: Oncology

## 2012-01-13 ENCOUNTER — Other Ambulatory Visit (HOSPITAL_BASED_OUTPATIENT_CLINIC_OR_DEPARTMENT_OTHER): Payer: Medicare Other | Admitting: Lab

## 2012-01-13 ENCOUNTER — Ambulatory Visit: Payer: Medicare Other | Admitting: Nutrition

## 2012-01-13 ENCOUNTER — Telehealth: Payer: Self-pay | Admitting: *Deleted

## 2012-01-13 ENCOUNTER — Other Ambulatory Visit: Payer: Self-pay | Admitting: Oncology

## 2012-01-13 ENCOUNTER — Ambulatory Visit (HOSPITAL_BASED_OUTPATIENT_CLINIC_OR_DEPARTMENT_OTHER): Payer: Medicare Other

## 2012-01-13 VITALS — BP 155/60 | HR 51 | Temp 97.1°F

## 2012-01-13 DIAGNOSIS — C8589 Other specified types of non-Hodgkin lymphoma, extranodal and solid organ sites: Secondary | ICD-10-CM

## 2012-01-13 DIAGNOSIS — C8510 Unspecified B-cell lymphoma, unspecified site: Secondary | ICD-10-CM

## 2012-01-13 DIAGNOSIS — Z5111 Encounter for antineoplastic chemotherapy: Secondary | ICD-10-CM

## 2012-01-13 LAB — CBC WITH DIFFERENTIAL/PLATELET
BASO%: 1.8 % (ref 0.0–2.0)
Basophils Absolute: 0.1 10*3/uL (ref 0.0–0.1)
EOS%: 9.1 % — ABNORMAL HIGH (ref 0.0–7.0)
HGB: 9.5 g/dL — ABNORMAL LOW (ref 13.0–17.1)
MCH: 32.3 pg (ref 27.2–33.4)
MCHC: 33.5 g/dL (ref 32.0–36.0)
RDW: 18.4 % — ABNORMAL HIGH (ref 11.0–14.6)
WBC: 2.8 10*3/uL — ABNORMAL LOW (ref 4.0–10.3)
lymph#: 0.7 10*3/uL — ABNORMAL LOW (ref 0.9–3.3)

## 2012-01-13 LAB — COMPREHENSIVE METABOLIC PANEL
AST: 11 U/L (ref 0–37)
Albumin: 4.1 g/dL (ref 3.5–5.2)
BUN: 17 mg/dL (ref 6–23)
CO2: 26 mEq/L (ref 19–32)
Calcium: 9.4 mg/dL (ref 8.4–10.5)
Chloride: 104 mEq/L (ref 96–112)
Potassium: 4 mEq/L (ref 3.5–5.3)

## 2012-01-13 MED ORDER — DIPHENHYDRAMINE HCL 25 MG PO CAPS
50.0000 mg | ORAL_CAPSULE | Freq: Once | ORAL | Status: AC
  Administered 2012-01-13: 50 mg via ORAL

## 2012-01-13 MED ORDER — SODIUM CHLORIDE 0.9 % IV SOLN
375.0000 mg/m2 | Freq: Once | INTRAVENOUS | Status: AC
  Administered 2012-01-13: 700 mg via INTRAVENOUS
  Filled 2012-01-13: qty 70

## 2012-01-13 MED ORDER — ACETAMINOPHEN 325 MG PO TABS
650.0000 mg | ORAL_TABLET | Freq: Once | ORAL | Status: AC
  Administered 2012-01-13: 650 mg via ORAL

## 2012-01-13 MED ORDER — VINCRISTINE SULFATE CHEMO INJECTION 1 MG/ML
1.0000 mg | Freq: Once | INTRAVENOUS | Status: AC
  Administered 2012-01-13: 1 mg via INTRAVENOUS
  Filled 2012-01-13: qty 1

## 2012-01-13 MED ORDER — SODIUM CHLORIDE 0.9 % IV SOLN
600.0000 mg/m2 | Freq: Once | INTRAVENOUS | Status: AC
  Administered 2012-01-13: 1080 mg via INTRAVENOUS
  Filled 2012-01-13: qty 54

## 2012-01-13 MED ORDER — SODIUM CHLORIDE 0.9 % IV SOLN
Freq: Once | INTRAVENOUS | Status: AC
  Administered 2012-01-13: 09:00:00 via INTRAVENOUS

## 2012-01-13 MED ORDER — ONDANSETRON 16 MG/50ML IVPB (CHCC)
16.0000 mg | Freq: Once | INTRAVENOUS | Status: AC
  Administered 2012-01-13: 16 mg via INTRAVENOUS

## 2012-01-13 MED ORDER — PREDNISONE 20 MG PO TABS: 80.0000 mg | ORAL_TABLET | Freq: Every day | ORAL | Status: DC

## 2012-01-13 NOTE — Progress Notes (Signed)
Notified MD Sherill lab values.  OK to treat. Pt discharged to home with daughter.  Tolerated treatment well.  Provided information on chemo given.  No complaints.

## 2012-01-13 NOTE — Patient Instructions (Signed)
Rituximab injection What is this medicine? RITUXIMAB (ri TUX i mab) is a monoclonal antibody. This medicine changes the way the body's immune system works. It is used commonly to treat non-Hodgkin's lymphoma and other conditions. In cancer cells, this drug targets a specific protein within cancer cells and stops the cancer cells from growing. It is also used to treat rhuematoid arthritis (RA). In RA, this medicine slow the inflammatory process and help reduce joint pain and swelling. This medicine is often used with other cancer or arthritis medications. This medicine may be used for other purposes; ask your health care provider or pharmacist if you have questions. What should I tell my health care provider before I take this medicine? They need to know if you have any of these conditions: -blood disorders -heart disease -history of hepatitis B -infection (especially a virus infection such as chickenpox, cold sores, or herpes) -irregular heartbeat -kidney disease -lung or breathing disease, like asthma -lupus -an unusual or allergic reaction to rituximab, mouse proteins, other medicines, foods, dyes, or preservatives -pregnant or trying to get pregnant -breast-feeding How should I use this medicine? This medicine is for infusion into a vein. It is administered in a hospital or clinic by a specially trained health care professional. A special MedGuide will be given to you by the pharmacist with each prescription and refill. Be sure to read this information carefully each time. Talk to your pediatrician regarding the use of this medicine in children. This medicine is not approved for use in children. Overdosage: If you think you have taken too much of this medicine contact a poison control center or emergency room at once. NOTE: This medicine is only for you. Do not share this medicine with others. What if I miss a dose? It is important not to miss a dose. Call your doctor or health care  professional if you are unable to keep an appointment. What may interact with this medicine? -cisplatin -medicines for blood pressure -some other medicines for arthritis -vaccines This list may not describe all possible interactions. Give your health care provider a list of all the medicines, herbs, non-prescription drugs, or dietary supplements you use. Also tell them if you smoke, drink alcohol, or use illegal drugs. Some items may interact with your medicine. What should I watch for while using this medicine? Report any side effects that you notice during your treatment right away, such as changes in your breathing, fever, chills, dizziness or lightheadedness. These effects are more common with the first dose. Visit your prescriber or health care professional for checks on your progress. You will need to have regular blood work. Report any other side effects. The side effects of this medicine can continue after you finish your treatment. Continue your course of treatment even though you feel ill unless your doctor tells you to stop. Call your doctor or health care professional for advice if you get a fever, chills or sore throat, or other symptoms of a cold or flu. Do not treat yourself. This drug decreases your body's ability to fight infections. Try to avoid being around people who are sick. This medicine may increase your risk to bruise or bleed. Call your doctor or health care professional if you notice any unusual bleeding. Be careful brushing and flossing your teeth or using a toothpick because you may get an infection or bleed more easily. If you have any dental work done, tell your dentist you are receiving this medicine. Avoid taking products that contain aspirin, acetaminophen,   ibuprofen, naproxen, or ketoprofen unless instructed by your doctor. These medicines may hide a fever. Do not become pregnant while taking this medicine. Women should inform their doctor if they wish to become  pregnant or think they might be pregnant. There is a potential for serious side effects to an unborn child. Talk to your health care professional or pharmacist for more information. Do not breast-feed an infant while taking this medicine. What side effects may I notice from receiving this medicine? Side effects that you should report to your doctor or health care professional as soon as possible: -allergic reactions like skin rash, itching or hives, swelling of the face, lips, or tongue -low blood counts - this medicine may decrease the number of white blood cells, red blood cells and platelets. You may be at increased risk for infections and bleeding. -signs of infection - fever or chills, cough, sore throat, pain or difficulty passing urine -signs of decreased platelets or bleeding - bruising, pinpoint red spots on the skin, black, tarry stools, blood in the urine -signs of decreased red blood cells - unusually weak or tired, fainting spells, lightheadedness -breathing problems -confused, not responsive -chest pain -fast, irregular heartbeat -feeling faint or lightheaded, falls -mouth sores -redness, blistering, peeling or loosening of the skin, including inside the mouth -stomach pain -swelling of the ankles, feet, or hands -trouble passing urine or change in the amount of urine Side effects that usually do not require medical attention (report to your doctor or other health care professional if they continue or are bothersome): -anxiety -headache -loss of appetite -muscle aches -nausea -night sweats This list may not describe all possible side effects. Call your doctor for medical advice about side effects. You may report side effects to FDA at 1-800-FDA-1088. Where should I keep my medicine? This drug is given in a hospital or clinic and will not be stored at home. NOTE: This sheet is a summary. It may not cover all possible information. If you have questions about this medicine,  talk to your doctor, pharmacist, or health care provider.  2012, Elsevier/Gold Standard. (03/10/2008 2:04:59 PM)  Vincristine injection What is this medicine? VINCRISTINE (vin KRIS teen) is a chemotherapy drug. It slows the growth of cancer cells. This medicine is used to treat many types of cancer like Hodgkin's disease, leukemia, non-Hodgkin's lymphoma, neuroblastoma (brain cancer), rhabdomyosarcoma, and Wilms' tumor. This medicine may be used for other purposes; ask your health care provider or pharmacist if you have questions. What should I tell my health care provider before I take this medicine? They need to know if you have any of these conditions: -blood disorders -gout -infection (especially chickenpox, cold sores, or herpes) -kidney disease -liver disease -lung disease -nervous system disease like Charcot-Marie-Tooth (CMT) -recent or ongoing radiation therapy -an unusual or allergic reaction to vincristine, other chemotherapy agents, other medicines, foods, dyes, or preservatives -pregnant or trying to get pregnant -breast-feeding How should I use this medicine? This drug is given as an infusion into a vein. It is administered in a hospital or clinic by a specially trained health care professional. If you have pain, swelling, burning, or any unusual feeling around the site of your injection, tell your health care professional right away. Talk to your pediatrician regarding the use of this medicine in children. While this drug may be prescribed for selected conditions, precautions do apply. Overdosage: If you think you have taken too much of this medicine contact a poison control center or emergency room at once.  NOTE: This medicine is only for you. Do not share this medicine with others. What if I miss a dose? It is important not to miss your dose. Call your doctor or health care professional if you are unable to keep an appointment. What may interact with this medicine? Do not  take this medicine with any of the following medications: -itraconazole -mibefradil -voriconazole This medicine may also interact with the following medications: -cyclosporine -erythromycin -fluconazole -ketoconazole -medicines for HIV like delavirdine, efavirenz, nevirapine -medicines for seizures like ethotoin, fosphenotoin, phenytoin -medicines to increase blood counts like filgrastim, pegfilgrastim, sargramostim -other chemotherapy drugs like cisplatin, L-asparaginase, methotrexate, mitomycin, paclitaxel -pegaspargase -vaccines -zalcitabine, ddC Talk to your doctor or health care professional before taking any of these medicines: -acetaminophen -aspirin -ibuprofen -ketoprofen -naproxen This list may not describe all possible interactions. Give your health care provider a list of all the medicines, herbs, non-prescription drugs, or dietary supplements you use. Also tell them if you smoke, drink alcohol, or use illegal drugs. Some items may interact with your medicine. What should I watch for while using this medicine? Your condition will be monitored carefully while you are receiving this medicine. You will need important blood work done while you are taking this medicine. This drug may make you feel generally unwell. This is not uncommon, as chemotherapy can affect healthy cells as well as cancer cells. Report any side effects. Continue your course of treatment even though you feel ill unless your doctor tells you to stop. In some cases, you may be given additional medicines to help with side effects. Follow all directions for their use. Call your doctor or health care professional for advice if you get a fever, chills or sore throat, or other symptoms of a cold or flu. Do not treat yourself. Avoid taking products that contain aspirin, acetaminophen, ibuprofen, naproxen, or ketoprofen unless instructed by your doctor. These medicines may hide a fever. Do not become pregnant while  taking this medicine. Women should inform their doctor if they wish to become pregnant or think they might be pregnant. There is a potential for serious side effects to an unborn child. Talk to your health care professional or pharmacist for more information. Do not breast-feed an infant while taking this medicine. Men may have a lower sperm count while taking this medicine. Talk to your doctor if you plan to father a child. What side effects may I notice from receiving this medicine? Side effects that you should report to your doctor or health care professional as soon as possible: -allergic reactions like skin rash, itching or hives, swelling of the face, lips, or tongue -breathing problems -confusion or changes in emotions or moods -constipation -cough -mouth sores -muscle weakness -nausea and vomiting -pain, swelling, redness or irritation at the injection site -pain, tingling, numbness in the hands or feet -problems with balance, talking, walking -seizures -stomach pain -trouble passing urine or change in the amount of urine Side effects that usually do not require medical attention (report to your doctor or health care professional if they continue or are bothersome): -diarrhea -hair loss -jaw pain -loss of appetite This list may not describe all possible side effects. Call your doctor for medical advice about side effects. You may report side effects to FDA at 1-800-FDA-1088. Where should I keep my medicine? This drug is given in a hospital or clinic and will not be stored at home. NOTE: This sheet is a summary. It may not cover all possible information. If you have questions  about this medicine, talk to your doctor, pharmacist, or health care provider.  2012, Elsevier/Gold Standard. (04/07/2008 5:17:13 PM)   Cyclophosphamide injection What is this medicine? CYCLOPHOSPHAMIDE (sye kloe FOSS fa mide) is a chemotherapy drug. It slows the growth of cancer cells. This medicine is  used to treat many types of cancer like lymphoma, myeloma, leukemia, breast cancer, and ovarian cancer, to name a few. It is also used to treat nephrotic syndrome in children. This medicine may be used for other purposes; ask your health care provider or pharmacist if you have questions. What should I tell my health care provider before I take this medicine? They need to know if you have any of these conditions: -blood disorders -history of other chemotherapy -history of radiation therapy -infection -kidney disease -liver disease -tumors in the bone marrow -an unusual or allergic reaction to cyclophosphamide, other chemotherapy, other medicines, foods, dyes, or preservatives -pregnant or trying to get pregnant -breast-feeding How should I use this medicine? This drug is usually given as an injection into a vein or muscle or by infusion into a vein. It is administered in a hospital or clinic by a specially trained health care professional. Talk to your pediatrician regarding the use of this medicine in children. While this drug may be prescribed for selected conditions, precautions do apply. Overdosage: If you think you have taken too much of this medicine contact a poison control center or emergency room at once. NOTE: This medicine is only for you. Do not share this medicine with others. What if I miss a dose? It is important not to miss your dose. Call your doctor or health care professional if you are unable to keep an appointment. What may interact with this medicine? Do not take this medicine with any of the following medications: -mibefradil -nalidixic acid This medicine may also interact with the following medications: -doxorubicin -etanercept -medicines to increase blood counts like filgrastim, pegfilgrastim, sargramostim -medicines that block muscle or nerve pain -St. John's Wort -phenobarbital -succinylcholine chloride -trastuzumab -vaccines Talk to your doctor or health  care professional before taking any of these medicines: -acetaminophen -aspirin -ibuprofen -ketoprofen -naproxen This list may not describe all possible interactions. Give your health care provider a list of all the medicines, herbs, non-prescription drugs, or dietary supplements you use. Also tell them if you smoke, drink alcohol, or use illegal drugs. Some items may interact with your medicine. What should I watch for while using this medicine? Visit your doctor for checks on your progress. This drug may make you feel generally unwell. This is not uncommon, as chemotherapy can affect healthy cells as well as cancer cells. Report any side effects. Continue your course of treatment even though you feel ill unless your doctor tells you to stop. Drink water or other fluids as directed. Urinate often, even at night. In some cases, you may be given additional medicines to help with side effects. Follow all directions for their use. Call your doctor or health care professional for advice if you get a fever, chills or sore throat, or other symptoms of a cold or flu. Do not treat yourself. This drug decreases your body's ability to fight infections. Try to avoid being around people who are sick. This medicine may increase your risk to bruise or bleed. Call your doctor or health care professional if you notice any unusual bleeding. Be careful brushing and flossing your teeth or using a toothpick because you may get an infection or bleed more easily. If you  have any dental work done, tell your dentist you are receiving this medicine. Avoid taking products that contain aspirin, acetaminophen, ibuprofen, naproxen, or ketoprofen unless instructed by your doctor. These medicines may hide a fever. Do not become pregnant while taking this medicine. Women should inform their doctor if they wish to become pregnant or think they might be pregnant. There is a potential for serious side effects to an unborn child. Talk  to your health care professional or pharmacist for more information. Do not breast-feed an infant while taking this medicine. Men should inform their doctor if they wish to father a child. This medicine may lower sperm counts. If you are going to have surgery, tell your doctor or health care professional that you have taken this medicine. What side effects may I notice from receiving this medicine? Side effects that you should report to your doctor or health care professional as soon as possible: -allergic reactions like skin rash, itching or hives, swelling of the face, lips, or tongue -low blood counts - this medicine may decrease the number of white blood cells, red blood cells and platelets. You may be at increased risk for infections and bleeding. -signs of infection - fever or chills, cough, sore throat, pain or difficulty passing urine -signs of decreased platelets or bleeding - bruising, pinpoint red spots on the skin, black, tarry stools, blood in the urine -signs of decreased red blood cells - unusually weak or tired, fainting spells, lightheadedness -breathing problems -dark urine -mouth sores -pain, swelling, redness at site where injected -swelling of the ankles, feet, hands -trouble passing urine or change in the amount of urine -weight gain -yellowing of the eyes or skin Side effects that usually do not require medical attention (report to your doctor or health care professional if they continue or are bothersome): -changes in nail or skin color -diarrhea -hair loss -loss of appetite -missed menstrual periods -nausea, vomiting -stomach pain This list may not describe all possible side effects. Call your doctor for medical advice about side effects. You may report side effects to FDA at 1-800-FDA-1088. Where should I keep my medicine? This drug is given in a hospital or clinic and will not be stored at home. NOTE: This sheet is a summary. It may not cover all possible  information. If you have questions about this medicine, talk to your doctor, pharmacist, or health care provider.  2012, Elsevier/Gold Standard. (10/16/2007 2:32:25 PM)

## 2012-01-13 NOTE — Telephone Encounter (Signed)
Pt called & left message for clarification on his prednisone.  He states that he didn't have any prednisone & had his chemo tx today & the nurses asked him if he was taking it.  Returned call to pt & instructed to take prednisone 80 mg daily x 5 day with each treatment & he should call his pharmacy for refill.  He expressed understanding.

## 2012-01-13 NOTE — Assessment & Plan Note (Signed)
[name] is an 78 year old male patient of Dr. Truett Perna diagnosed with large B-cell lymphoma.  MEDICAL HISTORY INCLUDES:  Anemia, renal failure, atrial fibrillation and COPD.  MEDICATIONS INCLUDE:  Magic mouthwash, omega 3 fatty acids, MiraLAX, Phenergan, and senna.  LABS:  Creatinine of 1.37 on June 13th.  HEIGHT:  5 feet 10 inches. WEIGHT:  141.9 pounds on June 13. USUAL BODY WEIGHT:  155-160 pounds. BMI:  20.36.  The patient reports that he is eating regular meals 3 times a day.  He lives with his daughter who cooks for him.  He states his wife died a few years ago and it is very difficult for him to want to cook much.  He does recognize that he has had weight loss and verbalizes a desire to gain back a few pounds.  He states he has tried [employer] occasionally and likes that product, but does not drink it regularly.  NUTRITION DIAGNOSIS:  Unintended weight loss related to diagnosis of lymphoma and associated treatments as evidenced by an 8% weight loss from usual body weight.  INTERVENTION:  I educated Mr. Trombetta on the importance of smaller more frequent meals with higher calorie, higher protein foods.  I have encouraged him to add Boost Breeze or [employer] Essentials twice a day between meals to increase his overall calorie and protein consumption.  I have encouraged him to use strategies to help the food taste better to him so that he is able to eat more.  I have provided him with fact sheets today and contact information for questions and concerns.  MONITORING/EVALUATION (GOALS):  The patient will tolerate regular meals as well as 2 oral nutrition supplements daily to promote weight stabilization.  NEXT VISIT:  July 12, Friday, during chemotherapy.    ______________________________ Zenovia Jarred, RD, LDN Clinical Nutrition Specialist BN/MEDQ  D:  01/13/2012  T:  01/13/2012  Job:  [phone]

## 2012-01-14 ENCOUNTER — Ambulatory Visit (HOSPITAL_BASED_OUTPATIENT_CLINIC_OR_DEPARTMENT_OTHER): Payer: Medicare Other

## 2012-01-14 ENCOUNTER — Ambulatory Visit: Payer: Medicare Other

## 2012-01-14 VITALS — BP 180/65 | HR 52 | Temp 97.1°F

## 2012-01-14 DIAGNOSIS — Z5189 Encounter for other specified aftercare: Secondary | ICD-10-CM

## 2012-01-14 DIAGNOSIS — C8589 Other specified types of non-Hodgkin lymphoma, extranodal and solid organ sites: Secondary | ICD-10-CM

## 2012-01-14 DIAGNOSIS — C8510 Unspecified B-cell lymphoma, unspecified site: Secondary | ICD-10-CM

## 2012-01-14 MED ORDER — PEGFILGRASTIM INJECTION 6 MG/0.6ML
6.0000 mg | Freq: Once | SUBCUTANEOUS | Status: AC
  Administered 2012-01-14: 6 mg via SUBCUTANEOUS

## 2012-01-24 ENCOUNTER — Ambulatory Visit: Payer: Medicare Other

## 2012-01-25 ENCOUNTER — Ambulatory Visit (HOSPITAL_BASED_OUTPATIENT_CLINIC_OR_DEPARTMENT_OTHER): Payer: Medicare Other

## 2012-01-25 ENCOUNTER — Ambulatory Visit (HOSPITAL_BASED_OUTPATIENT_CLINIC_OR_DEPARTMENT_OTHER): Payer: Medicare Other | Admitting: Oncology

## 2012-01-25 ENCOUNTER — Telehealth: Payer: Self-pay | Admitting: Oncology

## 2012-01-25 VITALS — BP 158/71 | HR 56 | Temp 97.7°F | Ht 70.0 in | Wt 144.1 lb

## 2012-01-25 DIAGNOSIS — C8510 Unspecified B-cell lymphoma, unspecified site: Secondary | ICD-10-CM

## 2012-01-25 DIAGNOSIS — D6481 Anemia due to antineoplastic chemotherapy: Secondary | ICD-10-CM

## 2012-01-25 DIAGNOSIS — C8589 Other specified types of non-Hodgkin lymphoma, extranodal and solid organ sites: Secondary | ICD-10-CM

## 2012-01-25 LAB — CBC WITH DIFFERENTIAL/PLATELET
Basophils Absolute: 0.1 10*3/uL (ref 0.0–0.1)
EOS%: 3.5 % (ref 0.0–7.0)
Eosinophils Absolute: 0.2 10*3/uL (ref 0.0–0.5)
HCT: 27.8 % — ABNORMAL LOW (ref 38.4–49.9)
HGB: 9.3 g/dL — ABNORMAL LOW (ref 13.0–17.1)
MCH: 34 pg — ABNORMAL HIGH (ref 27.2–33.4)
MCV: 102.2 fL — ABNORMAL HIGH (ref 79.3–98.0)
MONO%: 6.3 % (ref 0.0–14.0)
NEUT%: 80.8 % — ABNORMAL HIGH (ref 39.0–75.0)
lymph#: 0.5 10*3/uL — ABNORMAL LOW (ref 0.9–3.3)

## 2012-01-25 NOTE — Progress Notes (Signed)
   [facility]    OFFICE PROGRESS NOTE   INTERVAL HISTORY:   He returns as scheduled. Thomas Doyle completed another cycle of chemotherapy on 01/13/2012. He reports tolerating the chemotherapy well. No mouth sores, nausea, or diarrhea. The numbness over the lower legs and feet has improved. He now has numbness only in the toes. He reports insomnia when taking prednisone. He received Neulasta following cycle 2.  Good appetite and energy level.  Objective:  Vital signs in last 24 hours:  Blood pressure 158/71, pulse 56, temperature 97.7 F (36.5 C), temperature source Oral, height 5\' 10"  (1.778 m), weight 144 lb 1.6 oz (65.363 kg).    HEENT: No thrush or ulcers Resp: Lungs clear bilaterally Cardio: Regular rate and rhythm GI: No hepatosplenomegaly, nontender Vascular: No leg edema   Lab Results:  Lab Results  Component Value Date   WBC 6.3 01/25/2012   HGB 9.3* 01/25/2012   HCT 27.8* 01/25/2012   MCV 102.2* 01/25/2012   PLT 132* 01/25/2012   ANC 5.1 Creatinine 1.41 on 01/13/2012 Calcium 9.4 on 01/13/2012. Albumin 4.1 on 01/13/2012 to  Medications: I have reviewed the patient's current medications.  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 He completed cycle 2 of CVP/rituximab on 01/13/2012.  2. Hypercalcemia of malignancy-resolved following intravenous hydration, pamidronate, and chemotherapy  3. Anemia secondary to non-Hodgkin's lymphoma and chemotherapy-stable  4. Renal failure secondary to hypercalcemia and dehydration-improved  5. History of atrial fibrillation  6. COPD  7. Severe neutropenia following cycle 1 of CVP/rituximab-he received Neulasta with cycle 2 8. 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 The numbness has improved.  Disposition:  Thomas Doyle has completed 2 cycles of CVP/rituximab. His performance  status is markedly improved. The hypercalcemia has resolved. He has tolerated chemotherapy well.  Thomas Doyle will return for cycle 3 of CVP/rituximab on 02/03/2012. He will return for an office visit and nadir CBC on 02/16/2012. We decided against the addition of etoposide or Novantrone given his apparent positive response to the CVP-rituximab.   Thornton Papas, MD  01/25/2012  3:00 PM

## 2012-01-25 NOTE — Telephone Encounter (Signed)
Gave pt appt calendar for July 2013 lab, chemo and MD

## 2012-02-02 ENCOUNTER — Other Ambulatory Visit: Payer: Self-pay | Admitting: Oncology

## 2012-02-03 ENCOUNTER — Ambulatory Visit: Payer: Medicare Other | Admitting: Nutrition

## 2012-02-03 ENCOUNTER — Other Ambulatory Visit (HOSPITAL_BASED_OUTPATIENT_CLINIC_OR_DEPARTMENT_OTHER): Payer: Medicare Other | Admitting: Lab

## 2012-02-03 ENCOUNTER — Ambulatory Visit (HOSPITAL_BASED_OUTPATIENT_CLINIC_OR_DEPARTMENT_OTHER): Payer: Medicare Other

## 2012-02-03 VITALS — BP 127/58 | HR 68 | Temp 97.7°F

## 2012-02-03 DIAGNOSIS — C8510 Unspecified B-cell lymphoma, unspecified site: Secondary | ICD-10-CM

## 2012-02-03 DIAGNOSIS — C8589 Other specified types of non-Hodgkin lymphoma, extranodal and solid organ sites: Secondary | ICD-10-CM

## 2012-02-03 DIAGNOSIS — Z5111 Encounter for antineoplastic chemotherapy: Secondary | ICD-10-CM

## 2012-02-03 DIAGNOSIS — D638 Anemia in other chronic diseases classified elsewhere: Secondary | ICD-10-CM

## 2012-02-03 DIAGNOSIS — Z5112 Encounter for antineoplastic immunotherapy: Secondary | ICD-10-CM

## 2012-02-03 LAB — COMPREHENSIVE METABOLIC PANEL
AST: 11 U/L (ref 0–37)
BUN: 23 mg/dL (ref 6–23)
Calcium: 9.5 mg/dL (ref 8.4–10.5)
Chloride: 103 mEq/L (ref 96–112)
Creatinine, Ser: 1.25 mg/dL (ref 0.50–1.35)
Glucose, Bld: 81 mg/dL (ref 70–99)

## 2012-02-03 LAB — CBC & DIFF AND RETIC
Basophils Absolute: 0.1 10*3/uL (ref 0.0–0.1)
Eosinophils Absolute: 0.2 10*3/uL (ref 0.0–0.5)
HCT: 27.9 % — ABNORMAL LOW (ref 38.4–49.9)
HGB: 9.3 g/dL — ABNORMAL LOW (ref 13.0–17.1)
Immature Retic Fract: 6.2 % (ref 3.00–10.60)
MCH: 32.1 pg (ref 27.2–33.4)
MONO#: 0.5 10*3/uL (ref 0.1–0.9)
NEUT#: 2.9 10*3/uL (ref 1.5–6.5)
NEUT%: 64.9 % (ref 39.0–75.0)
RDW: 16 % — ABNORMAL HIGH (ref 11.0–14.6)
Retic %: 1.83 % — ABNORMAL HIGH (ref 0.80–1.80)
Retic Ct Abs: 53.07 10*3/uL (ref 34.80–93.90)
lymph#: 0.8 10*3/uL — ABNORMAL LOW (ref 0.9–3.3)

## 2012-02-03 MED ORDER — SODIUM CHLORIDE 0.9 % IV SOLN
Freq: Once | INTRAVENOUS | Status: AC
  Administered 2012-02-03: 09:00:00 via INTRAVENOUS

## 2012-02-03 MED ORDER — VINCRISTINE SULFATE CHEMO INJECTION 1 MG/ML
1.0000 mg | Freq: Once | INTRAVENOUS | Status: AC
  Administered 2012-02-03: 1 mg via INTRAVENOUS
  Filled 2012-02-03: qty 1

## 2012-02-03 MED ORDER — ACETAMINOPHEN 325 MG PO TABS
650.0000 mg | ORAL_TABLET | Freq: Once | ORAL | Status: AC
  Administered 2012-02-03: 650 mg via ORAL

## 2012-02-03 MED ORDER — SODIUM CHLORIDE 0.9 % IV SOLN
600.0000 mg/m2 | Freq: Once | INTRAVENOUS | Status: AC
  Administered 2012-02-03: 1080 mg via INTRAVENOUS
  Filled 2012-02-03: qty 54

## 2012-02-03 MED ORDER — ONDANSETRON 16 MG/50ML IVPB (CHCC)
16.0000 mg | Freq: Once | INTRAVENOUS | Status: AC
  Administered 2012-02-03: 16 mg via INTRAVENOUS

## 2012-02-03 MED ORDER — SODIUM CHLORIDE 0.9 % IV SOLN
375.0000 mg/m2 | Freq: Once | INTRAVENOUS | Status: AC
  Administered 2012-02-03: 700 mg via INTRAVENOUS
  Filled 2012-02-03: qty 70

## 2012-02-03 MED ORDER — DIPHENHYDRAMINE HCL 25 MG PO CAPS
50.0000 mg | ORAL_CAPSULE | Freq: Once | ORAL | Status: AC
  Administered 2012-02-03: 50 mg via ORAL

## 2012-02-03 NOTE — Patient Instructions (Addendum)
[facility] Discharge Instructions for Patients Receiving Chemotherapy  Today you received the following chemotherapy agents Vincristine, Cytoxan and Rituxan.  To help prevent nausea and vomiting after your treatment, we encourage you to take your nausea medication. Begin taking it as often as prescribed for by Dr. Truett Perna.    If you develop nausea and vomiting that is not controlled by your nausea medication, call the clinic. If it is after clinic hours your family physician or the after hours number for the clinic or go to the Emergency Department.   BELOW ARE SYMPTOMS THAT SHOULD BE REPORTED IMMEDIATELY:  *FEVER GREATER THAN 100.5 F  *CHILLS WITH OR WITHOUT FEVER  NAUSEA AND VOMITING THAT IS NOT CONTROLLED WITH YOUR NAUSEA MEDICATION  *UNUSUAL SHORTNESS OF BREATH  *UNUSUAL BRUISING OR BLEEDING  TENDERNESS IN MOUTH AND THROAT WITH OR WITHOUT PRESENCE OF ULCERS  *URINARY PROBLEMS  *BOWEL PROBLEMS  UNUSUAL RASH Items with * indicate a potential emergency and should be followed up as soon as possible.  One of the nurses will contact you 24 hours after your treatment. Please let the nurse know about any problems that you may have experienced. Feel free to call the clinic you have any questions or concerns. The clinic phone number is [phone].   I have been informed and understand all the instructions given to me. I know to contact the clinic, my physician, or go to the Emergency Department if any problems should occur. I do not have any questions at this time, but understand that I may call the clinic during office hours   should I have any questions or need assistance in obtaining follow up care.    __________________________________________  _____________  __________ Signature of Patient or Authorized Representative            Date                   Time    __________________________________________ Nurse's Signature

## 2012-02-03 NOTE — Progress Notes (Signed)
Thomas Doyle is doing quite well.  He was pleased that he could purchase [employer] from our outpatient pharmacy at a discounted rate.  He denies nausea and problems with oral intake.  He states he consumes 1 carton of [place] every other day.  His weight has increased to 144.1 pounds from 141.9 pounds June 13th.  NUTRITION DIAGNOSIS:  Unintended weight loss has improved.  INTERVENTION:  I have encouraged Thomas Doyle to continue Boost Breeze daily as needed to promote weight maintenance.  I have encouraged him to continue small frequent meals with high-calorie, high-protein foods. The patient is appreciative of the information.  NEXT VISIT:  There is no followup scheduled.  The patient has my contact information for questions or concerns.    ______________________________ Zenovia Jarred, RD,CSO,LDN Clinical Nutrition Specialist BN/MEDQ  D:  02/03/2012  T:  02/03/2012  Job:  [id number]

## 2012-02-14 ENCOUNTER — Ambulatory Visit: Payer: Medicare Other

## 2012-02-16 ENCOUNTER — Ambulatory Visit (HOSPITAL_BASED_OUTPATIENT_CLINIC_OR_DEPARTMENT_OTHER): Payer: Medicare Other | Admitting: Oncology

## 2012-02-16 ENCOUNTER — Other Ambulatory Visit (HOSPITAL_BASED_OUTPATIENT_CLINIC_OR_DEPARTMENT_OTHER): Payer: Medicare Other | Admitting: Lab

## 2012-02-16 ENCOUNTER — Telehealth: Payer: Self-pay | Admitting: Oncology

## 2012-02-16 VITALS — BP 125/69 | HR 78 | Temp 97.6°F | Ht 70.0 in | Wt 144.7 lb

## 2012-02-16 DIAGNOSIS — C8589 Other specified types of non-Hodgkin lymphoma, extranodal and solid organ sites: Secondary | ICD-10-CM

## 2012-02-16 DIAGNOSIS — C8510 Unspecified B-cell lymphoma, unspecified site: Secondary | ICD-10-CM

## 2012-02-16 LAB — CBC WITH DIFFERENTIAL/PLATELET
BASO%: 3.4 % — ABNORMAL HIGH (ref 0.0–2.0)
HCT: 28.6 % — ABNORMAL LOW (ref 38.4–49.9)
LYMPH%: 20.3 % (ref 14.0–49.0)
MCH: 32.9 pg (ref 27.2–33.4)
MCHC: 33.9 g/dL (ref 32.0–36.0)
MONO#: 0.4 10*3/uL (ref 0.1–0.9)
NEUT%: 43.9 % (ref 39.0–75.0)
Platelets: 184 10*3/uL (ref 140–400)

## 2012-02-16 NOTE — Telephone Encounter (Signed)
Gave pt appt calendar for August 2014 chemo, injection,lab and MD

## 2012-02-16 NOTE — Progress Notes (Signed)
   [facility]    OFFICE PROGRESS NOTE   INTERVAL HISTORY:   He returns as scheduled. He completed another cycle of chemotherapy on 02/03/2012. He received Neulasta on day 2. He tolerated the chemotherapy well. He reports the neuropathy symptoms in the feet are "90%" improved. He has a good appetite.  Objective:  Vital signs in last 24 hours:  Blood pressure 125/69, pulse 78, temperature 97.6 F (36.4 C), temperature source Oral, height 5\' 10"  (1.778 m), weight 144 lb 11.2 oz (65.635 kg).    HEENT: No thrush or ulcers Resp: Lungs clear bilaterally Cardio: Regular rate and rhythm GI: No hepatosplenomegaly, nontender Vascular: No leg edema   Lab Results:  Lab Results  Component Value Date   WBC 2.0* 02/16/2012   HGB 9.7* 02/16/2012   HCT 28.6* 02/16/2012   MCV 97.0 02/16/2012   PLT 184 02/16/2012   ANC 0.9    Medications: I have reviewed the patient's current medications.  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 He completed cycle  of CVP/rituximab on 02/03/2012. 2. Hypercalcemia of malignancy-resolved following intravenous hydration, pamidronate, and chemotherapy  3. Anemia secondary to non-Hodgkin's lymphoma and chemotherapy-stable  4. Renal failure secondary to hypercalcemia and dehydration-improved  5. History of atrial fibrillation  6. COPD  7. Severe neutropenia following cycle 1 of CVP/rituximab-he received Neulasta beginning with cycle 2 8. 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 The numbness has improved.   Disposition:  He appears well. He has completed 3 cycles of CVP/rituximab. Mr. Champine has tolerated the chemotherapy well. He will return for cycle 4 on 02/24/2012. He will again receive Neulasta support. He will return for an office visit and nadir CBC on 03/08/2012.   Thornton Papas,  MD  02/16/2012  2:05 PM

## 2012-02-23 ENCOUNTER — Other Ambulatory Visit: Payer: Self-pay | Admitting: Oncology

## 2012-02-24 ENCOUNTER — Ambulatory Visit: Payer: Medicare Other

## 2012-02-24 ENCOUNTER — Ambulatory Visit (HOSPITAL_BASED_OUTPATIENT_CLINIC_OR_DEPARTMENT_OTHER): Payer: Medicare Other | Admitting: Lab

## 2012-02-24 ENCOUNTER — Other Ambulatory Visit: Payer: Self-pay | Admitting: *Deleted

## 2012-02-24 DIAGNOSIS — C8510 Unspecified B-cell lymphoma, unspecified site: Secondary | ICD-10-CM

## 2012-02-24 DIAGNOSIS — C8589 Other specified types of non-Hodgkin lymphoma, extranodal and solid organ sites: Secondary | ICD-10-CM

## 2012-02-24 LAB — CBC WITH DIFFERENTIAL/PLATELET
BASO%: 2.8 % — ABNORMAL HIGH (ref 0.0–2.0)
EOS%: 13.3 % — ABNORMAL HIGH (ref 0.0–7.0)
HCT: 28.6 % — ABNORMAL LOW (ref 38.4–49.9)
LYMPH%: 29.8 % (ref 14.0–49.0)
MCH: 31.6 pg (ref 27.2–33.4)
MCHC: 33.6 g/dL (ref 32.0–36.0)
MCV: 94.1 fL (ref 79.3–98.0)
MONO%: 19.4 % — ABNORMAL HIGH (ref 0.0–14.0)
NEUT%: 34.7 % — ABNORMAL LOW (ref 39.0–75.0)
lymph#: 0.7 10*3/uL — ABNORMAL LOW (ref 0.9–3.3)

## 2012-02-24 NOTE — Progress Notes (Signed)
0930---Treatment held today x 1 week due to Neutropenia per Dr Truett Perna. WBC=2.5 ANC=0.9

## 2012-02-25 ENCOUNTER — Other Ambulatory Visit: Payer: Self-pay | Admitting: Oncology

## 2012-02-25 ENCOUNTER — Ambulatory Visit: Payer: Medicare Other

## 2012-03-01 ENCOUNTER — Telehealth: Payer: Self-pay | Admitting: Oncology

## 2012-03-01 NOTE — Telephone Encounter (Signed)
called pt and added inj for 8/10 as well as added lab for 8/9    aom

## 2012-03-02 ENCOUNTER — Ambulatory Visit: Payer: Medicare Other

## 2012-03-02 ENCOUNTER — Other Ambulatory Visit: Payer: Self-pay | Admitting: *Deleted

## 2012-03-02 ENCOUNTER — Other Ambulatory Visit (HOSPITAL_BASED_OUTPATIENT_CLINIC_OR_DEPARTMENT_OTHER): Payer: Medicare Other | Admitting: Lab

## 2012-03-02 DIAGNOSIS — C8510 Unspecified B-cell lymphoma, unspecified site: Secondary | ICD-10-CM

## 2012-03-02 DIAGNOSIS — C8589 Other specified types of non-Hodgkin lymphoma, extranodal and solid organ sites: Secondary | ICD-10-CM

## 2012-03-02 LAB — CBC WITH DIFFERENTIAL/PLATELET
BASO%: 3 % — ABNORMAL HIGH (ref 0.0–2.0)
Basophils Absolute: 0.1 10*3/uL (ref 0.0–0.1)
EOS%: 17.7 % — ABNORMAL HIGH (ref 0.0–7.0)
HGB: 9.8 g/dL — ABNORMAL LOW (ref 13.0–17.1)
MCH: 31.3 pg (ref 27.2–33.4)
RDW: 15.3 % — ABNORMAL HIGH (ref 11.0–14.6)
WBC: 2.7 10*3/uL — ABNORMAL LOW (ref 4.0–10.3)
lymph#: 0.8 10*3/uL — ABNORMAL LOW (ref 0.9–3.3)

## 2012-03-02 NOTE — Progress Notes (Signed)
Treatment cancelled today because of labs, per Lonna Cobb, NP; injection appointment cancelled for tomorrow (03/04/11) also; patient aware; POF sent to schedulers to call patient with new appointment for next week.

## 2012-03-03 ENCOUNTER — Ambulatory Visit: Payer: Medicare Other

## 2012-03-05 ENCOUNTER — Telehealth: Payer: Self-pay | Admitting: *Deleted

## 2012-03-05 NOTE — Telephone Encounter (Signed)
Per staff message and POF I have schedueld appts. MW

## 2012-03-07 ENCOUNTER — Telehealth: Payer: Self-pay | Admitting: Oncology

## 2012-03-07 NOTE — Telephone Encounter (Signed)
S/w the pt and he is aware of his aug appts and will pick up a schedule this friday

## 2012-03-08 ENCOUNTER — Ambulatory Visit: Payer: Medicare Other | Admitting: Nurse Practitioner

## 2012-03-08 ENCOUNTER — Other Ambulatory Visit: Payer: Medicare Other | Admitting: Lab

## 2012-03-08 ENCOUNTER — Ambulatory Visit: Payer: Medicare Other | Admitting: Oncology

## 2012-03-09 ENCOUNTER — Ambulatory Visit: Payer: Medicare Other

## 2012-03-09 ENCOUNTER — Other Ambulatory Visit (HOSPITAL_BASED_OUTPATIENT_CLINIC_OR_DEPARTMENT_OTHER): Payer: Medicare Other | Admitting: Lab

## 2012-03-09 ENCOUNTER — Telehealth: Payer: Self-pay | Admitting: Oncology

## 2012-03-09 ENCOUNTER — Telehealth: Payer: Self-pay

## 2012-03-09 ENCOUNTER — Other Ambulatory Visit: Payer: Self-pay | Admitting: *Deleted

## 2012-03-09 DIAGNOSIS — C8510 Unspecified B-cell lymphoma, unspecified site: Secondary | ICD-10-CM

## 2012-03-09 DIAGNOSIS — C8589 Other specified types of non-Hodgkin lymphoma, extranodal and solid organ sites: Secondary | ICD-10-CM

## 2012-03-09 LAB — CBC WITH DIFFERENTIAL/PLATELET
Eosinophils Absolute: 0.4 10*3/uL (ref 0.0–0.5)
MONO#: 0.4 10*3/uL (ref 0.1–0.9)
NEUT#: 0.4 10*3/uL — CL (ref 1.5–6.5)
Platelets: 163 10*3/uL (ref 140–400)
RBC: 3.07 10*6/uL — ABNORMAL LOW (ref 4.20–5.82)
RDW: 15.4 % — ABNORMAL HIGH (ref 11.0–14.6)
WBC: 2 10*3/uL — ABNORMAL LOW (ref 4.0–10.3)

## 2012-03-09 NOTE — Progress Notes (Signed)
Per Dr. Truett Perna, will hold treatment today due to counts.  Pt aware and given copy of labs.  Instructed pt to be cautious on infection prevention.  He verbalized understanding-dhp, rn

## 2012-03-09 NOTE — Telephone Encounter (Signed)
S/w the pt and he is aware of his 03/16/2012 appt and will pick up a calendar at that time

## 2012-03-09 NOTE — Telephone Encounter (Signed)
Per staff message and POF I have scheduled appt. TMB 

## 2012-03-09 NOTE — Telephone Encounter (Signed)
Sent Thomas Doyle the staff message to add the chemo appt

## 2012-03-10 ENCOUNTER — Ambulatory Visit: Payer: Medicare Other

## 2012-03-15 ENCOUNTER — Encounter: Payer: Self-pay | Admitting: Pharmacist

## 2012-03-16 ENCOUNTER — Telehealth: Payer: Self-pay | Admitting: *Deleted

## 2012-03-16 ENCOUNTER — Ambulatory Visit: Payer: Medicare Other | Admitting: Oncology

## 2012-03-16 ENCOUNTER — Other Ambulatory Visit: Payer: Self-pay | Admitting: *Deleted

## 2012-03-16 ENCOUNTER — Ambulatory Visit (HOSPITAL_BASED_OUTPATIENT_CLINIC_OR_DEPARTMENT_OTHER): Payer: Medicare Other

## 2012-03-16 ENCOUNTER — Other Ambulatory Visit: Payer: Medicare Other | Admitting: Lab

## 2012-03-16 ENCOUNTER — Other Ambulatory Visit (HOSPITAL_BASED_OUTPATIENT_CLINIC_OR_DEPARTMENT_OTHER): Payer: Medicare Other | Admitting: Lab

## 2012-03-16 VITALS — BP 155/67 | HR 52 | Temp 96.7°F

## 2012-03-16 DIAGNOSIS — D709 Neutropenia, unspecified: Secondary | ICD-10-CM

## 2012-03-16 DIAGNOSIS — C8589 Other specified types of non-Hodgkin lymphoma, extranodal and solid organ sites: Secondary | ICD-10-CM

## 2012-03-16 DIAGNOSIS — C8510 Unspecified B-cell lymphoma, unspecified site: Secondary | ICD-10-CM

## 2012-03-16 LAB — CBC WITH DIFFERENTIAL/PLATELET
Eosinophils Absolute: 0.4 10*3/uL (ref 0.0–0.5)
HCT: 30.6 % — ABNORMAL LOW (ref 38.4–49.9)
LYMPH%: 41.4 % (ref 14.0–49.0)
MONO#: 0.5 10*3/uL (ref 0.1–0.9)
NEUT#: 0.3 10*3/uL — CL (ref 1.5–6.5)
NEUT%: 13.4 % — ABNORMAL LOW (ref 39.0–75.0)
Platelets: 159 10*3/uL (ref 140–400)
WBC: 2.1 10*3/uL — ABNORMAL LOW (ref 4.0–10.3)

## 2012-03-16 MED ORDER — FILGRASTIM 300 MCG/0.5ML IJ SOLN
300.0000 ug | Freq: Once | INTRAMUSCULAR | Status: AC
  Administered 2012-03-16: 300 ug via SUBCUTANEOUS
  Filled 2012-03-16: qty 0.5

## 2012-03-16 NOTE — Patient Instructions (Addendum)

## 2012-03-16 NOTE — Telephone Encounter (Signed)
Received call from pt requesting Dr. Truett Perna to call in prescription for Altabax for his lesions on face "that medicine has worked well for my face"  Returned call to pt and left voice message on cell, per Dr. Truett Perna, need to call MD who orginally prescribed this medicine to have it re-filled.

## 2012-03-16 NOTE — Progress Notes (Signed)
1030-Hold chemo treatment today d/t CBC results per Dr. Truett Perna.  Order received for pt to receive Neupogen today, tomorrow and Monday.  Pt instructed on Neupogen and neutropenic precautions.  Pt verbalizes an understanding of all information.

## 2012-03-17 ENCOUNTER — Ambulatory Visit (HOSPITAL_BASED_OUTPATIENT_CLINIC_OR_DEPARTMENT_OTHER): Payer: Medicare Other

## 2012-03-17 VITALS — BP 113/57 | HR 74 | Temp 98.6°F | Resp 20

## 2012-03-17 DIAGNOSIS — D709 Neutropenia, unspecified: Secondary | ICD-10-CM

## 2012-03-17 DIAGNOSIS — C8589 Other specified types of non-Hodgkin lymphoma, extranodal and solid organ sites: Secondary | ICD-10-CM

## 2012-03-17 MED ORDER — FILGRASTIM 300 MCG/0.5ML IJ SOLN
300.0000 ug | Freq: Once | INTRAMUSCULAR | Status: AC
  Administered 2012-03-17: 480 ug via SUBCUTANEOUS

## 2012-03-19 ENCOUNTER — Ambulatory Visit (HOSPITAL_BASED_OUTPATIENT_CLINIC_OR_DEPARTMENT_OTHER): Payer: Medicare Other

## 2012-03-19 ENCOUNTER — Other Ambulatory Visit (HOSPITAL_BASED_OUTPATIENT_CLINIC_OR_DEPARTMENT_OTHER): Payer: Medicare Other | Admitting: Lab

## 2012-03-19 VITALS — BP 171/62 | HR 60 | Temp 97.1°F | Resp 18

## 2012-03-19 DIAGNOSIS — D709 Neutropenia, unspecified: Secondary | ICD-10-CM

## 2012-03-19 DIAGNOSIS — C8510 Unspecified B-cell lymphoma, unspecified site: Secondary | ICD-10-CM

## 2012-03-19 LAB — CBC WITH DIFFERENTIAL/PLATELET
BASO%: 1.3 % (ref 0.0–2.0)
Eosinophils Absolute: 0.5 10*3/uL (ref 0.0–0.5)
HCT: 33.6 % — ABNORMAL LOW (ref 38.4–49.9)
MCHC: 33.8 g/dL (ref 32.0–36.0)
MONO#: 1 10*3/uL — ABNORMAL HIGH (ref 0.1–0.9)
NEUT#: 1.6 10*3/uL (ref 1.5–6.5)
NEUT%: 42.4 % (ref 39.0–75.0)
Platelets: 135 10*3/uL — ABNORMAL LOW (ref 140–400)
WBC: 3.9 10*3/uL — ABNORMAL LOW (ref 4.0–10.3)
lymph#: 0.7 10*3/uL — ABNORMAL LOW (ref 0.9–3.3)

## 2012-03-19 MED ORDER — FILGRASTIM 300 MCG/0.5ML IJ SOLN
300.0000 ug | Freq: Once | INTRAMUSCULAR | Status: AC
  Administered 2012-03-19: 300 ug via SUBCUTANEOUS
  Filled 2012-03-19: qty 0.5

## 2012-03-22 ENCOUNTER — Other Ambulatory Visit (HOSPITAL_BASED_OUTPATIENT_CLINIC_OR_DEPARTMENT_OTHER): Payer: Medicare Other | Admitting: Lab

## 2012-03-22 ENCOUNTER — Telehealth: Payer: Self-pay | Admitting: *Deleted

## 2012-03-22 ENCOUNTER — Telehealth: Payer: Self-pay | Admitting: Oncology

## 2012-03-22 ENCOUNTER — Other Ambulatory Visit: Payer: Self-pay | Admitting: *Deleted

## 2012-03-22 ENCOUNTER — Ambulatory Visit (HOSPITAL_BASED_OUTPATIENT_CLINIC_OR_DEPARTMENT_OTHER): Payer: Medicare Other | Admitting: Oncology

## 2012-03-22 VITALS — BP 154/85 | HR 77 | Temp 97.6°F | Resp 20 | Ht 70.0 in | Wt 149.1 lb

## 2012-03-22 DIAGNOSIS — C8589 Other specified types of non-Hodgkin lymphoma, extranodal and solid organ sites: Secondary | ICD-10-CM

## 2012-03-22 DIAGNOSIS — D702 Other drug-induced agranulocytosis: Secondary | ICD-10-CM

## 2012-03-22 DIAGNOSIS — C8510 Unspecified B-cell lymphoma, unspecified site: Secondary | ICD-10-CM

## 2012-03-22 DIAGNOSIS — D649 Anemia, unspecified: Secondary | ICD-10-CM

## 2012-03-22 DIAGNOSIS — I4891 Unspecified atrial fibrillation: Secondary | ICD-10-CM

## 2012-03-22 LAB — CBC WITH DIFFERENTIAL/PLATELET
Basophils Absolute: 0.1 10*3/uL (ref 0.0–0.1)
HCT: 30.6 % — ABNORMAL LOW (ref 38.4–49.9)
HGB: 10.3 g/dL — ABNORMAL LOW (ref 13.0–17.1)
LYMPH%: 26.1 % (ref 14.0–49.0)
MCH: 31.4 pg (ref 27.2–33.4)
MONO#: 0.6 10*3/uL (ref 0.1–0.9)
NEUT%: 43.9 % (ref 39.0–75.0)
Platelets: 129 10*3/uL — ABNORMAL LOW (ref 140–400)
WBC: 3.5 10*3/uL — ABNORMAL LOW (ref 4.0–10.3)
lymph#: 0.9 10*3/uL (ref 0.9–3.3)

## 2012-03-22 MED ORDER — PREDNISONE 20 MG PO TABS: 80.0000 mg | ORAL_TABLET | Freq: Every day | ORAL | Status: DC

## 2012-03-22 NOTE — Telephone Encounter (Signed)
Per POF and staff message I have scheduled appt. JW

## 2012-03-22 NOTE — Telephone Encounter (Signed)
Gave pt appt for August 2013 labs and chemo . Pt also got appt calendar for September 2013 lab, MD and chemo

## 2012-03-22 NOTE — Telephone Encounter (Signed)
Talked to patient and gave him appt for labs tomorrow [phone]

## 2012-03-22 NOTE — Progress Notes (Signed)
   [facility]    OFFICE PROGRESS NOTE   INTERVAL HISTORY:   He complete cycle 3 of CVP-Rituxan on 02/03/2012. He tolerated the chemotherapy well. Chemotherapy has been delayed for the past several weeks secondary to neutropenia. He received a dose of Neupogen on 03/16/2012 and 03/17/2012. The neutrophil count was higher on 03/19/2012.  He has a good appetite and energy level. He has noted intermittent discomfort at the right "elbow "and right upper anterior chest for the past week. He intermittent tingling in the right fingers. No consistent pain. No arm swelling or trauma. No neck pain.   Objective:  Vital signs in last 24 hours:  Blood pressure 154/85, pulse 77, temperature 97.6 F (36.4 C), temperature source Oral, resp. rate 20, height 5\' 10"  (1.778 m), weight 149 lb 1.6 oz (67.631 kg).    HEENT: No thrush or ulcers Lymphatics: No right axillary nodes. Resp: Lungs clear bilaterally Cardio: Regular rate and rhythm GI: Nontender, no hepatosplenomegaly Vascular: No leg or arm edema Neuro: The motor exam appears intact in the upper extremities bilaterally  Musculoskeletal: Full range of motion at the right elbow and right shoulder without pain. Examination of the right arm and elbow is unremarkable.     Lab Results:  Lab Results  Component Value Date   WBC 3.5* 03/22/2012   HGB 10.3* 03/22/2012   HCT 30.6* 03/22/2012   MCV 93.3 03/22/2012   PLT 129* 03/22/2012   ANC 1.6    Medications: I have reviewed the patient's current medications.  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 He completed cycle 3 of CVP/rituximab on 02/03/2012. 2. Hypercalcemia of malignancy-resolved following intravenous hydration, pamidronate, and chemotherapy  3. Anemia secondary to non-Hodgkin's lymphoma and chemotherapy-stable  4. Renal failure secondary to hypercalcemia and dehydration-improved  5.  History of atrial fibrillation  6. COPD  7. Severe neutropenia following cycle 1 of CVP/rituximab-he received Neulasta beginning with cycle 2 , severe neutropenia following cycle 3 (he did not receive Neulasta) 8. 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 The numbness has improved.  9. Right arm, right elbow, right upper chest discomfort and tingling at the right fingers-likely a benign finding. He will contact us for persistent symptoms.   Disposition:  [name] appears well. He has completed 3 cycles of CVP-rituximab. He has tolerated the treatment well a side from neutropenia. Treatment has been delayed for the past several weeks due to neutropenia. The neutrophil count improved with Neupogen and is stable today. The plan is to proceed with cycle 4 of CVP-rituximab on 03/23/2012. He will receive Neulasta support. Mr. Colucci will return for a nadir CBC. He will be scheduled for a restaging PET scan prior to a return office visit on 04/13/2012.   Thornton Papas, MD  03/22/2012  2:17 PM

## 2012-03-22 NOTE — Patient Instructions (Signed)
Thomas Doyle 1929/05/23 [id number]  [facility] Discharge Instructions  Your exam findings, labs and results were discussed with your MD today.  Filed Vitals:   03/22/12 1247  BP: 154/85  Pulse: 77  Temp: 97.6 F (36.4 C)  Resp: 20   Current outpatient prescriptions:acetaminophen (TYLENOL) 650 MG CR tablet, Take 650 mg by mouth every 8 (eight) hours as needed. As needed for right arm pain, Disp: , Rfl: ;  Alum & Mag Hydroxide-Simeth (MAGIC MOUTHWASH) SOLN, Take 5-10 mLs by mouth every 4 (four) hours as needed., Disp: 480 mL, Rfl: 0;  amiodarone (PACERONE) 200 MG tablet, Take 200 mg by mouth daily., Disp: , Rfl: ;  aspirin 81 MG tablet, Take 81 mg by mouth daily., Disp: , Rfl:  clopidogrel (PLAVIX) 75 MG tablet, Take 75 mg by mouth daily., Disp: , Rfl: ;  digoxin (LANOXIN) 0.125 MG tablet, Take 125 mcg by mouth daily., Disp: , Rfl: ;  fish oil-omega-3 fatty acids 1000 MG capsule, Take 1 g by mouth daily., Disp: , Rfl: ;  losartan (COZAAR) 50 MG tablet, Take 50 mg by mouth Daily., Disp: , Rfl: ;  polyethylene glycol (MIRALAX / GLYCOLAX) packet, Take 17 g by mouth daily as needed. , Disp: , Rfl:  promethazine (PHENERGAN) 12.5 MG tablet, Take 1 tablet (12.5 mg total) by mouth every 6 (six) hours as needed for nausea., Disp: 30 tablet, Rfl: 1;  senna-docusate (SENNA S) 8.6-50 MG per tablet, Take 1-2 tablets by mouth 2 (two) times daily as needed for constipation., Disp: , Rfl:   Please visit scheduling to obtain calendar for future appointments.  Please call the [facility] at [phone] during business hours should you have any further questions or need assistance in obtaining follow-up care. If you have a medical emergency, please dial 911.  Special Instructions:

## 2012-03-23 ENCOUNTER — Other Ambulatory Visit (HOSPITAL_BASED_OUTPATIENT_CLINIC_OR_DEPARTMENT_OTHER): Payer: Medicare Other | Admitting: Lab

## 2012-03-23 ENCOUNTER — Ambulatory Visit (HOSPITAL_BASED_OUTPATIENT_CLINIC_OR_DEPARTMENT_OTHER): Payer: Medicare Other

## 2012-03-23 ENCOUNTER — Telehealth: Payer: Self-pay | Admitting: Oncology

## 2012-03-23 VITALS — BP 158/54 | HR 54 | Temp 97.4°F

## 2012-03-23 DIAGNOSIS — C8510 Unspecified B-cell lymphoma, unspecified site: Secondary | ICD-10-CM

## 2012-03-23 DIAGNOSIS — C8589 Other specified types of non-Hodgkin lymphoma, extranodal and solid organ sites: Secondary | ICD-10-CM

## 2012-03-23 DIAGNOSIS — Z5112 Encounter for antineoplastic immunotherapy: Secondary | ICD-10-CM

## 2012-03-23 DIAGNOSIS — Z5111 Encounter for antineoplastic chemotherapy: Secondary | ICD-10-CM

## 2012-03-23 LAB — COMPREHENSIVE METABOLIC PANEL (CC13)
ALT: 11 U/L (ref 0–55)
CO2: 24 mEq/L (ref 22–29)
Creatinine: 1.3 mg/dL (ref 0.7–1.3)
Total Bilirubin: 0.5 mg/dL (ref 0.20–1.20)

## 2012-03-23 MED ORDER — VINCRISTINE SULFATE CHEMO INJECTION 1 MG/ML
1.0000 mg | Freq: Once | INTRAVENOUS | Status: AC
  Administered 2012-03-23: 1 mg via INTRAVENOUS
  Filled 2012-03-23: qty 1

## 2012-03-23 MED ORDER — DIPHENHYDRAMINE HCL 25 MG PO CAPS
50.0000 mg | ORAL_CAPSULE | Freq: Once | ORAL | Status: AC
  Administered 2012-03-23: 50 mg via ORAL

## 2012-03-23 MED ORDER — SODIUM CHLORIDE 0.9 % IV SOLN
Freq: Once | INTRAVENOUS | Status: AC
  Administered 2012-03-23: 09:00:00 via INTRAVENOUS

## 2012-03-23 MED ORDER — ONDANSETRON 16 MG/50ML IVPB (CHCC)
16.0000 mg | Freq: Once | INTRAVENOUS | Status: AC
  Administered 2012-03-23: 16 mg via INTRAVENOUS

## 2012-03-23 MED ORDER — SODIUM CHLORIDE 0.9 % IV SOLN
375.0000 mg/m2 | Freq: Once | INTRAVENOUS | Status: AC
  Administered 2012-03-23: 700 mg via INTRAVENOUS
  Filled 2012-03-23: qty 70

## 2012-03-23 MED ORDER — ACETAMINOPHEN 325 MG PO TABS
650.0000 mg | ORAL_TABLET | Freq: Once | ORAL | Status: AC
  Administered 2012-03-23: 650 mg via ORAL

## 2012-03-23 MED ORDER — SODIUM CHLORIDE 0.9 % IV SOLN
600.0000 mg/m2 | Freq: Once | INTRAVENOUS | Status: AC
  Administered 2012-03-23: 1080 mg via INTRAVENOUS
  Filled 2012-03-23: qty 54

## 2012-03-23 NOTE — Progress Notes (Signed)
VSS. Pt has no s/s of reaction.  Rate continued at 400 mg/hr= 182 ml x remainder of infusion.

## 2012-03-23 NOTE — Progress Notes (Signed)
Vincristine was pushed by Shellee Milo, RN slowly over 5 minutes with NS running by gravity.  Blood return was checked pre, post, and during push every 2-3 ml and was positive each time.  Pt tolerated well w/o complaints.

## 2012-03-23 NOTE — Telephone Encounter (Signed)
Pt is aware of appt for September 2013 lab, chemo and MD

## 2012-03-23 NOTE — Progress Notes (Signed)
1043- rituxan started at rate of 100 mg/hr= 45 ml/hr x 23 ml.

## 2012-03-23 NOTE — Progress Notes (Signed)
MD notified of BP.  Pt has no complaints.  Ok to continue, per Dr. Truett Perna, verbal order rec'd and read back.

## 2012-03-23 NOTE — Patient Instructions (Addendum)
[facility] Discharge Instructions for Patients Receiving Chemotherapy  Today you received the following chemotherapy agents Vincristine, Cytoxan, Rituxan  To help prevent nausea and vomiting after your treatment, we encourage you to take your nausea medication as directed by your MD.   If you develop nausea and vomiting that is not controlled by your nausea medication, call the clinic. If it is after clinic hours your family physician or the after hours number for the clinic or go to the Emergency Department.   BELOW ARE SYMPTOMS THAT SHOULD BE REPORTED IMMEDIATELY:  *FEVER GREATER THAN 100.5 F  *CHILLS WITH OR WITHOUT FEVER  NAUSEA AND VOMITING THAT IS NOT CONTROLLED WITH YOUR NAUSEA MEDICATION  *UNUSUAL SHORTNESS OF BREATH  *UNUSUAL BRUISING OR BLEEDING  TENDERNESS IN MOUTH AND THROAT WITH OR WITHOUT PRESENCE OF ULCERS  *URINARY PROBLEMS  *BOWEL PROBLEMS  UNUSUAL RASH Items with * indicate a potential emergency and should be followed up as soon as possible.   Feel free to call the clinic you have any questions or concerns. The clinic phone number is [phone].

## 2012-03-24 ENCOUNTER — Ambulatory Visit (HOSPITAL_BASED_OUTPATIENT_CLINIC_OR_DEPARTMENT_OTHER): Payer: Medicare Other

## 2012-03-24 VITALS — BP 163/73 | HR 69 | Temp 97.4°F | Resp 20

## 2012-03-24 DIAGNOSIS — Z5189 Encounter for other specified aftercare: Secondary | ICD-10-CM

## 2012-03-24 DIAGNOSIS — C8589 Other specified types of non-Hodgkin lymphoma, extranodal and solid organ sites: Secondary | ICD-10-CM

## 2012-03-24 DIAGNOSIS — D709 Neutropenia, unspecified: Secondary | ICD-10-CM

## 2012-03-24 MED ORDER — PEGFILGRASTIM INJECTION 6 MG/0.6ML
6.0000 mg | Freq: Once | SUBCUTANEOUS | Status: AC
  Administered 2012-03-24: 6 mg via SUBCUTANEOUS

## 2012-04-05 HISTORY — PX: DOPPLER ECHOCARDIOGRAPHY: SHX263

## 2012-04-12 ENCOUNTER — Other Ambulatory Visit: Payer: Self-pay | Admitting: Oncology

## 2012-04-13 ENCOUNTER — Telehealth: Payer: Self-pay | Admitting: Oncology

## 2012-04-13 ENCOUNTER — Other Ambulatory Visit (HOSPITAL_BASED_OUTPATIENT_CLINIC_OR_DEPARTMENT_OTHER): Payer: Medicare Other | Admitting: Lab

## 2012-04-13 ENCOUNTER — Ambulatory Visit (HOSPITAL_BASED_OUTPATIENT_CLINIC_OR_DEPARTMENT_OTHER): Payer: Medicare Other | Admitting: Oncology

## 2012-04-13 ENCOUNTER — Ambulatory Visit (HOSPITAL_BASED_OUTPATIENT_CLINIC_OR_DEPARTMENT_OTHER): Payer: Medicare Other

## 2012-04-13 VITALS — BP 131/66 | HR 57 | Temp 97.8°F | Resp 20

## 2012-04-13 VITALS — BP 150/60 | HR 70 | Temp 97.1°F | Resp 20 | Ht 70.0 in | Wt 147.1 lb

## 2012-04-13 DIAGNOSIS — Z5112 Encounter for antineoplastic immunotherapy: Secondary | ICD-10-CM

## 2012-04-13 DIAGNOSIS — C8589 Other specified types of non-Hodgkin lymphoma, extranodal and solid organ sites: Secondary | ICD-10-CM

## 2012-04-13 DIAGNOSIS — D6481 Anemia due to antineoplastic chemotherapy: Secondary | ICD-10-CM

## 2012-04-13 DIAGNOSIS — C8510 Unspecified B-cell lymphoma, unspecified site: Secondary | ICD-10-CM

## 2012-04-13 DIAGNOSIS — N189 Chronic kidney disease, unspecified: Secondary | ICD-10-CM

## 2012-04-13 DIAGNOSIS — D709 Neutropenia, unspecified: Secondary | ICD-10-CM

## 2012-04-13 DIAGNOSIS — T451X5A Adverse effect of antineoplastic and immunosuppressive drugs, initial encounter: Secondary | ICD-10-CM

## 2012-04-13 DIAGNOSIS — Z5111 Encounter for antineoplastic chemotherapy: Secondary | ICD-10-CM

## 2012-04-13 LAB — COMPREHENSIVE METABOLIC PANEL (CC13)
AST: 15 U/L (ref 5–34)
Alkaline Phosphatase: 115 U/L (ref 40–150)
BUN: 24 mg/dL (ref 7.0–26.0)
Creatinine: 1.3 mg/dL (ref 0.7–1.3)
Potassium: 4.2 mEq/L (ref 3.5–5.1)
Total Bilirubin: 0.8 mg/dL (ref 0.20–1.20)

## 2012-04-13 LAB — CBC WITH DIFFERENTIAL/PLATELET
BASO%: 1.1 % (ref 0.0–2.0)
EOS%: 3.8 % (ref 0.0–7.0)
HCT: 33.3 % — ABNORMAL LOW (ref 38.4–49.9)
LYMPH%: 9.4 % — ABNORMAL LOW (ref 14.0–49.0)
MCH: 31.4 pg (ref 27.2–33.4)
MCHC: 33.3 g/dL (ref 32.0–36.0)
MONO%: 10.6 % (ref 0.0–14.0)
NEUT%: 75.1 % — ABNORMAL HIGH (ref 39.0–75.0)
lymph#: 0.6 10*3/uL — ABNORMAL LOW (ref 0.9–3.3)

## 2012-04-13 MED ORDER — ACETAMINOPHEN 325 MG PO TABS
650.0000 mg | ORAL_TABLET | Freq: Once | ORAL | Status: AC
  Administered 2012-04-13: 650 mg via ORAL

## 2012-04-13 MED ORDER — VINCRISTINE SULFATE CHEMO INJECTION 1 MG/ML
1.0000 mg | Freq: Once | INTRAVENOUS | Status: AC
  Administered 2012-04-13: 1 mg via INTRAVENOUS
  Filled 2012-04-13: qty 1

## 2012-04-13 MED ORDER — ONDANSETRON 16 MG/50ML IVPB (CHCC)
16.0000 mg | Freq: Once | INTRAVENOUS | Status: AC
  Administered 2012-04-13: 16 mg via INTRAVENOUS

## 2012-04-13 MED ORDER — SODIUM CHLORIDE 0.9 % IV SOLN
Freq: Once | INTRAVENOUS | Status: AC
  Administered 2012-04-13: 09:00:00 via INTRAVENOUS

## 2012-04-13 MED ORDER — DIPHENHYDRAMINE HCL 25 MG PO CAPS
50.0000 mg | ORAL_CAPSULE | Freq: Once | ORAL | Status: AC
  Administered 2012-04-13: 50 mg via ORAL

## 2012-04-13 MED ORDER — SODIUM CHLORIDE 0.9 % IV SOLN
375.0000 mg/m2 | Freq: Once | INTRAVENOUS | Status: AC
  Administered 2012-04-13: 700 mg via INTRAVENOUS
  Filled 2012-04-13: qty 70

## 2012-04-13 MED ORDER — PREDNISONE 20 MG PO TABS
80.0000 mg | ORAL_TABLET | Freq: Every day | ORAL | Status: DC
  Administered 2012-04-13: 80 mg via ORAL

## 2012-04-13 MED ORDER — SODIUM CHLORIDE 0.9 % IV SOLN
600.0000 mg/m2 | Freq: Once | INTRAVENOUS | Status: AC
  Administered 2012-04-13: 1080 mg via INTRAVENOUS
  Filled 2012-04-13: qty 54

## 2012-04-13 NOTE — Patient Instructions (Signed)
[facility] Discharge Instructions for Patients Receiving Chemotherapy  Today you received the following chemotherapy agents Rituxan/Vincristine/Cytoxan To help prevent nausea and vomiting after your treatment, we encourage you to take your nausea medication as prescribed. If you develop nausea and vomiting that is not controlled by your nausea medication, call the clinic. If it is after clinic hours your family physician or the after hours number for the clinic or go to the Emergency Department.   BELOW ARE SYMPTOMS THAT SHOULD BE REPORTED IMMEDIATELY:  *FEVER GREATER THAN 100.5 F  *CHILLS WITH OR WITHOUT FEVER  NAUSEA AND VOMITING THAT IS NOT CONTROLLED WITH YOUR NAUSEA MEDICATION  *UNUSUAL SHORTNESS OF BREATH  *UNUSUAL BRUISING OR BLEEDING  TENDERNESS IN MOUTH AND THROAT WITH OR WITHOUT PRESENCE OF ULCERS  *URINARY PROBLEMS  *BOWEL PROBLEMS  UNUSUAL RASH Items with * indicate a potential emergency and should be followed up as soon as possible.  One of the nurses will contact you 24 hours after your treatment. Please let the nurse know about any problems that you may have experienced. Feel free to call the clinic you have any questions or concerns. The clinic phone number is [phone].   I have been informed and understand all the instructions given to me. I know to contact the clinic, my physician, or go to the Emergency Department if any problems should occur. I do not have any questions at this time, but understand that I may call the clinic during office hours   should I have any questions or need assistance in obtaining follow up care.    __________________________________________  _____________  __________ Signature of Patient or Authorized Representative            Date                   Time    __________________________________________ Nurse's Signature

## 2012-04-13 NOTE — Progress Notes (Signed)
   [facility]    OFFICE PROGRESS NOTE   INTERVAL HISTORY:   He completed another cycle of chemotherapy on 03/23/2012. He received Neulasta on 03/24/2012. No pain with the Neulasta. He feels well. No neuropathy symptoms.  He recently fell in a store and injured the right leg.  Objective:  Vital signs in last 24 hours:  Blood pressure 150/60, pulse 70, temperature 97.1 F (36.2 C), resp. rate 20, height 5\' 10"  (1.778 m), weight 147 lb 1.6 oz (66.724 kg).    HEENT: No thrush or ulcers Resp: Lungs clear bilaterally Cardio: Regular rate and rhythm GI: No hepatosplenomegaly, nontender Vascular: No leg edema  Skin: Resolving ecchymosis at the right lower leg    Lab Results:  Lab Results  Component Value Date   WBC 6.6 04/13/2012   HGB 11.1* 04/13/2012   HCT 33.3* 04/13/2012   MCV 94.3 04/13/2012   PLT 209 04/13/2012   ANC 5.0   Medications: I have reviewed the patient's current medications.  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 He completed cycle 4 of CVP/rituximab on 03/23/2012.  2. Hypercalcemia of malignancy-resolved following intravenous hydration, pamidronate, and chemotherapy  3. Anemia secondary to non-Hodgkin's lymphoma and chemotherapy-stable  4. Renal failure secondary to hypercalcemia and dehydration-improved  5. History of atrial fibrillation  6. COPD  7. Severe neutropenia following cycle 1 of CVP/rituximab-he received Neulasta beginning with cycle 2 , severe neutropenia following cycle 3 (he did not receive Neulasta)  8. 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 The numbness has improved.   Disposition:  He has completed 4 cycles of systemic therapy. There has been markedly clinical improvement. A restaging PET scan was ordered for this week, but the study was not scheduled.  The plan is to  proceed with cycle 5 of CVP-rituximab today. He will undergo a restaging PET scan evaluation prior to an office visit in 3 weeks.   Thornton Papas, MD  04/13/2012  8:46 PM

## 2012-04-13 NOTE — Telephone Encounter (Signed)
Gave pt appt for Pet scan and lab, MD and chemo for 05/04/12

## 2012-04-14 ENCOUNTER — Ambulatory Visit (HOSPITAL_BASED_OUTPATIENT_CLINIC_OR_DEPARTMENT_OTHER): Payer: Medicare Other

## 2012-04-14 VITALS — BP 175/62 | HR 72 | Temp 98.7°F | Resp 20

## 2012-04-14 DIAGNOSIS — C8589 Other specified types of non-Hodgkin lymphoma, extranodal and solid organ sites: Secondary | ICD-10-CM

## 2012-04-14 DIAGNOSIS — D709 Neutropenia, unspecified: Secondary | ICD-10-CM

## 2012-04-14 MED ORDER — PEGFILGRASTIM INJECTION 6 MG/0.6ML
6.0000 mg | Freq: Once | SUBCUTANEOUS | Status: AC
  Administered 2012-04-14: 6 mg via SUBCUTANEOUS

## 2012-04-20 ENCOUNTER — Encounter: Payer: Self-pay | Admitting: Oncology

## 2012-04-25 ENCOUNTER — Other Ambulatory Visit (HOSPITAL_COMMUNITY): Payer: Medicare Other

## 2012-04-26 ENCOUNTER — Encounter (HOSPITAL_COMMUNITY)
Admission: RE | Admit: 2012-04-26 | Discharge: 2012-04-26 | Disposition: A | Discharge status: Home / Self Care | Payer: Medicare Other | Source: Ambulatory Visit | Attending: Oncology | Admitting: Oncology

## 2012-04-26 DIAGNOSIS — C8510 Unspecified B-cell lymphoma, unspecified site: Secondary | ICD-10-CM

## 2012-04-26 DIAGNOSIS — C8589 Other specified types of non-Hodgkin lymphoma, extranodal and solid organ sites: Secondary | ICD-10-CM | POA: Insufficient documentation

## 2012-04-26 MED ORDER — FLUDEOXYGLUCOSE F - 18 (FDG) INJECTION
20.0000 | Freq: Once | INTRAVENOUS | Status: AC | PRN
  Administered 2012-04-26: 20 via INTRAVENOUS

## 2012-05-03 ENCOUNTER — Other Ambulatory Visit: Payer: Self-pay | Admitting: Oncology

## 2012-05-04 ENCOUNTER — Other Ambulatory Visit: Payer: Self-pay | Admitting: Nurse Practitioner

## 2012-05-04 ENCOUNTER — Telehealth: Payer: Self-pay | Admitting: Oncology

## 2012-05-04 ENCOUNTER — Other Ambulatory Visit (HOSPITAL_BASED_OUTPATIENT_CLINIC_OR_DEPARTMENT_OTHER): Payer: Medicare Other

## 2012-05-04 ENCOUNTER — Ambulatory Visit (HOSPITAL_BASED_OUTPATIENT_CLINIC_OR_DEPARTMENT_OTHER): Payer: Medicare Other | Admitting: Oncology

## 2012-05-04 ENCOUNTER — Other Ambulatory Visit: Payer: Self-pay | Admitting: Oncology

## 2012-05-04 ENCOUNTER — Ambulatory Visit (HOSPITAL_BASED_OUTPATIENT_CLINIC_OR_DEPARTMENT_OTHER): Payer: Medicare Other

## 2012-05-04 VITALS — BP 110/52 | HR 53 | Temp 97.2°F | Resp 20

## 2012-05-04 VITALS — BP 124/71 | HR 62 | Temp 97.0°F | Resp 20 | Ht 70.0 in | Wt 146.9 lb

## 2012-05-04 DIAGNOSIS — Z23 Encounter for immunization: Secondary | ICD-10-CM

## 2012-05-04 DIAGNOSIS — C8510 Unspecified B-cell lymphoma, unspecified site: Secondary | ICD-10-CM

## 2012-05-04 DIAGNOSIS — C8589 Other specified types of non-Hodgkin lymphoma, extranodal and solid organ sites: Secondary | ICD-10-CM

## 2012-05-04 DIAGNOSIS — D6481 Anemia due to antineoplastic chemotherapy: Secondary | ICD-10-CM

## 2012-05-04 DIAGNOSIS — Z5112 Encounter for antineoplastic immunotherapy: Secondary | ICD-10-CM

## 2012-05-04 DIAGNOSIS — D702 Other drug-induced agranulocytosis: Secondary | ICD-10-CM

## 2012-05-04 DIAGNOSIS — Z5111 Encounter for antineoplastic chemotherapy: Secondary | ICD-10-CM

## 2012-05-04 LAB — CBC WITH DIFFERENTIAL/PLATELET
Basophils Absolute: 0.1 10*3/uL (ref 0.0–0.1)
EOS%: 5.7 % (ref 0.0–7.0)
HCT: 31 % — ABNORMAL LOW (ref 38.4–49.9)
HGB: 10.2 g/dL — ABNORMAL LOW (ref 13.0–17.1)
LYMPH%: 16.5 % (ref 14.0–49.0)
MCH: 30.7 pg (ref 27.2–33.4)
MCHC: 32.9 g/dL (ref 32.0–36.0)
MCV: 93.4 fL (ref 79.3–98.0)
MONO%: 12.2 % (ref 0.0–14.0)
NEUT%: 64.2 % (ref 39.0–75.0)
Platelets: 192 10*3/uL (ref 140–400)

## 2012-05-04 LAB — COMPREHENSIVE METABOLIC PANEL (CC13)
Albumin: 3.8 g/dL (ref 3.5–5.0)
Alkaline Phosphatase: 113 U/L (ref 40–150)
BUN: 22 mg/dL (ref 7.0–26.0)
Creatinine: 1.3 mg/dL (ref 0.7–1.3)
Glucose: 79 mg/dl (ref 70–99)
Potassium: 4.2 mEq/L (ref 3.5–5.1)
Total Bilirubin: 0.6 mg/dL (ref 0.20–1.20)

## 2012-05-04 MED ORDER — PROMETHAZINE HCL 25 MG RE SUPP: 25.0000 mg | Freq: Three times a day (TID) | RECTAL | Status: DC | PRN

## 2012-05-04 MED ORDER — SODIUM CHLORIDE 0.9 % IV SOLN
375.0000 mg/m2 | Freq: Once | INTRAVENOUS | Status: AC
  Administered 2012-05-04: 700 mg via INTRAVENOUS
  Filled 2012-05-04: qty 70

## 2012-05-04 MED ORDER — INFLUENZA VIRUS VACC SPLIT PF IM SUSP
0.5000 mL | Freq: Once | INTRAMUSCULAR | Status: DC
  Filled 2012-05-04: qty 0.5

## 2012-05-04 MED ORDER — VINCRISTINE SULFATE CHEMO INJECTION 1 MG/ML
1.0000 mg | Freq: Once | INTRAVENOUS | Status: AC
  Administered 2012-05-04: 1 mg via INTRAVENOUS
  Filled 2012-05-04: qty 1

## 2012-05-04 MED ORDER — ACETAMINOPHEN 325 MG PO TABS
650.0000 mg | ORAL_TABLET | Freq: Once | ORAL | Status: AC
  Administered 2012-05-04: 650 mg via ORAL

## 2012-05-04 MED ORDER — DIPHENHYDRAMINE HCL 25 MG PO CAPS
50.0000 mg | ORAL_CAPSULE | Freq: Once | ORAL | Status: AC
  Administered 2012-05-04: 50 mg via ORAL

## 2012-05-04 MED ORDER — ONDANSETRON 16 MG/50ML IVPB (CHCC)
16.0000 mg | Freq: Once | INTRAVENOUS | Status: AC
  Administered 2012-05-04: 16 mg via INTRAVENOUS

## 2012-05-04 MED ORDER — VINCRISTINE SULFATE CHEMO INJECTION 1 MG/ML: 1.0000 mg | Freq: Once | INTRAVENOUS | Status: DC

## 2012-05-04 MED ORDER — SODIUM CHLORIDE 0.9 % IV SOLN
600.0000 mg/m2 | Freq: Once | INTRAVENOUS | Status: AC
  Administered 2012-05-04: 1080 mg via INTRAVENOUS
  Filled 2012-05-04: qty 54

## 2012-05-04 MED ORDER — INFLUENZA VIRUS VACC SPLIT PF IM SUSP
0.5000 mL | Freq: Once | INTRAMUSCULAR | Status: AC
  Administered 2012-05-04: 0.5 mL via INTRAMUSCULAR
  Filled 2012-05-04: qty 0.5

## 2012-05-04 NOTE — Progress Notes (Signed)
   [facility]    OFFICE PROGRESS NOTE   INTERVAL HISTORY:   He returns as scheduled. He completed another cycle of CVP-rituximab on 04/13/2012. He received Neulasta support. He reports tolerating the chemotherapy well. No mouth sores, nausea, diarrhea, or neuropathy symptoms. He reports a good appetite.  Objective:  Vital signs in last 24 hours:  Blood pressure 124/71, pulse 62, temperature 97 F (36.1 C), temperature source Oral, resp. rate 20, height 5\' 10"  (1.778 m), weight 146 lb 14.4 oz (66.633 kg).    HEENT: No thrush or ulcers Lymphatics: No cervical, supraclavicular, axillary, or inguinal nodes Resp: Lungs clear bilaterally Cardio: Regular rate and rhythm GI: No hepatosplenomegaly Vascular: No leg edema   Lab Results:  Lab Results  Component Value Date   WBC 3.7* 05/04/2012   HGB 10.2* 05/04/2012   HCT 31.0* 05/04/2012   MCV 93.4 05/04/2012   PLT 192 05/04/2012   ANC 2.4  X-rays: Restaging PET scan on 04/26/2012, compared to may 20th 2013-no hypermetabolic lymph nodes in the neck, chest, abdomen, or pelvis. Mild heterogenous bone marrow uptake, mildly improved from the previous study. Prior borderline enlarged retroperitoneal lymph nodes are now within normal limits and are not FDG avid  Medications: I have reviewed the patient's current medications.  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 He completed cycle 5 of CVP/rituximab on 04/13/2012. Restaging PET scan on 04/26/2012 with persistent mild heterogenous bone marrow uptake and no other evidence of active lymphoma.  2. Hypercalcemia of malignancy-resolved following intravenous hydration, pamidronate, and chemotherapy  3. Anemia secondary to non-Hodgkin's lymphoma and chemotherapy-stable  4. Renal failure secondary to hypercalcemia and dehydration-improved  5. History of atrial fibrillation  6. COPD  7. Severe  neutropenia following cycle 1 of CVP/rituximab-he received Neulasta beginning with cycle 2 , severe neutropenia following cycle 3 (he did not receive Neulasta)  8. 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 The numbness has resolved.  Disposition:  He has completed 5 cycles of systemic therapy. His performance status is markedly improved. The persistent bone marrow activity on the 04/26/2012 PET scan could be related to residual lymphoma versus an indicator of marrow recovery.  The plan is to proceed with the final planned cycle of CVP-rituximab today. Mr. Tuohey will return for an office and lab visit in one month. He will receive an influenza vaccine today.   Thornton Papas, MD  05/04/2012  9:47 AM

## 2012-05-04 NOTE — Telephone Encounter (Signed)
appts made and printed for pt aom °

## 2012-05-05 ENCOUNTER — Ambulatory Visit (HOSPITAL_BASED_OUTPATIENT_CLINIC_OR_DEPARTMENT_OTHER): Payer: Medicare Other

## 2012-05-05 VITALS — BP 131/67 | HR 62 | Temp 98.0°F | Resp 20

## 2012-05-05 DIAGNOSIS — C8589 Other specified types of non-Hodgkin lymphoma, extranodal and solid organ sites: Secondary | ICD-10-CM

## 2012-05-05 DIAGNOSIS — D709 Neutropenia, unspecified: Secondary | ICD-10-CM

## 2012-05-05 MED ORDER — PEGFILGRASTIM INJECTION 6 MG/0.6ML
6.0000 mg | Freq: Once | SUBCUTANEOUS | Status: AC
  Administered 2012-05-05: 6 mg via SUBCUTANEOUS

## 2012-06-01 ENCOUNTER — Other Ambulatory Visit (HOSPITAL_BASED_OUTPATIENT_CLINIC_OR_DEPARTMENT_OTHER): Payer: Medicare Other

## 2012-06-01 ENCOUNTER — Ambulatory Visit (HOSPITAL_BASED_OUTPATIENT_CLINIC_OR_DEPARTMENT_OTHER): Payer: Medicare Other | Admitting: Oncology

## 2012-06-01 ENCOUNTER — Telehealth: Payer: Self-pay | Admitting: Oncology

## 2012-06-01 VITALS — BP 127/49 | HR 58 | Temp 97.8°F | Resp 20 | Ht 70.0 in | Wt 147.7 lb

## 2012-06-01 DIAGNOSIS — D702 Other drug-induced agranulocytosis: Secondary | ICD-10-CM

## 2012-06-01 DIAGNOSIS — C8589 Other specified types of non-Hodgkin lymphoma, extranodal and solid organ sites: Secondary | ICD-10-CM

## 2012-06-01 DIAGNOSIS — C8510 Unspecified B-cell lymphoma, unspecified site: Secondary | ICD-10-CM

## 2012-06-01 DIAGNOSIS — D630 Anemia in neoplastic disease: Secondary | ICD-10-CM

## 2012-06-01 LAB — CBC WITH DIFFERENTIAL/PLATELET
Basophils Absolute: 0.1 10*3/uL (ref 0.0–0.1)
Eosinophils Absolute: 0.2 10*3/uL (ref 0.0–0.5)
HGB: 10.5 g/dL — ABNORMAL LOW (ref 13.0–17.1)
LYMPH%: 19 % (ref 14.0–49.0)
MONO#: 0.4 10*3/uL (ref 0.1–0.9)
NEUT#: 1.1 10*3/uL — ABNORMAL LOW (ref 1.5–6.5)
Platelets: 183 10*3/uL (ref 140–400)
RBC: 3.12 10*6/uL — ABNORMAL LOW (ref 4.20–5.82)
RDW: 16.9 % — ABNORMAL HIGH (ref 11.0–14.6)
WBC: 2.3 10*3/uL — ABNORMAL LOW (ref 4.0–10.3)

## 2012-06-01 LAB — LACTATE DEHYDROGENASE (CC13): LDH: 166 U/L (ref 125–245)

## 2012-06-01 LAB — COMPREHENSIVE METABOLIC PANEL (CC13)
Albumin: 4 g/dL (ref 3.5–5.0)
CO2: 26 mEq/L (ref 22–29)
Glucose: 79 mg/dl (ref 70–99)
Potassium: 3.9 mEq/L (ref 3.5–5.1)
Sodium: 141 mEq/L (ref 136–145)
Total Protein: 6.1 g/dL — ABNORMAL LOW (ref 6.4–8.3)

## 2012-06-01 NOTE — Progress Notes (Signed)
Main complaint is of persistent weakness and fatigue, but admits that he has not been walking or exercising as he should. Suggested he start taking walks 1-2 times daily and do some light hand weights 3 times week to gain strength.

## 2012-06-01 NOTE — Progress Notes (Signed)
   [facility]    OFFICE PROGRESS NOTE   INTERVAL HISTORY:   He returns as scheduled. He continues to have some malaise. He is getting out of the house. No pain.  Objective:  Vital signs in last 24 hours:  Blood pressure 127/49, pulse 58, temperature 97.8 F (36.6 C), temperature source Oral, resp. rate 20, height 5\' 10"  (1.778 m), weight 147 lb 11.2 oz (66.996 kg).    HEENT: Neck without mass Lymphatics: No cervical, supraclavicular, axillary, or inguinal nodes Resp: Lungs clear bilaterally Cardio: Regular rate and rhythm GI: No hepatosplenomegaly Vascular: No leg edema   Lab Results:  Lab Results  Component Value Date   WBC 2.3* 06/01/2012   HGB 10.5* 06/01/2012   HCT 30.1* 06/01/2012   MCV 96.3 06/01/2012   PLT 183 06/01/2012   ANC 1.1 Calcium 9.6, albumin 4.0, BUN 25, creatinine 1.2  Medications: I have reviewed the patient's current medications.  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 He completed cycle 5 of CVP/rituximab on 04/13/2012. Restaging PET scan on 04/26/2012 with persistent mild heterogenous bone marrow uptake and no other evidence of active lymphoma. He completed cycle 6 of CVP/rituximab on 05/04/2012. 2. Hypercalcemia of malignancy-resolved following intravenous hydration, pamidronate, and chemotherapy  3. Anemia secondary to non-Hodgkin's lymphoma and chemotherapy-stable  4. Renal failure secondary to hypercalcemia and dehydration-improved  5. History of atrial fibrillation  6. COPD  7. Severe neutropenia following cycle 1 of CVP/rituximab-he received Neulasta beginning with cycle 2 , severe neutropenia following cycle 3 (he did not receive Neulasta) . Mild neutropenia today is likely related to chemotherapy/rituximab. 8. 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 The numbness has resolved.     Disposition:  He completed cycle 6 of CVP-rituximab on 05/04/2012. He is in clinical remission from the non-Hodgkin's lipoma. [name] will return for an office and lab visit in one month.   Thornton Papas, MD  06/01/2012  12:12 PM

## 2012-06-01 NOTE — Telephone Encounter (Signed)
gv and printed pt appt for DEC

## 2012-07-02 ENCOUNTER — Telehealth: Payer: Self-pay | Admitting: Oncology

## 2012-07-02 ENCOUNTER — Other Ambulatory Visit (HOSPITAL_BASED_OUTPATIENT_CLINIC_OR_DEPARTMENT_OTHER): Payer: Medicare Other | Admitting: Lab

## 2012-07-02 ENCOUNTER — Ambulatory Visit (HOSPITAL_BASED_OUTPATIENT_CLINIC_OR_DEPARTMENT_OTHER): Payer: Medicare Other | Admitting: Oncology

## 2012-07-02 VITALS — BP 140/69 | HR 54 | Temp 96.9°F | Resp 18 | Ht 70.0 in | Wt 150.0 lb

## 2012-07-02 DIAGNOSIS — R209 Unspecified disturbances of skin sensation: Secondary | ICD-10-CM

## 2012-07-02 DIAGNOSIS — C8589 Other specified types of non-Hodgkin lymphoma, extranodal and solid organ sites: Secondary | ICD-10-CM

## 2012-07-02 DIAGNOSIS — D6481 Anemia due to antineoplastic chemotherapy: Secondary | ICD-10-CM

## 2012-07-02 DIAGNOSIS — C8510 Unspecified B-cell lymphoma, unspecified site: Secondary | ICD-10-CM

## 2012-07-02 DIAGNOSIS — D630 Anemia in neoplastic disease: Secondary | ICD-10-CM

## 2012-07-02 LAB — CBC WITH DIFFERENTIAL/PLATELET
BASO%: 1.9 % (ref 0.0–2.0)
MCHC: 33 g/dL (ref 32.0–36.0)
MONO#: 0.6 10*3/uL (ref 0.1–0.9)
RBC: 3.29 10*6/uL — ABNORMAL LOW (ref 4.20–5.82)
RDW: 14.9 % — ABNORMAL HIGH (ref 11.0–14.6)
WBC: 3.2 10*3/uL — ABNORMAL LOW (ref 4.0–10.3)
lymph#: 0.6 10*3/uL — ABNORMAL LOW (ref 0.9–3.3)
nRBC: 0 % (ref 0–0)

## 2012-07-02 LAB — COMPREHENSIVE METABOLIC PANEL (CC13)
ALT: 15 U/L (ref 0–55)
AST: 13 U/L (ref 5–34)
Albumin: 4.1 g/dL (ref 3.5–5.0)
Calcium: 9.3 mg/dL (ref 8.4–10.4)
Chloride: 108 mEq/L — ABNORMAL HIGH (ref 98–107)
Creatinine: 1.3 mg/dL (ref 0.7–1.3)
Potassium: 4.1 mEq/L (ref 3.5–5.1)

## 2012-07-02 NOTE — Progress Notes (Signed)
   [facility]    OFFICE PROGRESS NOTE   INTERVAL HISTORY:   He returns as scheduled. He reports a good appetite. No fever or night sweats. No complaint.  Objective:  Vital signs in last 24 hours:  Blood pressure 140/69, pulse 54, temperature 96.9 F (36.1 C), temperature source Oral, resp. rate 18, height 5\' 10"  (1.778 m), weight 150 lb (68.04 kg).    HEENT: Neck without mass, no thrush or ulcers Lymphatics: No cervical, supraclavicular, axillary, or inguinal nodes Resp: Lungs clear bilaterally Cardio: Regular rate and rhythm GI: No hepatomegaly Vascular: No leg edema   Lab Results:  Lab Results  Component Value Date   WBC 3.2* 07/02/2012   HGB 10.5* 07/02/2012   HCT 31.8* 07/02/2012   MCV 96.7 07/02/2012   PLT 163 07/02/2012   ANC 1.7 BUN 23, creatinine 1.3, potassium 4.1, calcium 9.3, albumin 4.1   Medications: I have reviewed the patient's current medications.  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 He completed cycle 5 of CVP/rituximab on 04/13/2012. Restaging PET scan on 04/26/2012 with persistent mild heterogenous bone marrow uptake and no other evidence of active lymphoma. He completed cycle 6 of CVP/rituximab on 05/04/2012.  2. Hypercalcemia of malignancy-resolved following intravenous hydration, pamidronate, and chemotherapy  3. Anemia secondary to non-Hodgkin's lymphoma and chemotherapy-stable  4. Renal failure secondary to hypercalcemia and dehydration-improved  5. History of atrial fibrillation  6. COPD  7. Severe neutropenia following cycle 1 of CVP/rituximab-he received Neulasta beginning with cycle 2 , severe neutropenia following cycle 3 (he did not receive Neulasta) ..  8. 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 The numbness has resolved.   Disposition:  Thomas Doyle remains in clinical  remission from the non-Hodgkin's lymphoma. He will return for an office and lab visit in 3 months. He will contact us in the interim for new symptoms.   Thornton Papas, MD  07/02/2012  6:40 PM

## 2012-07-02 NOTE — Telephone Encounter (Signed)
appts made and printed for pt aom °

## 2012-10-09 ENCOUNTER — Ambulatory Visit (HOSPITAL_BASED_OUTPATIENT_CLINIC_OR_DEPARTMENT_OTHER): Payer: Medicare Other | Admitting: Oncology

## 2012-10-09 ENCOUNTER — Telehealth: Payer: Self-pay | Admitting: Oncology

## 2012-10-09 ENCOUNTER — Other Ambulatory Visit (HOSPITAL_BASED_OUTPATIENT_CLINIC_OR_DEPARTMENT_OTHER): Payer: Medicare Other | Admitting: Lab

## 2012-10-09 VITALS — BP 155/76 | HR 71 | Temp 97.4°F | Resp 18 | Ht 70.0 in | Wt 148.6 lb

## 2012-10-09 DIAGNOSIS — C8589 Other specified types of non-Hodgkin lymphoma, extranodal and solid organ sites: Secondary | ICD-10-CM

## 2012-10-09 DIAGNOSIS — C8510 Unspecified B-cell lymphoma, unspecified site: Secondary | ICD-10-CM

## 2012-10-09 LAB — CBC WITH DIFFERENTIAL/PLATELET
BASO%: 1.2 % (ref 0.0–2.0)
EOS%: 4.6 % (ref 0.0–7.0)
HCT: 35.4 % — ABNORMAL LOW (ref 38.4–49.9)
MCH: 32.3 pg (ref 27.2–33.4)
MCHC: 34.8 g/dL (ref 32.0–36.0)
MONO%: 14.9 % — ABNORMAL HIGH (ref 0.0–14.0)
NEUT%: 56 % (ref 39.0–75.0)
lymph#: 0.8 10*3/uL — ABNORMAL LOW (ref 0.9–3.3)

## 2012-10-09 LAB — COMPREHENSIVE METABOLIC PANEL (CC13)
AST: 15 U/L (ref 5–34)
Alkaline Phosphatase: 122 U/L (ref 40–150)
BUN: 19.9 mg/dL (ref 7.0–26.0)
Creatinine: 1.2 mg/dL (ref 0.7–1.3)
Potassium: 4 mEq/L (ref 3.5–5.1)
Total Bilirubin: 1.19 mg/dL (ref 0.20–1.20)

## 2012-10-09 NOTE — Progress Notes (Signed)
   [facility]    OFFICE PROGRESS NOTE   INTERVAL HISTORY:   He returns as scheduled. He feels well. Good appetite and energy level. He is in the process of returning to his home. He plans to begin an exercise program with "Silver sneakers ".  Objective:  Vital signs in last 24 hours:  Blood pressure 155/76, pulse 71, temperature 97.4 F (36.3 C), temperature source Oral, resp. rate 18, height 5\' 10"  (1.778 m), weight 148 lb 9.6 oz (67.405 kg).    HEENT: Neck without mass Lymphatics: No cervical, supraclavicular, axillary, or inguinal nodes Resp: Lungs clear bilaterally Cardio: Regular rate and rhythm GI: No hepatosplenomegaly, nontender, no mass Vascular: No leg edema Lab Results:  Lab Results  Component Value Date   WBC 3.3* 10/09/2012   HGB 12.3* 10/09/2012   HCT 35.4* 10/09/2012   MCV 92.8 10/09/2012   PLT 155 10/09/2012   ANC 1.9  BUN 19.9, creatinine 1.2, calcium 9.3, albumin 4.0, bilirubin 1.19    Medications: I have reviewed the patient's current medications.  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 He completed cycle 5 of CVP/rituximab on 04/13/2012. Restaging PET scan on 04/26/2012 with persistent mild heterogenous bone marrow uptake and no other evidence of active lymphoma. He completed cycle 6 of CVP/rituximab on 05/04/2012.  2. Hypercalcemia of malignancy-resolved following intravenous hydration, pamidronate, and chemotherapy  3. Anemia secondary to non-Hodgkin's lymphoma and chemotherapy-resolved 4. Renal failure secondary to hypercalcemia and dehydration-improved  5. History of atrial fibrillation  6. COPD  7. Severe neutropenia following cycle 1 of CVP/rituximab-he received Neulasta beginning with cycle 2 , severe neutropenia following cycle 3 (he did not receive Neulasta) .[name]  8. history of Numbness in the feet/toes-? 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    Disposition:  Thomas Doyle remains in clinical remission from non-Hodgkin's lymphoma. He would like to followup here at a less frequent interval. He will contact us for new symptoms. Thomas Doyle will return for an office and lab visit in 6 months.   Thornton Papas, MD  10/09/2012  10:14 AM

## 2012-10-12 ENCOUNTER — Telehealth: Payer: Self-pay | Admitting: *Deleted

## 2012-10-12 ENCOUNTER — Telehealth: Payer: Self-pay | Admitting: Oncology

## 2012-10-12 ENCOUNTER — Other Ambulatory Visit: Payer: Self-pay | Admitting: *Deleted

## 2012-10-12 DIAGNOSIS — C8510 Unspecified B-cell lymphoma, unspecified site: Secondary | ICD-10-CM

## 2012-10-12 NOTE — Telephone Encounter (Signed)
Reviewed lab results with patient and significance of LDH. Agrees to recheck in 3 months. Prefers Thurs or Friday am appointment.

## 2012-10-12 NOTE — Telephone Encounter (Signed)
Left VM for patient to call for lab results when he is available.

## 2012-10-12 NOTE — Telephone Encounter (Signed)
Message copied by Wandalee Ferdinand on Fri Oct 12, 2012  3:11 PM ------      Message from: Thornton Papas B      Created: Tue Oct 09, 2012  9:35 PM       Please call patient, ldh mildly elevated, repeat cbc,cmet, ldh 3 months ------

## 2012-10-12 NOTE — Telephone Encounter (Signed)
Called pt and left message regarding appt for June 2014 lab and MD

## 2012-11-14 ENCOUNTER — Other Ambulatory Visit (HOSPITAL_COMMUNITY): Payer: Self-pay | Admitting: Cardiovascular Disease

## 2012-11-14 DIAGNOSIS — I714 Abdominal aortic aneurysm, without rupture, unspecified: Secondary | ICD-10-CM

## 2012-11-20 ENCOUNTER — Ambulatory Visit (HOSPITAL_COMMUNITY)
Admission: RE | Admit: 2012-11-20 | Discharge: 2012-11-20 | Disposition: A | Discharge status: Home / Self Care | Payer: Medicare Other | Source: Ambulatory Visit | Attending: Cardiovascular Disease | Admitting: Cardiovascular Disease

## 2012-11-20 DIAGNOSIS — I714 Abdominal aortic aneurysm, without rupture, unspecified: Secondary | ICD-10-CM | POA: Insufficient documentation

## 2012-11-20 HISTORY — PX: OTHER SURGICAL HISTORY: SHX169

## 2012-11-20 NOTE — Progress Notes (Signed)
Abdominal aortic duplex doppler was completed. Larene Pickett

## 2012-12-31 ENCOUNTER — Telehealth: Payer: Self-pay | Admitting: Cardiovascular Disease

## 2012-12-31 NOTE — Telephone Encounter (Signed)
pt needs a refill on his clopidogrel 75 mg tab X90 .. pt is down [facility] [address]

## 2013-01-01 ENCOUNTER — Telehealth: Payer: Self-pay | Admitting: Cardiovascular Disease

## 2013-01-01 NOTE — Telephone Encounter (Signed)
Pt called yesterday to have [phone]. Pt stated that it has not been done yet and he only has 1 pill left. He would like for someone to call this Rx in. [facility].

## 2013-01-02 NOTE — Telephone Encounter (Signed)
Per Rande Brunt, CMA, refill done in [facility] this morning.  Call to pt and informed.  Pt also informed of refill process and verbalized understanding.

## 2013-01-11 ENCOUNTER — Other Ambulatory Visit (HOSPITAL_BASED_OUTPATIENT_CLINIC_OR_DEPARTMENT_OTHER): Payer: Medicare Other | Admitting: Lab

## 2013-01-11 ENCOUNTER — Ambulatory Visit (HOSPITAL_BASED_OUTPATIENT_CLINIC_OR_DEPARTMENT_OTHER): Payer: Medicare Other | Admitting: Oncology

## 2013-01-11 ENCOUNTER — Telehealth: Payer: Self-pay | Admitting: Oncology

## 2013-01-11 VITALS — BP 155/68 | HR 58 | Temp 97.5°F | Resp 20 | Ht 70.0 in | Wt 147.8 lb

## 2013-01-11 DIAGNOSIS — R7401 Elevation of levels of liver transaminase levels: Secondary | ICD-10-CM

## 2013-01-11 DIAGNOSIS — C8510 Unspecified B-cell lymphoma, unspecified site: Secondary | ICD-10-CM

## 2013-01-11 DIAGNOSIS — R32 Unspecified urinary incontinence: Secondary | ICD-10-CM

## 2013-01-11 DIAGNOSIS — C8589 Other specified types of non-Hodgkin lymphoma, extranodal and solid organ sites: Secondary | ICD-10-CM

## 2013-01-11 LAB — CBC WITH DIFFERENTIAL/PLATELET
Basophils Absolute: 0.1 10*3/uL (ref 0.0–0.1)
EOS%: 3.4 % (ref 0.0–7.0)
Eosinophils Absolute: 0.2 10*3/uL (ref 0.0–0.5)
HCT: 33.9 % — ABNORMAL LOW (ref 38.4–49.9)
HGB: 11.8 g/dL — ABNORMAL LOW (ref 13.0–17.1)
MCH: 33.1 pg (ref 27.2–33.4)
MCV: 95.2 fL (ref 79.3–98.0)
MONO%: 7.6 % (ref 0.0–14.0)
NEUT%: 69.9 % (ref 39.0–75.0)
Platelets: 150 10*3/uL (ref 140–400)

## 2013-01-11 LAB — COMPREHENSIVE METABOLIC PANEL (CC13)
ALT: 9 U/L (ref 0–55)
AST: 13 U/L (ref 5–34)
Albumin: 3.9 g/dL (ref 3.5–5.0)
CO2: 25 mEq/L (ref 22–29)
Calcium: 9.1 mg/dL (ref 8.4–10.4)
Chloride: 106 mEq/L (ref 98–107)
Potassium: 4.2 mEq/L (ref 3.5–5.1)
Sodium: 139 mEq/L (ref 136–145)
Total Protein: 6.2 g/dL — ABNORMAL LOW (ref 6.4–8.3)

## 2013-01-11 LAB — LACTATE DEHYDROGENASE (CC13): LDH: 259 U/L — ABNORMAL HIGH (ref 125–245)

## 2013-01-11 LAB — TECHNOLOGIST REVIEW

## 2013-01-11 NOTE — Progress Notes (Signed)
   [facility]    OFFICE PROGRESS NOTE   INTERVAL HISTORY:   Mr. Gerst returns as scheduled. He complains of generalized "weakness ". He has difficulty ambulating long distances secondary to weakness. He reports urinary incontinence. He has been evaluated by urology. He denies pain and anorexia. The weakness has not changed since he was here 3 months ago.  Objective:  Vital signs in last 24 hours:  Blood pressure 155/68, pulse 58, temperature 97.5 F (36.4 C), temperature source Oral, resp. rate 20, height 5\' 10"  (1.778 m), weight 147 lb 12.8 oz (67.042 kg).    HEENT: Neck without mass Lymphatics: No cervical, supraclavicular, axillary, or inguinal nodes Resp: Lungs clear bilaterally Cardio: Regular rate and rhythm GI: No hepatosplenomegaly, no mass Vascular: No leg edema Neuro: Alert and oriented    Lab Results:  Lab Results  Component Value Date   WBC 6.2 01/11/2013   HGB 11.8* 01/11/2013   HCT 33.9* 01/11/2013   MCV 95.2 01/11/2013   PLT 150 01/11/2013   ANC 4.3  BUN 24.2, creatinine 1.3, calcium 9.1, LDH 259    Medications: I have reviewed the patient's current medications.  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 He completed cycle 5 of CVP/rituximab on 04/13/2012. Restaging PET scan on 04/26/2012 with persistent mild heterogenous bone marrow uptake and no other evidence of active lymphoma. He completed cycle 6 of CVP/rituximab on 05/04/2012.  2. Hypercalcemia of malignancy-resolved following intravenous hydration, pamidronate, and chemotherapy  3. Anemia secondary to non-Hodgkin's lymphoma and chemotherapy-resolved  4. Renal failure secondary to hypercalcemia and dehydration-improved  5. History of atrial fibrillation  6. COPD  7. Severe neutropenia following cycle 1 of CVP/rituximab-he received Neulasta beginning with cycle 2 , severe neutropenia following cycle 3 (he  did not receive Neulasta) .[name]  8. history of 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  9. Urinary incontinence-? Etiology, now followed by urology   Disposition:  Mr. Bones remains in clinical remission from non-Hodgkin's lymphoma. The LDH is mildly elevated, but there is no other sign of progressive lymphoma. He will return for an office and lab visit in 3 months. He has difficulty ambulating long distances. He requested a handicap license plate.   Thornton Papas, MD  01/11/2013  6:39 PM

## 2013-01-11 NOTE — Telephone Encounter (Signed)
Gave pt appt for lab and MD for September 2014

## 2013-01-22 ENCOUNTER — Other Ambulatory Visit: Payer: Self-pay | Admitting: *Deleted

## 2013-01-22 MED ORDER — DIGOXIN 125 MCG PO TABS: 125.0000 ug | ORAL_TABLET | Freq: Every day | ORAL | Status: DC

## 2013-01-22 NOTE — Telephone Encounter (Signed)
Called in rx for digoxin

## 2013-02-27 ENCOUNTER — Other Ambulatory Visit: Payer: Self-pay

## 2013-03-16 IMAGING — CT CT BIOPSY
1 of 2 series · 2 of 6 positions shown, 5 images · non-contrast
Comparison: none

CLINICAL HISTORY: 82 year-old with anemia.

[Series 5: add scan 5.0 b70f · axial · 0.81mm/px · z∈[-133,-128]mm · 2 of 4 slices shown, 5 images]
[im 2/4  soft-tissue]
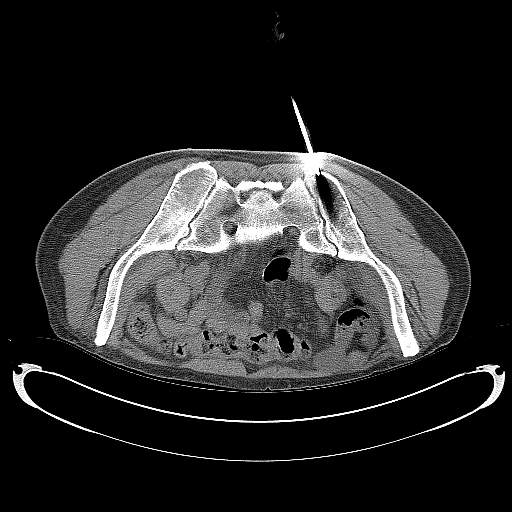
[im 2/4  lung]
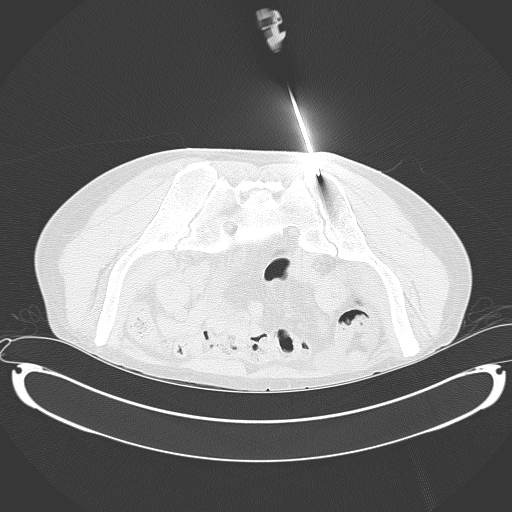
[im 2/4  bone]
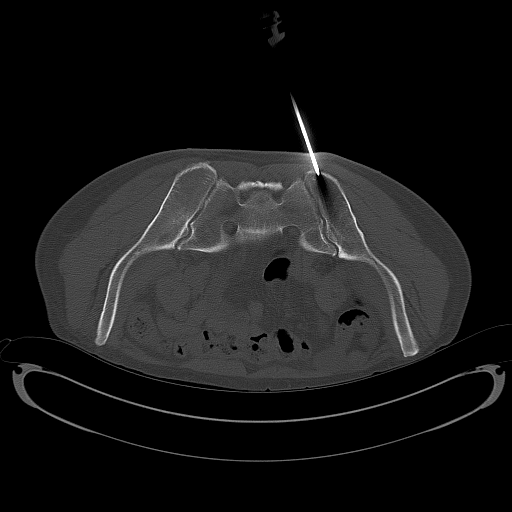
[im 3/4  soft-tissue]
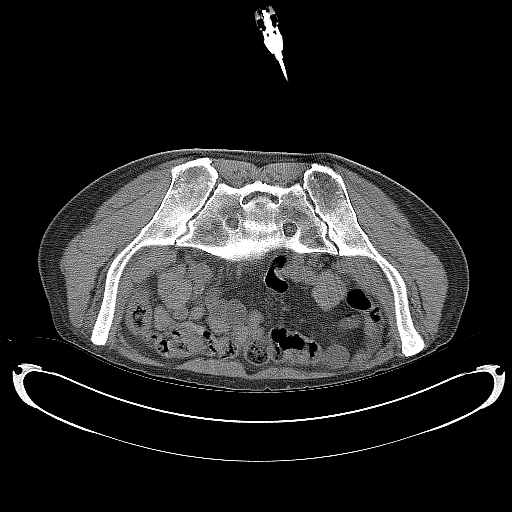
[im 3/4  lung]
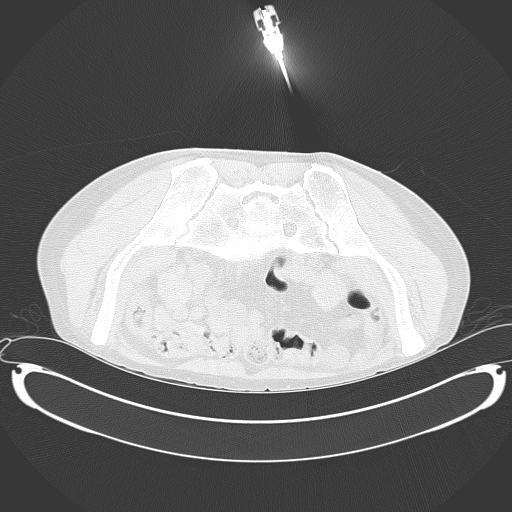

[2 of 6 positions shown; findings below may reference images not displayed]

PROCEDURE(S): CT GUIDED BONE MARROW ASPIRATE AND BIOPSY

Medications:Versed 2 mg, Fentanyl 50 mcg. A radiology nurse
monitored the patient for moderate sedation.

Moderate sedation time:15 minutes

Procedure:The procedure was explained to the patient.  The risks
and benefits of the procedure were discussed and the patient's
questions were addressed.  Informed consent was obtained from the
patient.  The patient was placed prone on the CT scanner.  Images
of the pelvis were obtained.  The back was prepped and draped in a
sterile fashion.  The skin and right posterior iliac bone were
anesthetized with 1% lidocaine.  11 gauge bone needle was directed
into the right posterior iliac bone with CT guidance.  Two
aspirates and two core biopsies were obtained with the 11 gauge
bone needle.  No immediate complication.
FINDINGS: Needle placement confirmed within the right posterior
iliac bone.

Complications: None
IMPRESSION: CT guided bone marrow aspirate and core biopsy.

## 2013-04-03 ENCOUNTER — Telehealth: Payer: Self-pay | Admitting: Cardiovascular Disease

## 2013-04-03 NOTE — Telephone Encounter (Signed)
Request for refill for amiodorone was faxed on 9/3; 9/4 still no ok from [place].

## 2013-04-03 NOTE — Telephone Encounter (Signed)
Faxed refilled to patient's pharmacy.

## 2013-04-19 ENCOUNTER — Ambulatory Visit (HOSPITAL_BASED_OUTPATIENT_CLINIC_OR_DEPARTMENT_OTHER): Payer: Medicare Other | Admitting: Oncology

## 2013-04-19 ENCOUNTER — Telehealth: Payer: Self-pay | Admitting: Oncology

## 2013-04-19 ENCOUNTER — Other Ambulatory Visit (HOSPITAL_BASED_OUTPATIENT_CLINIC_OR_DEPARTMENT_OTHER): Payer: Medicare Other | Admitting: Lab

## 2013-04-19 VITALS — BP 122/66 | HR 53 | Temp 96.8°F | Resp 19 | Ht 70.0 in | Wt 150.0 lb

## 2013-04-19 DIAGNOSIS — C8510 Unspecified B-cell lymphoma, unspecified site: Secondary | ICD-10-CM

## 2013-04-19 DIAGNOSIS — C8589 Other specified types of non-Hodgkin lymphoma, extranodal and solid organ sites: Secondary | ICD-10-CM

## 2013-04-19 DIAGNOSIS — N189 Chronic kidney disease, unspecified: Secondary | ICD-10-CM

## 2013-04-19 DIAGNOSIS — R32 Unspecified urinary incontinence: Secondary | ICD-10-CM

## 2013-04-19 DIAGNOSIS — Z23 Encounter for immunization: Secondary | ICD-10-CM

## 2013-04-19 DIAGNOSIS — D630 Anemia in neoplastic disease: Secondary | ICD-10-CM

## 2013-04-19 LAB — CBC WITH DIFFERENTIAL/PLATELET
BASO%: 0.5 % (ref 0.0–2.0)
LYMPH%: 26.2 % (ref 14.0–49.0)
MCH: 32.5 pg (ref 27.2–33.4)
MCHC: 32.9 g/dL (ref 32.0–36.0)
MONO#: 0.3 10*3/uL (ref 0.1–0.9)
NEUT#: 2.5 10*3/uL (ref 1.5–6.5)
Platelets: 162 10*3/uL (ref 140–400)
RBC: 3.54 10*6/uL — ABNORMAL LOW (ref 4.20–5.82)
RDW: 14 % (ref 11.0–14.6)
WBC: 4.1 10*3/uL (ref 4.0–10.3)
lymph#: 1.1 10*3/uL (ref 0.9–3.3)

## 2013-04-19 LAB — COMPREHENSIVE METABOLIC PANEL (CC13)
ALT: 12 U/L (ref 0–55)
AST: 13 U/L (ref 5–34)
Albumin: 3.9 g/dL (ref 3.5–5.0)
Calcium: 9 mg/dL (ref 8.4–10.4)
Chloride: 109 mEq/L (ref 98–109)
Creatinine: 1.3 mg/dL (ref 0.7–1.3)
Potassium: 4 mEq/L (ref 3.5–5.1)
Total Bilirubin: 1.04 mg/dL (ref 0.20–1.20)

## 2013-04-19 LAB — LACTATE DEHYDROGENASE (CC13): LDH: 152 U/L (ref 125–245)

## 2013-04-19 MED ORDER — INFLUENZA VAC SPLIT QUAD 0.5 ML IM SUSP
0.5000 mL | Freq: Once | INTRAMUSCULAR | Status: AC
  Administered 2013-04-19: 0.5 mL via INTRAMUSCULAR
  Filled 2013-04-19: qty 0.5

## 2013-04-19 NOTE — Telephone Encounter (Signed)
gv adn printed appt sched and avs forpt for April...pt request morning appt

## 2013-04-19 NOTE — Progress Notes (Signed)
   Thomas Doyle    OFFICE PROGRESS NOTE   INTERVAL HISTORY:   Thomas Doyle returns for scheduled followup of non-Hodgkin's lymphoma. He feels well. No fever, night sweats, or anorexia. No pain aside from burning at the end of urination. He complains of nocturia. He has been evaluated by urology here and to a scheduled appointment at [facility] for a second opinion. He is now living in his house and his daughters visit frequently.  Objective:  Vital signs in last 24 hours:  Blood pressure 122/66, pulse 53, temperature 96.8 F (36 C), temperature source Oral, resp. rate 19, height 5\' 10"  (1.778 m), weight 150 lb (68.04 kg).    HEENT: Neck without mass Lymphatics: No cervical, supraclavicular, axillary, or inguinal nodes Resp: Lungs clear bilaterally Cardio: Regular rate and rhythm GI: No hepatomegaly, no mass Vascular: No leg edema   Lab Results:  Lab Results  Component Value Date   WBC 4.1 04/19/2013   HGB 11.5* 04/19/2013   HCT 35.0* 04/19/2013   MCV 98.9* 04/19/2013   PLT 162 04/19/2013   ANC 2.5  BUN 19.8, creatinine 1.3, calcium 9.0, albumin 3.9, LDH 152  Medications: I have reviewed the patient's current medications.  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 He completed cycle 5 of CVP/rituximab on 04/13/2012. Restaging PET scan on 04/26/2012 with persistent mild heterogenous bone marrow uptake and no other evidence of active lymphoma. He completed cycle 6 of CVP/rituximab on 05/04/2012.  2. Hypercalcemia of malignancy-resolved following intravenous hydration, pamidronate, and chemotherapy  3. Anemia secondary to non-Hodgkin's lymphoma and chemotherapy-persistent mild anemia 4. Renal failure secondary to hypercalcemia and dehydration-improved  5. History of atrial fibrillation  6. COPD  7. Severe neutropenia following cycle 1 of CVP/rituximab-he received Neulasta beginning with  cycle 2 , severe neutropenia following cycle 3 (he did not receive Neulasta) .[name]  8. history of 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  9. Urinary incontinence/burning-? Etiology, now followed by urology   Disposition:  Thomas Doyle remains in clinical remission from non-Hodgkin's lymphoma. He received an influenza vaccine today. He will return for an office visit in 6 months. Thomas Doyle will contact us in the interim for new symptoms.   Thornton Papas, MD  04/19/2013  5:05 PM

## 2013-04-22 ENCOUNTER — Ambulatory Visit: Payer: Medicare Other | Admitting: Oncology

## 2013-05-09 ENCOUNTER — Ambulatory Visit: Payer: Medicare Other | Admitting: Internal Medicine

## 2013-05-28 ENCOUNTER — Ambulatory Visit (INDEPENDENT_AMBULATORY_CARE_PROVIDER_SITE_OTHER): Payer: Medicare Other | Admitting: Cardiovascular Disease

## 2013-05-28 ENCOUNTER — Encounter: Payer: Self-pay | Admitting: Cardiovascular Disease

## 2013-05-28 VITALS — BP 128/70 | HR 67 | Ht 71.0 in | Wt 155.2 lb

## 2013-05-28 DIAGNOSIS — C8589 Other specified types of non-Hodgkin lymphoma, extranodal and solid organ sites: Secondary | ICD-10-CM

## 2013-05-28 DIAGNOSIS — I10 Essential (primary) hypertension: Secondary | ICD-10-CM

## 2013-05-28 DIAGNOSIS — I4510 Unspecified right bundle-branch block: Secondary | ICD-10-CM

## 2013-05-28 DIAGNOSIS — C8510 Unspecified B-cell lymphoma, unspecified site: Secondary | ICD-10-CM

## 2013-05-28 DIAGNOSIS — I059 Rheumatic mitral valve disease, unspecified: Secondary | ICD-10-CM

## 2013-05-28 DIAGNOSIS — I4891 Unspecified atrial fibrillation: Secondary | ICD-10-CM

## 2013-05-28 DIAGNOSIS — I341 Nonrheumatic mitral (valve) prolapse: Secondary | ICD-10-CM

## 2013-05-28 DIAGNOSIS — I480 Paroxysmal atrial fibrillation: Secondary | ICD-10-CM

## 2013-05-28 NOTE — Patient Instructions (Signed)
Your physician recommends that you schedule a follow-up appointment in: 1 YEAR. No changes were made in your therapy today. 

## 2013-06-23 ENCOUNTER — Encounter: Payer: Self-pay | Admitting: Cardiovascular Disease

## 2013-06-23 DIAGNOSIS — I4510 Unspecified right bundle-branch block: Secondary | ICD-10-CM | POA: Insufficient documentation

## 2013-06-23 DIAGNOSIS — I341 Nonrheumatic mitral (valve) prolapse: Secondary | ICD-10-CM | POA: Insufficient documentation

## 2013-06-23 DIAGNOSIS — I480 Paroxysmal atrial fibrillation: Secondary | ICD-10-CM | POA: Insufficient documentation

## 2013-06-23 NOTE — Progress Notes (Signed)
Patient ID: Thomas Doyle, male   DOB: November 07, 1928, 79 y.o.   MRN: [id number]      HPI: Thomas Doyle is a 79 y.o. male who is a former patient of Dr. Alanda Amass who presents to the office today for establishment of cardiology care with me.  Mr. Taniguchi has a history of sick sinus syndrome with paroxysmal atrial fibrillation and has been on long-term amiodarone therapy. Remotely he had been on Coumadin.  He also has a history of hypertension, a history of intermittent right bundle branch block, as well as a history of non-Hodgkin's abdominal lymphoma. 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 Truett Perna.  In 2012, I performed cardiac catheterization on 07/03/2011. This revealed normal coronary arteries. He was angiographic evidence of mitral valve prolapse. He had a small abdominal aortic aneurysm.  Mr. Chauvin last saw Dr. Alanda Amass in April 2014. He denies recent episodes of chest pain. He presents for cardiology evaluation.  Past Medical History  Diagnosis Date  . Hypertension   . A-fib     On amiodarone, digoxin. Not on coumadin  . Anemia   . Pneumonia April 2013  . COPD (chronic obstructive pulmonary disease) dx April 2013  . Mitral valve prolapse   . Large B-cell lymphoma 11/2011    Past Surgical History  Procedure Laterality Date  . Cholecystectomy  1980's  . Chest tube insertion  1990's    fell  thru deck, punctured lung, chest tube  . Tonsillectomy    . Circumcision  age 32    No Known Allergies  Current Outpatient Prescriptions  Medication Sig Dispense Refill  . acetaminophen (TYLENOL) 650 MG CR tablet Take 650 mg by mouth every 8 (eight) hours as needed. As needed for right arm pain      . amiodarone (PACERONE) 200 MG tablet Take 200 mg by mouth daily.      [name] aspirin 81 MG tablet Take 81 mg by mouth daily.      . clopidogrel (PLAVIX) 75 MG tablet Take 75 mg by mouth  daily.      . digoxin (LANOXIN) 0.125 MG tablet Take 1 tablet (125 mcg total) by mouth daily.  30 tablet  6  . fish oil-omega-3 fatty acids 1000 MG capsule Take 1 g by mouth daily.      [name] losartan (COZAAR) 50 MG tablet Take 50 mg by mouth Daily.      [name] MYRBETRIQ 50 MG TB24 tablet Take 50 mg by mouth daily.      . polyethylene glycol (MIRALAX / GLYCOLAX) packet Take 17 g by mouth daily as needed.       . promethazine (PHENERGAN) 25 MG suppository Place 1 suppository (25 mg total) rectally every 8 (eight) hours as needed for nausea.  12 each  1  . tamsulosin (FLOMAX) 0.4 MG CAPS capsule Take 1 capsule by mouth at bedtime.       No current facility-administered medications for this visit.    History   Social History  . Marital Status: Married    Spouse Name: N/A    Number of Children: 2  . Years of Education: 14   Occupational History  . Retired from [employer]    Social History Main Topics  . Smoking status: Never Smoker   . Smokeless tobacco: Never Used  . Alcohol Use: No  . Drug Use: No  . Sexual Activity:  Not on file   Other Topics Concern  . Not on file   Social History Narrative   Widowed x 3 years.  Lives alone.  Independent of ADLs and ambulation.  Emergency Contact: Reinhold Rickey (Daugher): [phone]. Has two daughters. Still living at home, doesn't use cane/walker.              Family History  Problem Relation Age of Onset  . Cancer Father   . Heart attack Father   . Atrial fibrillation Mother     ROS is negative for fevers, chills or night sweats. He denies skin rash. He denies visual change. He denies cough or increased sputum production. He denies awareness of recurrent atrial fibrillation. There is no angina. There is no nausea or vomiting. He denies blood in stool or urine. He does have a history of enlarged prostate. He wakes up approximately 3 times per night to go to the bathroom. He status post remote cholecystectomy. He has not had known thyroid disease or  diabetes. He denies claudication he denies tremors. He denies psychologic changes.  Other comprehensive 12 point system review is negative.  PE BP 128/70  Pulse 67  Ht 5\' 11"  (1.803 m)  Wt 155 lb 3.2 oz (70.398 kg)  BMI 21.66 kg/m2  General: Alert, oriented, no distress.  Skin: normal turgor, no rashes HEENT: Normocephalic, atraumatic. Pupils round and reactive; sclera anicteric;no lid lag.  Nose without nasal septal hypertrophy Mouth/Parynx benign; Mallinpatti scale 2 Neck: No JVD, no carotid briuts Lungs: clear to ausculatation and percussion; no wheezing or rales Heart: RRR, s1 s2 normal split S1 and early systolic click. 2/6 systolic murmur radiating to the apex. No diastolic murmur. Abdomen: soft, nontender; no hepatosplenomehaly, BS+; abdominal aorta nontender and not dilated by palpation. Pulses 2+ Extremities: no clubbing cyanosis or edema, Homan's sign negative  Neurologic: grossly nonfocal Psychologic: normal affect and mood.  ECG: Sinus rhythm with PVC. Right bundle branch block with repolarization changes.  LABS:  BMET    Component Value Date/Time   NA 143 04/19/2013 1000   NA 138 02/03/2012 0834   K 4.0 04/19/2013 1000   K 4.0 02/03/2012 0834   CL 106 01/11/2013 0849   CL 103 02/03/2012 0834   CO2 24 04/19/2013 1000   CO2 26 02/03/2012 0834   GLUCOSE 80 04/19/2013 1000   GLUCOSE 93 01/11/2013 0849   GLUCOSE 81 02/03/2012 0834   BUN 19.8 04/19/2013 1000   BUN 23 02/03/2012 0834   CREATININE 1.3 04/19/2013 1000   CREATININE 1.25 02/03/2012 0834   CALCIUM 9.0 04/19/2013 1000   CALCIUM 9.5 02/03/2012 0834   CALCIUM 13.0* 12/09/2011 0430   GFRNONAA 23* 12/13/2011 0347   GFRAA 27* 12/13/2011 0347     Hepatic Function Panel     Component Value Date/Time   PROT 6.4 04/19/2013 1000   PROT 6.4 02/03/2012 0834   ALBUMIN 3.9 04/19/2013 1000   ALBUMIN 3.9 02/03/2012 0834   AST 13 04/19/2013 1000   AST 11 02/03/2012 0834   ALT 12 04/19/2013 1000   ALT 7 02/03/2012 0834   ALKPHOS 95  04/19/2013 1000   ALKPHOS 96 02/03/2012 0834   BILITOT 1.04 04/19/2013 1000   BILITOT 0.5 02/03/2012 0834     CBC    Component Value Date/Time   WBC 4.1 04/19/2013 1000   WBC 3.2* 12/13/2011 0347   RBC 3.54* 04/19/2013 1000   RBC 2.38* 12/13/2011 0347   RBC 2.66* 11/24/2011 0153   HGB 11.5* 04/19/2013  1000   HGB 7.4* 12/13/2011 0347   HCT 35.0* 04/19/2013 1000   HCT 22.0* 12/13/2011 0347   PLT 162 04/19/2013 1000   PLT 109* 12/13/2011 0347   MCV 98.9* 04/19/2013 1000   MCV 92.4 12/13/2011 0347   MCH 32.5 04/19/2013 1000   MCH 31.1 12/13/2011 0347   MCHC 32.9 04/19/2013 1000   MCHC 33.6 12/13/2011 0347   RDW 14.0 04/19/2013 1000   RDW 17.8* 12/13/2011 0347   LYMPHSABS 1.1 04/19/2013 1000   LYMPHSABS 1.1 12/13/2011 0347   MONOABS 0.3 04/19/2013 1000   MONOABS 0.2 12/13/2011 0347   EOSABS 0.2 04/19/2013 1000   EOSABS 0.6 12/13/2011 0347   BASOSABS 0.0 04/19/2013 1000   BASOSABS 0.0 12/13/2011 0347     BNP No results found for this basename: probnp    Lipid Panel  No results found for this basename: chol, trig, hdl, cholhdl, vldl, ldlcalc     RADIOLOGY: No results found.    ASSESSMENT AND PLAN: Mr. Lory Galan is an 79 year old gentleman underwent cardiac catheterization in 2012 which revealed normal coronary arteries, mitral valve prolapse, and evidence for small abdominal aortic aneurysm. He subsequently underwent duplex imaging of his workup in April 2014 showed normal taper without significant aneurysmal dilatation. His last echo Doppler study in September 2013 showed normal systolic function with ejection fraction greater than 55%. Borderline mitral valve problems as noted on echo with mild MR. He does have mild TR. There was mild aortic sclerosis with mild aortic insufficiency. From a cardiac standpoint, Mr. Sonneborn continues to do well on his current medical regimen. He sees Dr. Alcide Evener for oncology followup. As long as he remains stable, I will see him in one year for cardiology  reassessment and will be available if problems arise.     Lennette Bihari, MD, [facility]  06/23/2013 9:36 AM

## 2013-06-24 ENCOUNTER — Encounter: Payer: Self-pay | Admitting: Cardiovascular Disease

## 2013-08-23 ENCOUNTER — Other Ambulatory Visit: Payer: Self-pay | Admitting: Cardiovascular Disease

## 2013-08-26 NOTE — Telephone Encounter (Signed)
Rx was sent to pharmacy electronically. 

## 2013-10-15 ENCOUNTER — Telehealth: Payer: Self-pay | Admitting: Oncology

## 2013-10-15 NOTE — Telephone Encounter (Signed)
pt cld to cancel appt for 4/24 and will call back to resch

## 2013-11-01 ENCOUNTER — Other Ambulatory Visit: Payer: Medicare Other

## 2013-11-01 ENCOUNTER — Ambulatory Visit: Payer: Medicare Other | Admitting: Oncology

## 2013-11-15 ENCOUNTER — Ambulatory Visit: Payer: Medicare Other | Admitting: Nurse Practitioner

## 2013-11-15 ENCOUNTER — Other Ambulatory Visit: Payer: Medicare Other

## 2013-12-17 ENCOUNTER — Telehealth: Payer: Self-pay | Admitting: Cardiovascular Disease

## 2013-12-17 ENCOUNTER — Other Ambulatory Visit: Payer: Self-pay | Admitting: *Deleted

## 2013-12-17 MED ORDER — LOSARTAN POTASSIUM 100 MG PO TABS: 100.0000 mg | ORAL_TABLET | Freq: Every day | ORAL | Status: DC

## 2013-12-17 NOTE — Telephone Encounter (Signed)
Pt says he still have not gotten  His Losartan. Would you please write a prescription for Losartan 100 mg and leave at the front desk. Please call when it is ready.

## 2014-04-07 ENCOUNTER — Other Ambulatory Visit: Payer: Self-pay

## 2014-04-07 MED ORDER — AMIODARONE HCL 200 MG PO TABS: 200.0000 mg | ORAL_TABLET | Freq: Every day | ORAL | Status: DC

## 2014-04-07 NOTE — Telephone Encounter (Signed)
Rx was sent to pharmacy electronically. 

## 2014-04-14 ENCOUNTER — Telehealth: Payer: Self-pay | Admitting: Cardiovascular Disease

## 2014-04-14 MED ORDER — CLOPIDOGREL BISULFATE 75 MG PO TABS: 75.0000 mg | ORAL_TABLET | Freq: Every day | ORAL | Status: DC

## 2014-04-14 NOTE — Telephone Encounter (Signed)
Need a new prescription for Clopidogrel 75 Mg # 90 and refills. [phone].

## 2014-04-14 NOTE — Telephone Encounter (Signed)
Rx was sent to pharmacy electronically. 

## 2014-05-09 ENCOUNTER — Other Ambulatory Visit: Payer: Self-pay

## 2014-05-19 ENCOUNTER — Telehealth: Payer: Self-pay | Admitting: *Deleted

## 2014-05-19 DIAGNOSIS — C8510 Unspecified B-cell lymphoma, unspecified site: Secondary | ICD-10-CM

## 2014-05-19 NOTE — Telephone Encounter (Signed)
Having urinary problems-recurrent UTI's. Urologist asked him to find out what chemo drugs he had in past. Gave him the CHOP-Rituxan regimen. Last treatment was 05/04/2012. He also thinks he has missed his follow up appointment with Dr. Mort Sawyers he has. Would like to be seen before 2016 if possible. POF to scheduler.

## 2014-05-20 ENCOUNTER — Telehealth: Payer: Self-pay | Admitting: Oncology

## 2014-05-20 NOTE — Telephone Encounter (Signed)
s.w pt and advised on Dec appt......pt ok and aware °

## 2014-06-23 ENCOUNTER — Other Ambulatory Visit: Payer: Self-pay

## 2014-06-23 MED ORDER — DIGOXIN 125 MCG PO TABS: 125.0000 ug | ORAL_TABLET | Freq: Every day | ORAL | Status: DC

## 2014-06-23 NOTE — Telephone Encounter (Signed)
Rx sent to pharmacy   

## 2014-06-29 ENCOUNTER — Other Ambulatory Visit: Payer: Self-pay | Admitting: Cardiovascular Disease

## 2014-06-30 NOTE — Telephone Encounter (Signed)
Needs appointment before any more refills.  

## 2014-07-01 ENCOUNTER — Other Ambulatory Visit: Payer: Self-pay | Admitting: Cardiovascular Disease

## 2014-07-01 NOTE — Telephone Encounter (Signed)
Rx was sent to pharmacy electronically. 

## 2014-07-11 ENCOUNTER — Telehealth: Payer: Self-pay | Admitting: Oncology

## 2014-07-11 NOTE — Telephone Encounter (Signed)
pt called to cx 12.21 appt and r/s to March...no explanation....done...pt aware of new d.t in march 2016

## 2014-07-14 ENCOUNTER — Other Ambulatory Visit: Payer: Medicare Other

## 2014-07-14 ENCOUNTER — Ambulatory Visit: Payer: Medicare Other | Admitting: Nurse Practitioner

## 2014-07-22 ENCOUNTER — Other Ambulatory Visit: Payer: Self-pay | Admitting: Cardiovascular Disease

## 2014-07-22 NOTE — Telephone Encounter (Signed)
Rx(s) sent to pharmacy electronically.  

## 2014-07-27 ENCOUNTER — Other Ambulatory Visit: Payer: Self-pay | Admitting: Cardiovascular Disease

## 2014-07-28 ENCOUNTER — Other Ambulatory Visit (HOSPITAL_COMMUNITY): Payer: Self-pay | Admitting: Urology

## 2014-07-28 DIAGNOSIS — G8929 Other chronic pain: Secondary | ICD-10-CM

## 2014-07-28 DIAGNOSIS — R102 Pelvic and perineal pain: Secondary | ICD-10-CM

## 2014-07-31 ENCOUNTER — Encounter (HOSPITAL_COMMUNITY)
Admission: RE | Admit: 2014-07-31 | Discharge: 2014-07-31 | Disposition: A | Discharge status: Home / Self Care | Payer: Medicare Other | Source: Ambulatory Visit | Attending: Urology | Admitting: Urology

## 2014-07-31 ENCOUNTER — Ambulatory Visit (HOSPITAL_COMMUNITY)
Admission: RE | Admit: 2014-07-31 | Discharge: 2014-07-31 | Disposition: A | Discharge status: Home / Self Care | Payer: Medicare Other | Source: Ambulatory Visit | Attending: Urology | Admitting: Urology

## 2014-07-31 ENCOUNTER — Encounter (HOSPITAL_COMMUNITY): Payer: Medicare Other

## 2014-07-31 DIAGNOSIS — R102 Pelvic and perineal pain: Secondary | ICD-10-CM | POA: Insufficient documentation

## 2014-07-31 DIAGNOSIS — G8929 Other chronic pain: Secondary | ICD-10-CM | POA: Diagnosis not present

## 2014-07-31 DIAGNOSIS — K5730 Diverticulosis of large intestine without perforation or abscess without bleeding: Secondary | ICD-10-CM | POA: Diagnosis not present

## 2014-07-31 DIAGNOSIS — R300 Dysuria: Secondary | ICD-10-CM | POA: Insufficient documentation

## 2014-07-31 DIAGNOSIS — M5136 Other intervertebral disc degeneration, lumbar region: Secondary | ICD-10-CM | POA: Diagnosis not present

## 2014-07-31 DIAGNOSIS — N400 Enlarged prostate without lower urinary tract symptoms: Secondary | ICD-10-CM | POA: Diagnosis not present

## 2014-07-31 MED ORDER — TECHNETIUM TC 99M MEDRONATE IV KIT
25.0000 | PACK | Freq: Once | INTRAVENOUS | Status: AC | PRN
  Administered 2014-07-31: 25 via INTRAVENOUS

## 2014-08-01 NOTE — Telephone Encounter (Signed)
Spoke with patient to make appointment for refills. Patient made appointment for 09/12/14 with [facility] and Rx refill was sent to pharmacy

## 2014-08-06 ENCOUNTER — Other Ambulatory Visit: Payer: Self-pay | Admitting: Cardiovascular Disease

## 2014-08-06 NOTE — Telephone Encounter (Signed)
Rx has been sent to the pharmacy electronically. ° °

## 2014-09-03 ENCOUNTER — Other Ambulatory Visit: Payer: Self-pay | Admitting: Cardiovascular Disease

## 2014-09-03 NOTE — Telephone Encounter (Signed)
Rx refill sent to patient pharmacy   

## 2014-09-12 ENCOUNTER — Ambulatory Visit (INDEPENDENT_AMBULATORY_CARE_PROVIDER_SITE_OTHER): Payer: Medicare Other | Admitting: Cardiovascular Disease

## 2014-09-12 VITALS — BP 146/78 | HR 75 | Ht 70.0 in | Wt 152.0 lb

## 2014-09-12 DIAGNOSIS — I4891 Unspecified atrial fibrillation: Secondary | ICD-10-CM

## 2014-09-12 DIAGNOSIS — I493 Ventricular premature depolarization: Secondary | ICD-10-CM

## 2014-09-12 DIAGNOSIS — I10 Essential (primary) hypertension: Secondary | ICD-10-CM

## 2014-09-12 DIAGNOSIS — C8510 Unspecified B-cell lymphoma, unspecified site: Secondary | ICD-10-CM

## 2014-09-12 DIAGNOSIS — Z79899 Other long term (current) drug therapy: Secondary | ICD-10-CM

## 2014-09-12 DIAGNOSIS — I480 Paroxysmal atrial fibrillation: Secondary | ICD-10-CM

## 2014-09-12 LAB — CBC
HCT: 36.1 % — ABNORMAL LOW (ref 39.0–52.0)
HEMOGLOBIN: 12.1 g/dL — AB (ref 13.0–17.0)
MCH: 32.6 pg (ref 26.0–34.0)
MCHC: 33.5 g/dL (ref 30.0–36.0)
MCV: 97.3 fL (ref 78.0–100.0)
MPV: 9.1 fL (ref 8.6–12.4)
Platelets: 218 10*3/uL (ref 150–400)
RBC: 3.71 MIL/uL — AB (ref 4.22–5.81)
RDW: 14.3 % (ref 11.5–15.5)
WBC: 5.3 10*3/uL (ref 4.0–10.5)

## 2014-09-12 MED ORDER — METOPROLOL SUCCINATE ER 25 MG PO TB24: 25.0000 mg | ORAL_TABLET | Freq: Every day | ORAL | Status: DC

## 2014-09-12 NOTE — Patient Instructions (Signed)
Your physician recommends that you return for lab work TODAY.  Your physician has recommended you make the following change in your medication: start new metoprolol succ 25 mg prescription. This has already been sent to your pharmacy.  Your physician wants you to follow-up in: 6 months or sooner if needed. You will receive a reminder letter in the mail two months in advance. If you don't receive a letter, please call our office to schedule the follow-up appointment.

## 2014-09-13 LAB — COMPREHENSIVE METABOLIC PANEL WITH GFR
ALT: 10 U/L (ref 0–53)
AST: 10 U/L (ref 0–37)
Albumin: 4.5 g/dL (ref 3.5–5.2)
Alkaline Phosphatase: 83 U/L (ref 39–117)
BUN: 16 mg/dL (ref 6–23)
CO2: 27 meq/L (ref 19–32)
Calcium: 9.3 mg/dL (ref 8.4–10.5)
Chloride: 105 meq/L (ref 96–112)
Creat: 1.09 mg/dL (ref 0.50–1.35)
Glucose, Bld: 95 mg/dL (ref 70–99)
Potassium: 4 meq/L (ref 3.5–5.3)
Sodium: 140 meq/L (ref 135–145)
Total Bilirubin: 0.7 mg/dL (ref 0.2–1.2)
Total Protein: 6.5 g/dL (ref 6.0–8.3)

## 2014-09-13 LAB — TSH: TSH: 4.076 u[IU]/mL (ref 0.350–4.500)

## 2014-09-13 LAB — DIGOXIN LEVEL: Digoxin Level: 1 ng/mL (ref 0.8–2.0)

## 2014-09-14 ENCOUNTER — Encounter: Payer: Self-pay | Admitting: Cardiovascular Disease

## 2014-09-14 DIAGNOSIS — I493 Ventricular premature depolarization: Secondary | ICD-10-CM | POA: Insufficient documentation

## 2014-09-14 NOTE — Progress Notes (Signed)
Patient ID: Thomas Doyle, male   DOB: July 09, 1929, 81 y.o.   MRN: [id number]      HPI: Thomas Doyle is a 81 y.o. male who is a former patient of Thomas Doyle who established cardiology care with me in November 2014.  He now presents for follow-up evaluation.    [name] has a history of sick sinus syndrome with paroxysmal atrial fibrillation and has been on long-term amiodarone therapy. Remotely he had been on Coumadin.  He also has a history of hypertension, a history of intermittent right bundle branch block, as well as a history of non-Hodgkin's abdominal lymphoma. 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 Benay Spice.  In 2012, I performed cardiac catheterization on 07/03/2011. This revealed normal coronary arteries. He was angiographic evidence of mitral valve prolapse. He had a small abdominal aortic aneurysm.   He has a history of hypertension and has been on losartan 100 mg.  He continues to be on amiodarone 20 mg daily as well as dual antiplatelet therapy with aspirin and Plavix.  He does note occasional extra beats.  Since I last saw him, he states his lymphoma has progressed and is Stage 4. He has stopped treatment.  He has made himself a DO NOT RESUSCITATE.  His wife is passed away.  He believes that he has less than 6 months to year to live.  He denies any chest pain.  He denies presyncope or syncope.  He denies significant swelling.  He has made arrangements to donate his complete body to [facility] upon his passing.   Past Medical History  Diagnosis Date  . Hypertension   . Anemia   . Pneumonia April 2013  . COPD (chronic obstructive pulmonary disease) dx April 2013  . Mitral valve prolapse   . Large B-cell lymphoma 11/2011  . Sick sinus syndrome   . PAF (paroxysmal atrial fibrillation)     On amiodarone, digoxin. Not on coumadin  . Right bundle branch block (RBBB)   .  Non Hodgkin's lymphoma   . AAA (abdominal aortic aneurysm)     intrarenal    Past Surgical History  Procedure Laterality Date  . Cholecystectomy  1980's  . Chest tube insertion  1990's    fell  thru deck, punctured lung, chest tube  . Tonsillectomy    . Circumcision  age 53  . Abdominal aorta aneurysm duplex  11/20/2012    normal - demonstrates normal taper  . Cardiac catheterization  09/02/2010    normal LV function, normal coronary arteries, irregularity of the distal aorta with a small aneursymal dilatation  . Cardiovascular stress test  08/20/2010    evidence of mild-moderate ischemia in Basal inferoseptal, Basal inferior, Mid inferoseptal and Mid inferior regions. EKG negative for ischemia. Patient developed RBBB during exercise and transient ventricular bigeminal rhythm  . Doppler echocardiography  04/05/2012    EF 63%, borderline MVP with mild mitral regurg    No Known Allergies  Current Outpatient Prescriptions  Medication Sig Dispense Refill  . amiodarone (PACERONE) 200 MG tablet Take 1 tablet (200 mg total) by mouth daily. 30 tablet 1  . aspirin 81 MG tablet Take 81 mg by mouth daily.    . clopidogrel (PLAVIX) 75 MG tablet Take 1 tablet (75 mg total) by mouth daily. <PLEASE MAKE APPOINTMENT FOR REFILLS> 90 tablet 0  . digoxin (LANOXIN) 0.125 MG tablet Take 1  tablet (0.125 mg total) by mouth daily. 30 tablet 0  . fish oil-omega-3 fatty acids 1000 MG capsule Take 1 g by mouth daily.    [name] losartan (COZAAR) 100 MG tablet Take 1 tablet (100 mg total) by mouth daily. 90 tablet 3  . meloxicam (MOBIC) 7.5 MG tablet Take 1 tablet by mouth daily.  11  . MYRBETRIQ 50 MG TB24 tablet Take 50 mg by mouth daily.    . nitrofurantoin (MACRODANTIN) 100 MG capsule Take 100 mg by mouth 2 (two) times daily.  6  . polyethylene glycol (MIRALAX / GLYCOLAX) packet Take 17 g by mouth daily as needed.     . tamsulosin (FLOMAX) 0.4 MG CAPS capsule Take 1 capsule by mouth at bedtime.    . traMADol  (ULTRAM) 50 MG tablet Take by mouth every 6 (six) hours as needed.    . metoprolol succinate (TOPROL XL) 25 MG 24 hr tablet Take 1 tablet (25 mg total) by mouth daily. 30 tablet 6   No current facility-administered medications for this visit.    History   Social History  . Marital Status: Widowed    Spouse Name: N/A  . Number of Children: 2  . Years of Education: 14   Occupational History  . Retired from [employer]  . Smoking status: Never Smoker   . Smokeless tobacco: Never Used  . Alcohol Use: No  . Drug Use: No  . Sexual Activity: Not on file   Other Topics Concern  . Not on file   Social History Narrative   Widowed x 3 years.  Lives alone.  Independent of ADLs and ambulation.  Emergency Contact: Jaydyn Bozzo ([place]): [phone]. Has two daughters. Still living at home, doesn't use cane/walker.              Family History  Problem Relation Age of Onset  . Cancer Father   . Heart attack Father   . Atrial fibrillation Mother    ROS General: Negative; No fevers, chills, or night sweats;  HEENT: Negative; No changes in vision or hearing, sinus congestion, difficulty swallowing Pulmonary: Negative; No cough, wheezing, shortness of breath, hemoptysis Cardiovascular: Negative; No chest pain, presyncope, syncope, palpitations GI: Negative; No nausea, vomiting, diarrhea, or abdominal pain GU: Positive for prostatic hypertrophy Musculoskeletal: Negative; no myalgias, joint pain, or weakness Hematologic/Oncology: Positive for non-Hodgkin's lymphoma stage IV Endocrine: Negative; no heat/cold intolerance; no diabetes Neuro: Negative; no changes in balance, headaches Skin: Negative; No rashes or skin lesions Psychiatric: Negative; No behavioral problems, depression Sleep: Negative; No snoring, daytime sleepiness, hypersomnolence, bruxism, restless legs, hypnogognic hallucinations, no cataplexy Other comprehensive 14 point system review is  negative.   PE BP 146/78 mmHg  Pulse 75  Ht $R'5\' 10"'cK$  (1.778 m)  Wt 152 lb (68.947 kg)  BMI 21.81 kg/m2  General: Alert, oriented, no distress.  Skin: normal turgor, no rashes HEENT: Normocephalic, atraumatic. Pupils round and reactive; sclera anicteric;no lid lag.  Nose without nasal septal hypertrophy Mouth/Parynx benign; Mallinpatti scale 2 Neck: No JVD, no carotid bruits with normal carotid upstroke. Lungs: clear to ausculatation and percussion; no wheezing or rales Chest wall: Nontender to palpation Heart: Regular rhythm with occasional ectopy;, s1 s2 normal split S1 and early systolic click. 2/6 systolic murmur radiating to the apex. No diastolic murmur. Abdomen: soft, nontender; no hepatosplenomehaly, BS+; abdominal aorta nontender and not dilated by palpation. Back: No CVA tenderness Pulses 2+ Extremities: no clubbing cyanosis or edema, Homan's sign  negative  Neurologic: grossly nonfocal Psychologic: normal affect and mood.  ECG (independently read by me): Sinus rhythm at 75 bpm, PVCs with transient ventricular trigeminy  ECG: Sinus rhythm with PVC. Right bundle branch block with repolarization changes.  LABS:  BMET  BMP Latest Ref Rng 09/12/2014 04/19/2013 01/11/2013  Glucose 70 - 99 mg/dL 95 80 93  BUN 6 - 23 mg/dL 16 19.8 24.2  Creatinine 0.50 - 1.35 mg/dL 1.09 1.3 1.3  Sodium 135 - 145 mEq/L 140 143 139  Potassium 3.5 - 5.3 mEq/L 4.0 4.0 4.2  Chloride 96 - 112 mEq/L 105 - 106  CO2 19 - 32 mEq/L $Remove'27 24 25  'AizGlnp$ Calcium 8.4 - 10.5 mg/dL 9.3 9.0 9.1     Hepatic Function Panel   Hepatic Function Latest Ref Rng 09/12/2014 04/19/2013 01/11/2013  Total Protein 6.0 - 8.3 g/dL 6.5 6.4 6.2(L)  Albumin 3.5 - 5.2 g/dL 4.5 3.9 3.9  AST 0 - 37 U/L $Remo'10 13 13  'mgKcZ$ ALT 0 - 53 U/L $Remo'10 12 9  'ZjVBs$ Alk Phosphatase 39 - 117 U/L 83 95 104  Total Bilirubin 0.2 - 1.2 mg/dL 0.7 1.04 0.93     CBC  CBC Latest Ref Rng 09/12/2014 04/19/2013 01/11/2013  WBC 4.0 - 10.5 K/uL 5.3 4.1 6.2  Hemoglobin 13.0 -  17.0 g/dL 12.1(L) 11.5(L) 11.8(L)  Hematocrit 39.0 - 52.0 % 36.1(L) 35.0(L) 33.9(L)  Platelets 150 - 400 K/uL 218 162 150     BNP No results found for: PROBNP  Lipid Panel  No results found for: CHOL   RADIOLOGY: No results found.    ASSESSMENT AND PLAN: Mr. Shandell Jallow is an 81 year old gentleman underwent cardiac catheterization in 2012 which revealed normal coronary arteries, mitral valve prolapse, and evidence for small abdominal aortic aneurysm. He subsequently underwent duplex imaging of his workup in April 2014 showed normal taper without significant aneurysmal dilatation. His last echo Doppler study in September 2013 showed normal systolic function with ejection fraction greater than 55%. Borderline mitral valve problems as noted on echo with mild MR. He does have mild TR. There was mild aortic sclerosis with mild aortic insufficiency. Mr. Cruces has a history of PAF for which she has been on amiodarone therapy long-term.  He is maintaining sinus rhythm.  He does have PVCs on exam with transient ventricular trigeminy noted on ECG.  I recommended the addition of Toprol-XL 25 mg to his medical regimen.  I will be checking laboratory today following his office visit.  We had a long discussion concerning his desires and wishes and his rationale for with holding therapy for his stage IV non-Hodgkin's lymphoma. I will schedule him a follow-up appointment in 6 months.    Troy Sine, MD, [facility]  09/14/2014 10:08 AM

## 2014-09-18 ENCOUNTER — Telehealth: Payer: Self-pay | Admitting: *Deleted

## 2014-09-18 NOTE — Telephone Encounter (Signed)
Mailed handicap sticker application signed by Dr. Claiborne Billings to patient.

## 2014-09-26 ENCOUNTER — Other Ambulatory Visit: Payer: Self-pay | Admitting: Cardiovascular Disease

## 2014-09-26 NOTE — Telephone Encounter (Signed)
Rx(s) sent to pharmacy electronically.  

## 2014-09-29 ENCOUNTER — Other Ambulatory Visit: Payer: Self-pay

## 2014-09-29 MED ORDER — LOSARTAN POTASSIUM 100 MG PO TABS: 100.0000 mg | ORAL_TABLET | Freq: Every day | ORAL | Status: DC

## 2014-09-29 NOTE — Telephone Encounter (Signed)
Rx(s) sent to pharmacy electronically.  

## 2014-09-30 ENCOUNTER — Other Ambulatory Visit: Payer: Self-pay | Admitting: Cardiovascular Disease

## 2014-10-08 ENCOUNTER — Other Ambulatory Visit: Payer: Self-pay | Admitting: Cardiovascular Disease

## 2014-10-13 ENCOUNTER — Ambulatory Visit: Payer: Medicare Other | Admitting: Oncology

## 2014-10-13 ENCOUNTER — Other Ambulatory Visit: Payer: Medicare Other

## 2015-01-19 ENCOUNTER — Other Ambulatory Visit: Payer: Self-pay

## 2015-03-19 ENCOUNTER — Ambulatory Visit: Payer: Self-pay | Admitting: Oncology

## 2015-03-19 ENCOUNTER — Other Ambulatory Visit: Payer: Medicare Other

## 2015-03-20 ENCOUNTER — Other Ambulatory Visit: Payer: Self-pay | Admitting: Cardiovascular Disease

## 2015-03-20 NOTE — Telephone Encounter (Signed)
Rx(s) sent to pharmacy electronically.  

## 2015-04-20 ENCOUNTER — Other Ambulatory Visit: Payer: Self-pay | Admitting: Cardiovascular Disease

## 2015-04-20 NOTE — Telephone Encounter (Signed)
Rx request sent to pharmacy.  

## 2015-04-30 ENCOUNTER — Other Ambulatory Visit: Payer: Self-pay | Admitting: Cardiovascular Disease

## 2015-04-30 NOTE — Telephone Encounter (Signed)
Rx request sent to pharmacy.  

## 2015-05-18 ENCOUNTER — Other Ambulatory Visit: Payer: Self-pay | Admitting: Cardiovascular Disease

## 2015-05-30 ENCOUNTER — Other Ambulatory Visit: Payer: Self-pay | Admitting: Cardiovascular Disease

## 2015-06-05 ENCOUNTER — Telehealth: Payer: Self-pay | Admitting: Oncology

## 2015-06-05 NOTE — Telephone Encounter (Signed)
Returned patient call re r/s 11/11 appointments to end of March. Gave patient new appointment for 3/30. Left message for desk nurse yesterday re patient moving appointment to March.

## 2015-06-08 ENCOUNTER — Other Ambulatory Visit: Payer: Medicare Other

## 2015-06-08 ENCOUNTER — Ambulatory Visit: Payer: Self-pay | Admitting: Oncology

## 2015-06-16 ENCOUNTER — Other Ambulatory Visit: Payer: Self-pay | Admitting: Cardiovascular Disease

## 2015-07-01 ENCOUNTER — Other Ambulatory Visit: Payer: Self-pay | Admitting: Cardiovascular Disease

## 2015-09-11 ENCOUNTER — Other Ambulatory Visit: Payer: Self-pay | Admitting: Cardiovascular Disease

## 2015-09-11 NOTE — Telephone Encounter (Signed)
Rx(s) sent to pharmacy electronically.  

## 2015-10-05 ENCOUNTER — Telehealth: Payer: Self-pay | Admitting: Oncology

## 2015-10-05 NOTE — Telephone Encounter (Signed)
Called patient re changing 3/30 lab/fu to 3/27 due to call day. Per patient cx appointment for now due to some other things he has going on and he will call back to reschedule as he cannot come in March.

## 2015-10-22 ENCOUNTER — Other Ambulatory Visit: Payer: Medicare Other

## 2015-10-22 ENCOUNTER — Ambulatory Visit: Payer: Self-pay | Admitting: Oncology

## 2015-10-26 ENCOUNTER — Other Ambulatory Visit: Payer: Self-pay | Admitting: Cardiovascular Disease

## 2015-10-27 ENCOUNTER — Other Ambulatory Visit: Payer: Self-pay | Admitting: Cardiovascular Disease

## 2015-10-28 NOTE — Telephone Encounter (Signed)
Losartan refilled 10/27/15

## 2015-11-12 ENCOUNTER — Other Ambulatory Visit: Payer: Self-pay | Admitting: Cardiovascular Disease

## 2015-11-13 NOTE — Telephone Encounter (Signed)
REFILL 

## 2015-11-14 ENCOUNTER — Other Ambulatory Visit: Payer: Self-pay | Admitting: Cardiovascular Disease

## 2015-11-16 NOTE — Telephone Encounter (Signed)
Rx(s) sent to pharmacy electronically.  

## 2015-12-10 ENCOUNTER — Other Ambulatory Visit: Payer: Self-pay | Admitting: Cardiovascular Disease

## 2015-12-10 NOTE — Telephone Encounter (Signed)
Rx request sent to pharmacy.  

## 2015-12-23 ENCOUNTER — Other Ambulatory Visit: Payer: Self-pay | Admitting: Cardiovascular Disease

## 2015-12-24 NOTE — Telephone Encounter (Signed)
Rx(s) sent to pharmacy electronically.  

## 2016-01-09 ENCOUNTER — Other Ambulatory Visit: Payer: Self-pay | Admitting: Cardiovascular Disease

## 2016-01-11 NOTE — Telephone Encounter (Signed)
Rx(s) sent to pharmacy electronically.  

## 2016-01-17 ENCOUNTER — Other Ambulatory Visit: Payer: Self-pay | Admitting: Cardiovascular Disease

## 2016-01-18 NOTE — Telephone Encounter (Signed)
Rx(s) sent to pharmacy electronically.  

## 2016-02-01 ENCOUNTER — Other Ambulatory Visit: Payer: Self-pay | Admitting: Cardiovascular Disease

## 2016-02-01 NOTE — Telephone Encounter (Signed)
REFILL 

## 2016-03-30 ENCOUNTER — Encounter: Payer: Self-pay | Admitting: Cardiovascular Disease

## 2016-03-30 ENCOUNTER — Ambulatory Visit (INDEPENDENT_AMBULATORY_CARE_PROVIDER_SITE_OTHER): Payer: Medicare Other | Admitting: Cardiovascular Disease

## 2016-03-30 VITALS — BP 162/72 | HR 49 | Ht 70.0 in | Wt 156.6 lb

## 2016-03-30 DIAGNOSIS — I10 Essential (primary) hypertension: Secondary | ICD-10-CM

## 2016-03-30 DIAGNOSIS — I4510 Unspecified right bundle-branch block: Secondary | ICD-10-CM

## 2016-03-30 DIAGNOSIS — C8510 Unspecified B-cell lymphoma, unspecified site: Secondary | ICD-10-CM | POA: Diagnosis not present

## 2016-03-30 DIAGNOSIS — I4891 Unspecified atrial fibrillation: Secondary | ICD-10-CM | POA: Diagnosis not present

## 2016-03-30 DIAGNOSIS — I480 Paroxysmal atrial fibrillation: Secondary | ICD-10-CM | POA: Diagnosis not present

## 2016-03-30 DIAGNOSIS — R001 Bradycardia, unspecified: Secondary | ICD-10-CM

## 2016-03-30 MED ORDER — AMLODIPINE BESYLATE 5 MG PO TABS: 5.0000 mg | ORAL_TABLET | Freq: Every day | ORAL | 11 refills | Status: DC

## 2016-03-30 NOTE — Patient Instructions (Signed)
Your physician has recommended you make the following change in your medication:   1.) STOP the digoxin.  2.) start new prescription for amlodipine 5 mg. A prescription has been sent to your  Pharmacy.   Your physician recommends that you schedule a follow-up appointment in: 3 months.

## 2016-04-01 NOTE — Progress Notes (Signed)
Patient ID: Thomas Doyle, male   DOB: [birth year], 82 y.o.   MRN: [id number]      HPI: Thomas Doyle is a 82 y.o. male who is a former patient of Dr. Rollene Fare who established cardiology care with me in November 2014.  He now presents for a 7 month follow-up evaluation.    Thomas Doyle has a history of sick sinus syndrome with paroxysmal atrial fibrillation and has been on long-term amiodarone therapy. Thomas Doyle he had been on Coumadin.  He also has a history of hypertension, a history of intermittent right bundle branch block, as well as a history of non-Hodgkin's abdominal lymphoma. 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 Benay Spice.  In 2012, I performed cardiac catheterization on 07/03/2011. This revealed normal coronary arteries. He was angiographic evidence of mitral valve prolapse. He had a small abdominal aortic aneurysm.   He has a history of hypertension and has been on losartan 100 mg.  He continues to be on amiodarone 20 mg daily as well as dual antiplatelet therapy with aspirin and Plavix.  He does note occasional extra beats.  His lymphoma has progressed and is Stage 4. He has stopped treatment.  He has made himself a DO NOT RESUSCITATE.  His wife is passed away.  When I saw him he believed that he had less than 6 months to year to live.  Over the past 6 months, he has noticed gradually that he is getting weaker.  He lives with his daughter.  He denies any chest pain.  He has donated his body when he dies to science.  He is no longer following up with his hematology doctor.  He presents for cardiology follow-up evaluation.   Past Medical History:  Diagnosis Date  . AAA (abdominal aortic aneurysm) (HCC)    intrarenal  . Anemia   . COPD (chronic obstructive pulmonary disease) [facility]) dx April 2013  . Hypertension   . Large B-cell lymphoma ([place]) 11/2011  . Mitral valve prolapse     . Non Hodgkin's lymphoma ([place])   . PAF (paroxysmal atrial fibrillation) (HCC)    On amiodarone, digoxin. Not on coumadin  . Pneumonia April 2013  . Right bundle branch block (RBBB)   . Sick sinus syndrome [facility])     Past Surgical History:  Procedure Laterality Date  . abdominal aorta aneurysm duplex  11/20/2012   normal - demonstrates normal taper  . CARDIAC CATHETERIZATION  09/02/2010   normal LV function, normal coronary arteries, irregularity of the distal aorta with a small aneursymal dilatation  . CARDIOVASCULAR STRESS TEST  08/20/2010   evidence of mild-moderate ischemia in Basal inferoseptal, Basal inferior, Mid inferoseptal and Mid inferior regions. EKG negative for ischemia. Patient developed RBBB during exercise and transient ventricular bigeminal rhythm  . CHEST TUBE INSERTION  1990's   fell  thru deck, punctured lung, chest tube  . CHOLECYSTECTOMY  1980's  . CIRCUMCISION  age 32  . DOPPLER ECHOCARDIOGRAPHY  04/05/2012   EF 63%, borderline MVP with mild mitral regurg  . TONSILLECTOMY      No Known Allergies  Current Outpatient Prescriptions  Medication Sig Dispense Refill  . amiodarone (PACERONE) 200 MG tablet Take 1 tablet (200 mg total) by mouth daily. KEEP OV. 30 tablet 1  . aspirin 81 MG tablet Take 81 mg by mouth daily.    . clopidogrel (PLAVIX) 75 MG tablet  Take 1 tablet (75 mg total) by mouth daily. 30 tablet 6  . fish oil-omega-3 fatty acids 1000 MG capsule Take 1 g by mouth daily.    [name] losartan (COZAAR) 100 MG tablet Take 1 tablet (100 mg total) by mouth daily. KEEP OV. 30 tablet 1  . metoprolol succinate (TOPROL-XL) 25 MG 24 hr tablet Take 1 tablet (25 mg total) by mouth daily. PLEASE KEEP UPCOMING APPOINTMENT . 30 tablet 2  . vitamin B-12 (CYANOCOBALAMIN) 1000 MCG tablet Take 1,000 mcg by mouth daily.    [name] amLODipine (NORVASC) 5 MG tablet Take 1 tablet (5 mg total) by mouth daily. 30 tablet 11   No current facility-administered medications for this visit.      Social History   Social History  . Marital status: Widowed    Spouse name: N/A  . Number of children: 2  . Years of education: 14   Occupational History  . Retired from [employer]  . Smoking status: Never Smoker  . Smokeless tobacco: Never Used  . Alcohol use No  . Drug use: No  . Sexual activity: Not on file   Other Topics Concern  . Not on file   Social History Narrative   Widowed x 3 years.  Lives alone.  Independent of ADLs and ambulation.  Emergency Contact: Anel Purohit ([place]): [phone]. Has two daughters. Still living at home, doesn't use cane/walker.              Family History  Problem Relation Age of Onset  . Cancer Father   . Heart attack Father   . Atrial fibrillation Mother    ROS General: Negative; No fevers, chills, or night sweats;  HEENT: Negative; No changes in vision or hearing, sinus congestion, difficulty swallowing Pulmonary: Negative; No cough, wheezing, shortness of breath, hemoptysis Cardiovascular: Negative; No chest pain, presyncope, syncope, palpitations GI: Negative; No nausea, vomiting, diarrhea, or abdominal pain GU: Positive for prostatic hypertrophy Musculoskeletal: Negative; no myalgias, joint pain, or weakness Hematologic/Oncology: Positive for non-Hodgkin's lymphoma stage IV Endocrine: Negative; no heat/cold intolerance; no diabetes Neuro: Negative; no changes in balance, headaches Skin: Negative; No rashes or skin lesions Psychiatric: Negative; No behavioral problems, depression Sleep: Negative; No snoring, daytime sleepiness, hypersomnolence, bruxism, restless legs, hypnogognic hallucinations, no cataplexy Other comprehensive 14 point system review is negative.   PE BP (!) 162/72   Pulse (!) 49   Ht '5\' 10"'$  (1.778 m)   Wt 156 lb 9.6 oz (71 kg)   BMI 22.47 kg/m    Repeat blood pressure is 178/78 when taken by me.  Wt Readings from Last 3 Encounters:  03/30/16 156 lb 9.6 oz (71 kg)   09/12/14 152 lb (68.9 kg)  05/28/13 155 lb 3.2 oz (70.4 kg)   General: Alert, oriented, no distress.  Skin: normal turgor, no rashes HEENT: Normocephalic, atraumatic. Pupils round and reactive; sclera anicteric;no lid lag.  Nose without nasal septal hypertrophy Mouth/Parynx benign; Mallinpatti scale 2 Neck: No JVD, no carotid bruits with normal carotid upstroke. Lungs: clear to ausculatation and percussion; no wheezing or rales Chest wall: Nontender to palpation Heart: Regular rhythm with occasional ectopy; s1 s2 normal split S1 and early systolic click. 2/6 systolic murmur radiating to the apex. No diastolic murmur. Abdomen: soft, nontender; no hepatosplenomehaly, BS+; abdominal aorta nontender and not dilated by palpation. Back: No CVA tenderness Pulses 2+ Extremities: no clubbing cyanosis or edema, Homan's sign negative  Neurologic: grossly nonfocal Psychologic: normal affect and  mood.  ECG (independently read by me): Ms. bradycardia at 49 bpm.  Right bundle-branch block and probable left anterior hemiblock.  February 2019 ECG (independently read by me): Sinus rhythm at 75 bpm, PVCs with transient ventricular trigeminy  ECG: Sinus rhythm with PVC. Right bundle branch block with repolarization changes.  LABS:  BMP Latest Ref Rng & Units 09/12/2014 04/19/2013 01/11/2013  Glucose 70 - 99 mg/dL 95 80 93  BUN 6 - 23 mg/dL 16 19.8 24.2  Creatinine 0.50 - 1.35 mg/dL 1.09 1.3 1.3  Sodium 135 - 145 mEq/L 140 143 139  Potassium 3.5 - 5.3 mEq/L 4.0 4.0 4.2  Chloride 96 - 112 mEq/L 105 - 106  CO2 19 - 32 mEq/L '27 24 25  '$ Calcium 8.4 - 10.5 mg/dL 9.3 9.0 9.1    Hepatic Function Latest Ref Rng & Units 09/12/2014 04/19/2013 01/11/2013  Total Protein 6.0 - 8.3 g/dL 6.5 6.4 6.2(L)  Albumin 3.5 - 5.2 g/dL 4.5 3.9 3.9  AST 0 - 37 U/L '10 13 13  '$ ALT 0 - 53 U/L '10 12 9  '$ Alk Phosphatase 39 - 117 U/L 83 95 104  Total Bilirubin 0.2 - 1.2 mg/dL 0.7 1.04 0.93    CBC Latest Ref Rng & Units 09/12/2014  04/19/2013 01/11/2013  WBC 4.0 - 10.5 K/uL 5.3 4.1 6.2  Hemoglobin 13.0 - 17.0 g/dL 12.1(L) 11.5(L) 11.8(L)  Hematocrit 39.0 - 52.0 % 36.1(L) 35.0(L) 33.9(L)  Platelets 150 - 400 K/uL 218 162 150    Lab Results  Component Value Date   MCV 97.3 09/12/2014   MCV 98.9 (H) 04/19/2013   MCV 95.2 01/11/2013    BNP No results found for: PROBNP  Lipid Panel  No results found for: CHOL   RADIOLOGY: No results found.    ASSESSMENT AND PLAN: Mr. Bayley Hurn is an 82 year old gentleman underwent cardiac catheterization in 2012 which revealed normal coronary arteries, mitral valve prolapse, and evidence for small abdominal aortic aneurysm. He subsequently underwent duplex imaging of his workup in April 2014 showed normal taper without significant aneurysmal dilatation. His last echo Doppler study in September 2013 showed normal systolic function with ejection fraction greater than 55%. Borderline mitral valve problems as noted on echo with mild MR. He does have mild TR. There was mild aortic sclerosis with mild aortic insufficiency. Mr. Elahi has a history of PAF for which she has been on amiodarone therapy long-term.  He is maintaining sinus rhythm on amiodarone 200 mg daily, digoxin 0.125 mg, Toprol-XL 25 mg daily, and also takes losartan 100 mg daily for hypertension.  He is bradycardic today.  I am discontinuing digoxin.  He is hypertensive.  I am adding amlodipine 5 mg to his medical regimen.  If he remains severely bradycardic dose reduction of either amiodarone or metoprolol may be necessary.  He continues to be on aspirin and Plavix.  I will see him in 3 months for reevaluation.   Troy Sine, MD, [facility]  04/01/2016 7:14 PM

## 2016-04-04 ENCOUNTER — Other Ambulatory Visit: Payer: Self-pay | Admitting: Cardiovascular Disease

## 2016-04-15 ENCOUNTER — Other Ambulatory Visit: Payer: Self-pay | Admitting: Cardiovascular Disease

## 2016-04-16 DIAGNOSIS — Z23 Encounter for immunization: Secondary | ICD-10-CM | POA: Diagnosis not present

## 2016-04-19 NOTE — Telephone Encounter (Signed)
Rx(s) sent to pharmacy electronically.  

## 2016-04-27 ENCOUNTER — Other Ambulatory Visit: Payer: Self-pay

## 2016-04-27 MED ORDER — METOPROLOL SUCCINATE ER 25 MG PO TB24: 25.0000 mg | ORAL_TABLET | Freq: Every day | ORAL | 2 refills | Status: DC

## 2016-05-16 DIAGNOSIS — N411 Chronic prostatitis: Secondary | ICD-10-CM | POA: Diagnosis not present

## 2016-05-16 DIAGNOSIS — G8929 Other chronic pain: Secondary | ICD-10-CM | POA: Diagnosis not present

## 2016-05-16 DIAGNOSIS — R102 Pelvic and perineal pain: Secondary | ICD-10-CM | POA: Diagnosis not present

## 2016-05-16 DIAGNOSIS — L309 Dermatitis, unspecified: Secondary | ICD-10-CM | POA: Diagnosis not present

## 2016-06-29 ENCOUNTER — Ambulatory Visit (INDEPENDENT_AMBULATORY_CARE_PROVIDER_SITE_OTHER): Payer: Medicare Other | Admitting: Cardiovascular Disease

## 2016-06-29 VITALS — BP 110/74 | HR 80 | Ht 70.0 in | Wt 165.0 lb

## 2016-06-29 DIAGNOSIS — I480 Paroxysmal atrial fibrillation: Secondary | ICD-10-CM | POA: Diagnosis not present

## 2016-06-29 DIAGNOSIS — C8510 Unspecified B-cell lymphoma, unspecified site: Secondary | ICD-10-CM | POA: Diagnosis not present

## 2016-06-29 DIAGNOSIS — I10 Essential (primary) hypertension: Secondary | ICD-10-CM | POA: Diagnosis not present

## 2016-06-29 DIAGNOSIS — I4510 Unspecified right bundle-branch block: Secondary | ICD-10-CM

## 2016-06-29 MED ORDER — AMLODIPINE BESYLATE 5 MG PO TABS: 5.0000 mg | ORAL_TABLET | Freq: Every day | ORAL | 4 refills | Status: DC

## 2016-06-29 MED ORDER — AMIODARONE HCL 200 MG PO TABS: 300.0000 mg | ORAL_TABLET | Freq: Every day | ORAL | 10 refills | Status: DC

## 2016-06-29 MED ORDER — CLOPIDOGREL BISULFATE 75 MG PO TABS: 75.0000 mg | ORAL_TABLET | Freq: Every day | ORAL | 4 refills | Status: DC

## 2016-06-29 MED ORDER — METOPROLOL SUCCINATE ER 25 MG PO TB24: 25.0000 mg | ORAL_TABLET | Freq: Every day | ORAL | 4 refills | Status: DC

## 2016-06-29 MED ORDER — LOSARTAN POTASSIUM 100 MG PO TABS
100.0000 mg | ORAL_TABLET | Freq: Every day | ORAL | 4 refills | Status: DC
Start: 2016-06-29 — End: 2016-12-29

## 2016-06-29 NOTE — Patient Instructions (Signed)
Your physician has recommended you make the following change in your medication:   1.) THE AMIODARONE HAS BEEN INCREASED TO 300 MG DAILY. ( 1.5 tablets)  Your physician recommends that you schedule a follow-up appointment in: 3-4 months.

## 2016-07-01 ENCOUNTER — Encounter: Payer: Self-pay | Admitting: Cardiovascular Disease

## 2016-07-01 NOTE — Progress Notes (Signed)
Patient ID: Thomas Doyle, male   DOB: [birth year], 82 y.o.   MRN: [id number]      HPI: Thomas Doyle is a 82 y.o. male who is a former patient of Dr. Rollene Fare who established cardiology care with me in November 2014.  He now presents for a 3  month follow-up evaluation.    [name] has a history of sick sinus syndrome with paroxysmal atrial fibrillation and has been on long-term amiodarone therapy. Remotely he had been on Coumadin.  He also has a history of hypertension, a history of intermittent right bundle branch block, as well as a history of non-Hodgkin's abdominal lymphoma. 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 Benay Spice.  In 2012, I performed cardiac catheterization on 07/03/2011. This revealed normal coronary arteries. He was angiographic evidence of mitral valve prolapse. He had a small abdominal aortic aneurysm.   He has a history of hypertension and has been on losartan 100 mg.  He continues to be on amiodarone 20 mg daily as well as dual antiplatelet therapy with aspirin and Plavix.  He does note occasional extra beats.  His lymphoma has progressed and is Stage 4. He has stopped treatment.  He has made himself a DO NOT RESUSCITATE.  His wife is passed away.  When I saw him he believed that he had less than 6 months to year to live.  Over the past 6 months, he has noticed gradually that he is getting weaker.  He lives with his daughter.  He denies any chest pain.  He has donated his body when he dies to science.  He is no longer following up with his hematology doctor.    WhenI last saw him, he was hypertensive and was maintaining sinus rhythm on amiodarone 20 mg, digoxin 0.125 mg, Toprol-XL 2g, and losartan 100 mg. He was bradycardic.  I discontinued digoxin and added amlodipine 5 mg to his medical regimen.he has felt well.  He continues to be asymptomatic.  He is waiting to  die.  He does feel more weak.  Per his request, he has not had any oncologic follow-up evaluation.  He presents for follow-up cardiology evaluation.  Past Medical History:  Diagnosis Date  . AAA (abdominal aortic aneurysm) (HCC)    intrarenal  . Anemia   . COPD (chronic obstructive pulmonary disease) [facility]) dx April 2013  . Hypertension   . Large B-cell lymphoma ([place]) 11/2011  . Mitral valve prolapse   . Non Hodgkin's lymphoma ([place])   . PAF (paroxysmal atrial fibrillation) (HCC)    On amiodarone, digoxin. Not on coumadin  . Pneumonia April 2013  . Right bundle branch block (RBBB)   . Sick sinus syndrome [facility])     Past Surgical History:  Procedure Laterality Date  . abdominal aorta aneurysm duplex  11/20/2012   normal - demonstrates normal taper  . CARDIAC CATHETERIZATION  09/02/2010   normal LV function, normal coronary arteries, irregularity of the distal aorta with a small aneursymal dilatation  . CARDIOVASCULAR STRESS TEST  08/20/2010   evidence of mild-moderate ischemia in Basal inferoseptal, Basal inferior, Mid inferoseptal and Mid inferior regions. EKG negative for ischemia. Patient developed RBBB during exercise and transient ventricular bigeminal rhythm  . CHEST TUBE INSERTION  1990's   fell  thru deck, punctured lung, chest tube  . CHOLECYSTECTOMY  1980's  . CIRCUMCISION  age 22  .  DOPPLER ECHOCARDIOGRAPHY  04/05/2012   EF 63%, borderline MVP with mild mitral regurg  . TONSILLECTOMY      No Known Allergies  Current Outpatient Prescriptions  Medication Sig Dispense Refill  . amiodarone (PACERONE) 200 MG tablet Take 1.5 tablets (300 mg total) by mouth daily. 45 tablet 10  . aspirin 81 MG tablet Take 81 mg by mouth daily.    . clopidogrel (PLAVIX) 75 MG tablet Take 1 tablet (75 mg total) by mouth daily. 90 tablet 4  . fish oil-omega-3 fatty acids 1000 MG capsule Take 1 g by mouth daily.    [name] losartan (COZAAR) 100 MG tablet Take 1 tablet (100 mg total) by mouth daily. 90  tablet 4  . metoprolol succinate (TOPROL-XL) 25 MG 24 hr tablet Take 1 tablet (25 mg total) by mouth daily. 90 tablet 4  . tamsulosin (FLOMAX) 0.4 MG CAPS capsule Take 1 capsule by mouth daily.  2  . vitamin B-12 (CYANOCOBALAMIN) 1000 MCG tablet Take 1,000 mcg by mouth daily.    [name] amLODipine (NORVASC) 5 MG tablet Take 1 tablet (5 mg total) by mouth daily. 90 tablet 4   No current facility-administered medications for this visit.     Social History   Social History  . Marital status: Widowed    Spouse name: N/A  . Number of children: 2  . Years of education: 14   Occupational History  . Retired from [employer]  . Smoking status: Never Smoker  . Smokeless tobacco: Never Used  . Alcohol use No  . Drug use: No  . Sexual activity: Not on file   Other Topics Concern  . Not on file   Social History Narrative   Widowed x 3 years.  Lives alone.  Independent of ADLs and ambulation.  Emergency Contact: Chun Sellen ([place]): [phone]. Has two daughters. Still living at home, doesn't use cane/walker.              Family History  Problem Relation Age of Onset  . Atrial fibrillation Mother   . Cancer Father   . Heart attack Father    ROS General: Negative; No fevers, chills, or night sweats;  HEENT: Negative; No changes in vision or hearing, sinus congestion, difficulty swallowing Pulmonary: Negative; No cough, wheezing, shortness of breath, hemoptysis Cardiovascular: Negative; No chest pain, presyncope, syncope, palpitations GI: Negative; No nausea, vomiting, diarrhea, or abdominal pain GU: Positive for prostatic hypertrophy Musculoskeletal: Negative; no myalgias, joint pain, or weakness Hematologic/Oncology: Positive for non-Hodgkin's lymphoma stage IV Endocrine: Negative; no heat/cold intolerance; no diabetes Neuro: Negative; no changes in balance, headaches Skin: Negative; No rashes or skin lesions Psychiatric: Negative; No behavioral problems,  depression Sleep: Negative; No snoring, daytime sleepiness, hypersomnolence, bruxism, restless legs, hypnogognic hallucinations, no cataplexy Other comprehensive 14 point system review is negative.   PE BP 110/74 (BP Location: Left Arm, Patient Position: Sitting, Cuff Size: Normal)   Pulse 80   Ht '5\' 10"'$  (1.778 m)   Wt 165 lb (74.8 kg)   BMI 23.68 kg/m    Repeat blood pressure is 118/80 when taken by me.  Wt Readings from Last 3 Encounters:  06/29/16 165 lb (74.8 kg)  03/30/16 156 lb 9.6 oz (71 kg)  09/12/14 152 lb (68.9 kg)   General: Alert, oriented, no distress.  Skin: normal turgor, no rashes HEENT: Normocephalic, atraumatic. Pupils round and reactive; sclera anicteric;no lid lag.  Nose without nasal septal hypertrophy Mouth/Parynx benign; Mallinpatti scale 2  Neck: No JVD, no carotid bruits with normal carotid upstroke. Lungs: clear to ausculatation and percussion; no wheezing or rales Chest wall: Nontender to palpation Heart: Irregularly irregular hythm with occasional ectopy; s1 s2 normal split S1 and early systolic click. 2/6 systolic murmur radiating to the apex. No diastolic murmur. Abdomen: soft, nontender; no hepatosplenomehaly, BS+; abdominal aorta nontender and not dilated by palpation. Back: No CVA tenderness Pulses 2+ Extremities: no clubbing cyanosis or edema, Homan's sign negative  Neurologic: grossly nonfocal Psychologic: normal affect and mood.  ECG (independently read by me): trial fibrillation with ventricular rate at 80.  Right bundle branch block.  September 2017 ECG (independently read by me): Ms. bradycardia at 49 bpm.  Right bundle-branch block and probable left anterior hemiblock.  February 2019 ECG (independently read by me): Sinus rhythm at 75 bpm, PVCs with transient ventricular trigeminy  ECG: Sinus rhythm with PVC. Right bundle branch block with repolarization changes.  LABS:  BMP Latest Ref Rng & Units 09/12/2014 04/19/2013 01/11/2013    Glucose 70 - 99 mg/dL 95 80 93  BUN 6 - 23 mg/dL 16 19.8 24.2  Creatinine 0.50 - 1.35 mg/dL 1.09 1.3 1.3  Sodium 135 - 145 mEq/L 140 143 139  Potassium 3.5 - 5.3 mEq/L 4.0 4.0 4.2  Chloride 96 - 112 mEq/L 105 - 106  CO2 19 - 32 mEq/L '27 24 25  '$ Calcium 8.4 - 10.5 mg/dL 9.3 9.0 9.1    Hepatic Function Latest Ref Rng & Units 09/12/2014 04/19/2013 01/11/2013  Total Protein 6.0 - 8.3 g/dL 6.5 6.4 6.2(L)  Albumin 3.5 - 5.2 g/dL 4.5 3.9 3.9  AST 0 - 37 U/L '10 13 13  '$ ALT 0 - 53 U/L '10 12 9  '$ Alk Phosphatase 39 - 117 U/L 83 95 104  Total Bilirubin 0.2 - 1.2 mg/dL 0.7 1.04 0.93    CBC Latest Ref Rng & Units 09/12/2014 04/19/2013 01/11/2013  WBC 4.0 - 10.5 K/uL 5.3 4.1 6.2  Hemoglobin 13.0 - 17.0 g/dL 12.1(L) 11.5(L) 11.8(L)  Hematocrit 39.0 - 52.0 % 36.1(L) 35.0(L) 33.9(L)  Platelets 150 - 400 K/uL 218 162 150    Lab Results  Component Value Date   MCV 97.3 09/12/2014   MCV 98.9 (H) 04/19/2013   MCV 95.2 01/11/2013    BNP No results found for: PROBNP  Lipid Panel  No results found for: CHOL   RADIOLOGY: No results found.    ASSESSMENT AND PLAN: Mr. Drake Wuertz is an 82 year old gentleman underwent cardiac catheterization in 2012 which revealed normal coronary arteries, mitral valve prolapse, and evidence for small abdominal aortic aneurysm. He subsequently underwent duplex imaging of his workup in April 2014 showed normal taper without significant aneurysmal dilatation. His last echo Doppler study in September 2013 showed normal systolic function with ejection fraction greater than 55%; borderline mitral valve prolapse with mild MR. He does have mild TR. There was mild aortic sclerosis with mild aortic insufficiency. He has a history of PAF and when I last saw him, he was bradycardic and I really discontinued his digoxin.  His blood pressure today is significantly improved with the addition of amlodipine to his medical regimen.  His ECG today, however, shows that he is back in atrial  fibrillation.  He is completely unaware of this.  He does not want to initiate anticoagulation therapy.  He is maintained on dual antiplatelet therapy with aspirin and Plavix.  He has agreed for a slight titration of amiodarone to 300 mg daily to see if  he can revert to sinus rhythm.  He is aware of stroke risk without fulldose anticoagulation.  He is anticipating going to "medical school "after he donates his body to medical science upon his death.  He has s agreed to return for follow-up office visit in 4-6 months. Troy Sine, MD, [facility]  07/01/2016 10:05 PM

## 2016-07-13 DIAGNOSIS — H5203 Hypermetropia, bilateral: Secondary | ICD-10-CM | POA: Diagnosis not present

## 2016-08-05 ENCOUNTER — Other Ambulatory Visit: Payer: Self-pay | Admitting: Nurse Practitioner

## 2016-12-27 ENCOUNTER — Inpatient Hospital Stay (HOSPITAL_COMMUNITY)
Admission: EM | Admit: 2016-12-27 | Discharge: 2016-12-29 | DRG: 194 | Disposition: A | Discharge status: Home / Self Care | Payer: Medicare Other | Attending: Internal Medicine | Admitting: Internal Medicine

## 2016-12-27 ENCOUNTER — Emergency Department (HOSPITAL_COMMUNITY): Payer: Medicare Other

## 2016-12-27 ENCOUNTER — Encounter (HOSPITAL_COMMUNITY): Payer: Self-pay

## 2016-12-27 DIAGNOSIS — N183 Chronic kidney disease, stage 3 (moderate): Secondary | ICD-10-CM | POA: Diagnosis present

## 2016-12-27 DIAGNOSIS — Z7982 Long term (current) use of aspirin: Secondary | ICD-10-CM

## 2016-12-27 DIAGNOSIS — E860 Dehydration: Secondary | ICD-10-CM | POA: Diagnosis present

## 2016-12-27 DIAGNOSIS — R05 Cough: Secondary | ICD-10-CM | POA: Diagnosis not present

## 2016-12-27 DIAGNOSIS — D631 Anemia in chronic kidney disease: Secondary | ICD-10-CM | POA: Diagnosis present

## 2016-12-27 DIAGNOSIS — I480 Paroxysmal atrial fibrillation: Secondary | ICD-10-CM | POA: Diagnosis present

## 2016-12-27 DIAGNOSIS — Z23 Encounter for immunization: Secondary | ICD-10-CM | POA: Diagnosis not present

## 2016-12-27 DIAGNOSIS — J181 Lobar pneumonia, unspecified organism: Secondary | ICD-10-CM | POA: Diagnosis not present

## 2016-12-27 DIAGNOSIS — I129 Hypertensive chronic kidney disease with stage 1 through stage 4 chronic kidney disease, or unspecified chronic kidney disease: Secondary | ICD-10-CM | POA: Diagnosis not present

## 2016-12-27 DIAGNOSIS — J440 Chronic obstructive pulmonary disease with acute lower respiratory infection: Secondary | ICD-10-CM | POA: Diagnosis present

## 2016-12-27 DIAGNOSIS — Z7902 Long term (current) use of antithrombotics/antiplatelets: Secondary | ICD-10-CM

## 2016-12-27 DIAGNOSIS — D649 Anemia, unspecified: Secondary | ICD-10-CM | POA: Diagnosis not present

## 2016-12-27 DIAGNOSIS — E871 Hypo-osmolality and hyponatremia: Secondary | ICD-10-CM | POA: Diagnosis present

## 2016-12-27 DIAGNOSIS — R296 Repeated falls: Secondary | ICD-10-CM | POA: Diagnosis present

## 2016-12-27 DIAGNOSIS — I10 Essential (primary) hypertension: Secondary | ICD-10-CM | POA: Diagnosis not present

## 2016-12-27 DIAGNOSIS — R509 Fever, unspecified: Secondary | ICD-10-CM | POA: Diagnosis not present

## 2016-12-27 DIAGNOSIS — C8510 Unspecified B-cell lymphoma, unspecified site: Secondary | ICD-10-CM | POA: Diagnosis present

## 2016-12-27 DIAGNOSIS — Z8249 Family history of ischemic heart disease and other diseases of the circulatory system: Secondary | ICD-10-CM | POA: Diagnosis not present

## 2016-12-27 DIAGNOSIS — Z79899 Other long term (current) drug therapy: Secondary | ICD-10-CM | POA: Diagnosis not present

## 2016-12-27 DIAGNOSIS — Z66 Do not resuscitate: Secondary | ICD-10-CM | POA: Diagnosis present

## 2016-12-27 DIAGNOSIS — J189 Pneumonia, unspecified organism: Secondary | ICD-10-CM | POA: Diagnosis not present

## 2016-12-27 DIAGNOSIS — J449 Chronic obstructive pulmonary disease, unspecified: Secondary | ICD-10-CM | POA: Diagnosis not present

## 2016-12-27 DIAGNOSIS — R1110 Vomiting, unspecified: Secondary | ICD-10-CM | POA: Diagnosis not present

## 2016-12-27 LAB — COMPREHENSIVE METABOLIC PANEL
ALT: 31 U/L (ref 17–63)
AST: 23 U/L (ref 15–41)
Albumin: 4 g/dL (ref 3.5–5.0)
Alkaline Phosphatase: 89 U/L (ref 38–126)
Anion gap: 8 (ref 5–15)
BUN: 21 mg/dL — ABNORMAL HIGH (ref 6–20)
CHLORIDE: 103 mmol/L (ref 101–111)
CO2: 22 mmol/L (ref 22–32)
CREATININE: 1.4 mg/dL — AB (ref 0.61–1.24)
Calcium: 8.6 mg/dL — ABNORMAL LOW (ref 8.9–10.3)
GFR calc Af Amer: 51 mL/min — ABNORMAL LOW (ref >60)
GFR, EST NON AFRICAN AMERICAN: 44 mL/min — AB (ref >60)
Glucose, Bld: 129 mg/dL — ABNORMAL HIGH (ref 65–99)
POTASSIUM: 4 mmol/L (ref 3.5–5.1)
Sodium: 133 mmol/L — ABNORMAL LOW (ref 135–145)
Total Bilirubin: 0.9 mg/dL (ref 0.3–1.2)
Total Protein: 6.2 g/dL — ABNORMAL LOW (ref 6.5–8.1)

## 2016-12-27 LAB — CBC
HEMATOCRIT: 34.6 % — AB (ref 39.0–52.0)
Hemoglobin: 11.4 g/dL — ABNORMAL LOW (ref 13.0–17.0)
MCH: 31.5 pg (ref 26.0–34.0)
MCHC: 32.9 g/dL (ref 30.0–36.0)
MCV: 95.6 fL (ref 78.0–100.0)
PLATELETS: 186 10*3/uL (ref 150–400)
RBC: 3.62 MIL/uL — ABNORMAL LOW (ref 4.22–5.81)
RDW: 14.1 % (ref 11.5–15.5)
WBC: 11.3 10*3/uL — AB (ref 4.0–10.5)

## 2016-12-27 LAB — URINALYSIS, ROUTINE W REFLEX MICROSCOPIC
Bilirubin Urine: NEGATIVE
GLUCOSE, UA: NEGATIVE mg/dL
Hgb urine dipstick: NEGATIVE
Ketones, ur: 20 mg/dL — AB
LEUKOCYTES UA: NEGATIVE
NITRITE: NEGATIVE
PROTEIN: NEGATIVE mg/dL
Specific Gravity, Urine: 1.018 (ref 1.005–1.030)
pH: 5 (ref 5.0–8.0)

## 2016-12-27 LAB — DIGOXIN LEVEL

## 2016-12-27 LAB — I-STAT CG4 LACTIC ACID, ED: Lactic Acid, Venous: 1.95 mmol/L (ref 0.5–1.9)

## 2016-12-27 MED ORDER — ONDANSETRON HCL 4 MG/2ML IJ SOLN: 4.0000 mg | Freq: Four times a day (QID) | INTRAMUSCULAR | Status: DC | PRN

## 2016-12-27 MED ORDER — CLOPIDOGREL BISULFATE 75 MG PO TABS
75.0000 mg | ORAL_TABLET | Freq: Every day | ORAL | Status: DC
  Administered 2016-12-28 – 2016-12-29 (×2): 75 mg via ORAL
  Filled 2016-12-27 (×2): qty 1

## 2016-12-27 MED ORDER — ACETAMINOPHEN 325 MG PO TABS
650.0000 mg | ORAL_TABLET | Freq: Once | ORAL | Status: AC
  Administered 2016-12-27: 650 mg via ORAL
  Filled 2016-12-27: qty 2

## 2016-12-27 MED ORDER — TAMSULOSIN HCL 0.4 MG PO CAPS
0.4000 mg | ORAL_CAPSULE | Freq: Every day | ORAL | Status: DC
  Administered 2016-12-28 – 2016-12-29 (×2): 0.4 mg via ORAL
  Filled 2016-12-27 (×2): qty 1

## 2016-12-27 MED ORDER — NON FORMULARY: 3.0000 mg | Freq: Every evening | Status: DC | PRN

## 2016-12-27 MED ORDER — ZOLPIDEM TARTRATE 5 MG PO TABS
5.0000 mg | ORAL_TABLET | Freq: Every evening | ORAL | Status: DC | PRN
  Administered 2016-12-27 – 2016-12-28 (×2): 5 mg via ORAL
  Filled 2016-12-27 (×2): qty 1

## 2016-12-27 MED ORDER — DIPHENHYDRAMINE HCL 25 MG PO CAPS: 25.0000 mg | ORAL_CAPSULE | Freq: Every evening | ORAL | Status: DC | PRN

## 2016-12-27 MED ORDER — AZITHROMYCIN 500 MG IV SOLR
500.0000 mg | INTRAVENOUS | Status: DC
  Administered 2016-12-28: 500 mg via INTRAVENOUS
  Filled 2016-12-27 (×2): qty 500

## 2016-12-27 MED ORDER — ASPIRIN 81 MG PO CHEW
81.0000 mg | CHEWABLE_TABLET | Freq: Every day | ORAL | Status: DC
  Administered 2016-12-28 – 2016-12-29 (×2): 81 mg via ORAL
  Filled 2016-12-27 (×2): qty 1

## 2016-12-27 MED ORDER — GUAIFENESIN ER 600 MG PO TB12
600.0000 mg | ORAL_TABLET | Freq: Two times a day (BID) | ORAL | Status: DC
  Administered 2016-12-27 – 2016-12-29 (×4): 600 mg via ORAL
  Filled 2016-12-27 (×4): qty 1

## 2016-12-27 MED ORDER — ENOXAPARIN SODIUM 30 MG/0.3ML ~~LOC~~ SOLN
30.0000 mg | Freq: Every day | SUBCUTANEOUS | Status: DC
  Administered 2016-12-27: 30 mg via SUBCUTANEOUS
  Filled 2016-12-27: qty 0.3

## 2016-12-27 MED ORDER — SODIUM CHLORIDE 0.9 % IV SOLN
INTRAVENOUS | Status: AC
  Administered 2016-12-28: 01:00:00 via INTRAVENOUS

## 2016-12-27 MED ORDER — MELATONIN 3 MG PO TABS
3.0000 mg | ORAL_TABLET | Freq: Every evening | ORAL | Status: DC | PRN
  Filled 2016-12-27: qty 2

## 2016-12-27 MED ORDER — AZITHROMYCIN 500 MG IV SOLR
500.0000 mg | Freq: Once | INTRAVENOUS | Status: AC
  Administered 2016-12-27: 500 mg via INTRAVENOUS
  Filled 2016-12-27: qty 500

## 2016-12-27 MED ORDER — ACETAMINOPHEN 650 MG RE SUPP
650.0000 mg | Freq: Four times a day (QID) | RECTAL | Status: DC | PRN
Start: 2016-12-27 — End: 2016-12-29

## 2016-12-27 MED ORDER — METOPROLOL SUCCINATE ER 25 MG PO TB24
25.0000 mg | ORAL_TABLET | Freq: Every day | ORAL | Status: DC
  Administered 2016-12-28: 25 mg via ORAL
  Filled 2016-12-27: qty 1

## 2016-12-27 MED ORDER — SODIUM CHLORIDE 0.9 % IV BOLUS (SEPSIS)
1000.0000 mL | Freq: Once | INTRAVENOUS | Status: AC
  Administered 2016-12-27: 1000 mL via INTRAVENOUS

## 2016-12-27 MED ORDER — ACETAMINOPHEN 325 MG PO TABS: 650.0000 mg | ORAL_TABLET | Freq: Four times a day (QID) | ORAL | Status: DC | PRN

## 2016-12-27 MED ORDER — ONDANSETRON HCL 4 MG PO TABS: 4.0000 mg | ORAL_TABLET | Freq: Four times a day (QID) | ORAL | Status: DC | PRN

## 2016-12-27 MED ORDER — ONDANSETRON HCL 4 MG/2ML IJ SOLN
4.0000 mg | Freq: Once | INTRAMUSCULAR | Status: DC | PRN
  Filled 2016-12-27: qty 2

## 2016-12-27 MED ORDER — DEXTROSE 5 % IV SOLN
1.0000 g | Freq: Once | INTRAVENOUS | Status: AC
  Administered 2016-12-27: 1 g via INTRAVENOUS
  Filled 2016-12-27: qty 10

## 2016-12-27 MED ORDER — CEFTRIAXONE SODIUM 1 G IJ SOLR
1.0000 g | INTRAMUSCULAR | Status: DC
  Administered 2016-12-28: 1 g via INTRAVENOUS
  Filled 2016-12-27: qty 10

## 2016-12-27 MED ORDER — AMIODARONE HCL 200 MG PO TABS
300.0000 mg | ORAL_TABLET | Freq: Every day | ORAL | Status: DC
  Administered 2016-12-28 – 2016-12-29 (×2): 300 mg via ORAL
  Filled 2016-12-27 (×2): qty 2

## 2016-12-27 NOTE — ED Notes (Signed)
Attempted report x 2 

## 2016-12-27 NOTE — ED Notes (Signed)
Donald Prose, RN accepts report at this time.

## 2016-12-27 NOTE — H&P (Signed)
History and Physical  Patient Name: Thomas Doyle     XQJ:[id number]    DOB: 02/21/1929    DOA: 12/27/2016 PCP: Patient, No Pcp Per   Patient coming from: Urgent Care  Chief Complaint: Cough, weakness, falls, shakes  HPI: Thomas Doyle is a 83 y.o. male with a past medical history significant for NHL stage IV, no longer on treatment, pAF on amiodarone, no anticoagulation, and HTN who presents with cough and fever.  The patient lives at home, his daughter stays with him, he is ambulatory with a cane or walker, but relatively functional at baseline.    Over the last 3 weeks, the patient has had slow and progressive onset of weakness and cough. This cough is productive of thick white and brown mucus, nasal congestion, and has been slowly progressively worsening. Over the last 3 days he has had several falls, then today he had shakes and fevers. Urgent care. At urgent care he was sent to the emergency room.  ED course: -Temp 102.59F, heart rate 72, respirations 25, blood pressure 142/102, pulse oximetry 95% on room air -Na 133, K 4.0, Cr 1.4 (baseline 1.0-1.4), WBC 11.3K, Hgb 11.4 -Lactate 1.95 -CXR showed LLL opacity consistent with pneumonia -He was given ceftriaxone and azithromycin and TRH were asked to evaluate for CAP  The patient used to follow with Dr. Ammie Dalton for his NHL, but appears to have progressed on treatment and has discontinued therapies.  His wife died around 1 year ago.     ROS: Review of Systems  Constitutional: Positive for chills, fever and malaise/fatigue.  HENT: Positive for congestion.   Respiratory: Positive for cough and sputum production.   Gastrointestinal: Positive for nausea and vomiting.  Genitourinary: Negative for dysuria, frequency, hematuria and urgency.  Musculoskeletal: Positive for falls.  All other systems reviewed and are negative.         Past Medical History:  Diagnosis Date  . AAA (abdominal aortic aneurysm) (HCC)    intrarenal  .  Anemia   . COPD (chronic obstructive pulmonary disease) [facility]) dx April 2013  . Hypertension   . Large B-cell lymphoma ([place]) 11/2011  . Mitral valve prolapse   . Non Hodgkin's lymphoma ([place])   . PAF (paroxysmal atrial fibrillation) (HCC)    On amiodarone, digoxin. Not on coumadin  . Pneumonia April 2013  . Right bundle branch block (RBBB)   . Sick sinus syndrome [facility])     Past Surgical History:  Procedure Laterality Date  . abdominal aorta aneurysm duplex  11/20/2012   normal - demonstrates normal taper  . CARDIAC CATHETERIZATION  09/02/2010   normal LV function, normal coronary arteries, irregularity of the distal aorta with a small aneursymal dilatation  . CARDIOVASCULAR STRESS TEST  08/20/2010   evidence of mild-moderate ischemia in Basal inferoseptal, Basal inferior, Mid inferoseptal and Mid inferior regions. EKG negative for ischemia. Patient developed RBBB during exercise and transient ventricular bigeminal rhythm  . CHEST TUBE INSERTION  1990's   fell  thru deck, punctured lung, chest tube  . CHOLECYSTECTOMY  1980's  . CIRCUMCISION  age 26  . DOPPLER ECHOCARDIOGRAPHY  04/05/2012   EF 63%, borderline MVP with mild mitral regurg  . TONSILLECTOMY      Social History: Patient lives alone, his daughter stays with him most of the time, he is independent for all ADLs.  The patient walks with a cane or walker mostly.  Can exercise on a treadmill gently.  Never smoker.  From [facility].  Worked for [employer].  No Known Allergies  Family history: family history includes Atrial fibrillation in his mother; Cancer in his father; Heart attack in his father.  Prior to Admission medications   Medication Sig Start Date End Date Taking? Authorizing Provider  amiodarone (PACERONE) 200 MG tablet Take 1.5 tablets (300 mg total) by mouth daily. 06/29/16  Yes Troy Sine, MD  aspirin 81 MG tablet Take 81 mg by mouth daily.   Yes [provider]  clopidogrel (PLAVIX) 75 MG tablet Take 1 tablet  (75 mg total) by mouth daily. 06/29/16  Yes Troy Sine, MD  fish oil-omega-3 fatty acids 1000 MG capsule Take 1 g by mouth daily.   Yes [provider]  losartan (COZAAR) 100 MG tablet Take 1 tablet (100 mg total) by mouth daily. 06/29/16  Yes Troy Sine, MD  metoprolol succinate (TOPROL-XL) 25 MG 24 hr tablet Take 1 tablet (25 mg total) by mouth daily. 06/29/16  Yes Troy Sine, MD  tamsulosin (FLOMAX) 0.4 MG CAPS capsule Take 1 capsule by mouth daily. 05/16/16  Yes [provider]  vitamin B-12 (CYANOCOBALAMIN) 1000 MCG tablet Take 1,000 mcg by mouth daily.   Yes [provider]  amLODipine (NORVASC) 5 MG tablet Take 1 tablet (5 mg total) by mouth daily. 06/29/16 09/27/16  Troy Sine, MD       Physical Exam: BP (!) 117/49   Pulse 64   Temp (!) 102.2 F (39 C) (Oral)   Resp 16   Ht 5\' 10"  (1.778 m)   Wt 71.7 kg (158 lb)   SpO2 93%   BMI 22.67 kg/m  General appearance: Well-developed, adult male, alert and in No acute distress.   Eyes: Anicteric, conjunctiva pink, lids and lashes normal. PERRL.    ENT: No nasal deformity, discharge, epistaxis.  Hearing normal. OP moist without lesions.   Neck: No neck masses.  Trachea midline.  No thyromegaly/tenderness. Lymph: No cervical or supraclavicular lymphadenopathy. Skin: Warm and dry.  No jaundice.  Reddish rash on face. Cardiac: RRR, nl S1-S2, no murmurs appreciated.  Capillary refill is brisk.  JVP normal.  No LE edema.  Radial and DP pulses 2+ and symmetric. Respiratory: Tachypnea.  No wheezes, coarse breath sounds throughout, nonfocal. Diminished at bases. Abdomen: Abdomen soft.  No TTP. No ascites, distension, hepatosplenomegaly.   MSK: No deformities or effusions.  No cyanosis or clubbing. Neuro: Cranial nerves grossly intact.  Sensation intact to light touch. Speech is fluent.  Muscle strength globally weak, symmetric.    Psych: Sensorium intact and responding to questions, attention normal.   Behavior appropriate.  Affect norimal.  Judgment and insight appear normal=.     Labs on Admission:  I have personally reviewed following labs and imaging studies: CBC:  Recent Labs Lab 12/27/16 1737  WBC 11.3*  HGB 11.4*  HCT 34.6*  MCV 95.6  PLT 284   Basic Metabolic Panel:  Recent Labs Lab 12/27/16 1737  NA 133*  K 4.0  CL 103  CO2 22  GLUCOSE 129*  BUN 21*  CREATININE 1.40*  CALCIUM 8.6*   GFR: Estimated Creatinine Clearance: 39.1 mL/min (A) (by C-G formula based on SCr of 1.4 mg/dL (H)).  Liver Function Tests:  Recent Labs Lab 12/27/16 1737  AST 23  ALT 31  ALKPHOS 89  BILITOT 0.9  PROT 6.2*  ALBUMIN 4.0   No results for input(s): LIPASE, AMYLASE in the last 168 hours. No results for input(s): AMMONIA in the  last 168 hours. Coagulation Profile: No results for input(s): INR, PROTIME in the last 168 hours. Cardiac Enzymes: No results for input(s): CKTOTAL, CKMB, CKMBINDEX, TROPONINI in the last 168 hours. BNP (last 3 results) No results for input(s): PROBNP in the last 8760 hours. HbA1C: No results for input(s): HGBA1C in the last 72 hours. CBG: No results for input(s): GLUCAP in the last 168 hours. Lipid Profile: No results for input(s): CHOL, HDL, LDLCALC, TRIG, CHOLHDL, LDLDIRECT in the last 72 hours. Thyroid Function Tests: No results for input(s): TSH, T4TOTAL, FREET4, T3FREE, THYROIDAB in the last 72 hours. Anemia Panel: No results for input(s): VITAMINB12, FOLATE, FERRITIN, TIBC, IRON, RETICCTPCT in the last 72 hours. Sepsis Labs: Lactate 1.95 Invalid input(s): PROCALCITONIN, LACTICIDVEN No results found for this or any previous visit (from the past 240 hour(s)).       Radiological Exams on Admission: Personally reviewed CXR shows LLL opacity: Dg Chest 2 View  Result Date: 12/27/2016 CLINICAL DATA:  Productive cough EXAM: CHEST  2 VIEW COMPARISON:  12/08/2011 FINDINGS: Postsurgical changes and scarring in the right upper lobe. Apical  pleural thickening. Ill-defined infiltrate in the left lower lobe. No large pleural effusion. Stable cardiomediastinal silhouette. IMPRESSION: Ill-defined infiltrate in the left lower lobe. Radiographic follow-up to resolution is recommended. Electronically Signed   By: Donavan Foil M.D.   On: 12/27/2016 18:48    EKG: Independently reviewed. Rate 70, QTC 433, R BPP, sinus rhythm  Echocardiogram 2013: Report reviewed EF 55%, mild MR, mild TR, mild AR         Assessment/Plan  1. Community-acquired pneumonia:  PS I 124. -Ceftriaxone and azithromycin IV -Check Legionella antigen given hyponatremia   -We'll check strep antigen -Obtain sputum culture   2. Hyponatremia:  -Check urine sodium, free water clearance -Trend sodium  3. Paroxysmal atrial fibrillation:  CHADS2-VASc 3, not on anticoagulation per patient choice in discussion with cardiology. Currently in sinus rhythm. -Continue amiodarone, metoprolol  4. Hypertension, coronary disease secondary prevention:  Slightly hypertensive -Hold ARB temporarily -Continue Plavix and aspirin -Continue metoprolol  5. Elevated creatinine:  Baseline appears to be 1.1-1.4, CKD stage III, urinalysis bland -IV fluids overnight and trend  6. Non-Hodgkin's lymphoma:  Stable, off treatment, no longer follows with Dr. Benay Spice  7. Other medications:  -Continue Flomax     DVT prophylaxis: Lovenox  Code Status: DO NOT RESUSCITATE  Family Communication: Daughter at bedside  Disposition Plan: Anticipate IV fluids and IV antibiotics, monitor for clinical improvement. Consults called: None Admission status: INPATIENT         Medical decision making: Patient seen at 8:00 PM on 12/27/2016.  The patient was discussed with Dr. Alvino Chapel.  What exists of the patient's chart was reviewed in depth and summarized above.  Clinical condition: hemodynamically stable at present.        Edwin Dada [facility] Pager  [phone]      At the time of admission, it appears that the appropriate admission status for this patient is INPATIENT. This is judged to be reasonable and necessary in order to provide the required intensity of service to ensure the patient's safety given the presenting symptoms, physical exam findings, and initial radiographic and laboratory data in the context of their chronic comorbidities.  Together, these circumstances are felt to place him at high risk for further clinical deterioration threatening life, limb, or organ.   Patient requires inpatient status due to high intensity of service, high risk for further deterioration and high frequency of surveillance  required because of this acute illness that poses a threat to life, limb or bodily function.  Factors support inpatient status included port score 24, in the setting of advanced age, advanced lymphoma no longer on treatment, hyponatremia, paroxysmal atrial fibrillation, coronary artery disease and chronic kidney disease  I certify that at the point of admission it is my clinical judgment that the patient will require inpatient hospital care spanning beyond 2 midnights from the point of admission and that early discharge would result in unnecessary risk of decompensation and readmission or threat to life, limb or bodily function.

## 2016-12-27 NOTE — ED Provider Notes (Signed)
[facility] DEPT Provider Note   CSN: [id number] Arrival date & time: 12/27/16  1716     History   Chief Complaint Chief Complaint  Patient presents with  . Weakness  . Nausea    HPI Thomas Doyle is a 83 y.o. male.  HPI Patient presents from PCPs office. Sees [facility]. Has had generalized weakness and cough for the last few weeks. Has had fevers. Has not had some nausea today. Has been week. Fever of 101 at the doctor's office. Suspicion of pneumonia. Has a history of non-Hodgkin's lymphoma that was stopped treatment under patient request. No diarrhea. Some sputum production.   Past Medical History:  Diagnosis Date  . AAA (abdominal aortic aneurysm) (HCC)    intrarenal  . Anemia   . COPD (chronic obstructive pulmonary disease) [facility]) dx April 2013  . Hypertension   . Large B-cell lymphoma ([place]) 11/2011  . Mitral valve prolapse   . Non Hodgkin's lymphoma ([place])   . PAF (paroxysmal atrial fibrillation) (HCC)    On amiodarone, digoxin. Not on coumadin  . Pneumonia April 2013  . Right bundle branch block (RBBB)   . Sick sinus syndrome [facility])     Patient Active Problem List   Diagnosis Date Noted  . PVC's (premature ventricular contractions) 09/14/2014  . Paroxysmal atrial fibrillation ([place]) 06/23/2013  . Right bundle branch block 06/23/2013  . Mitral valve prolapse 06/23/2013  . Neutropenia ([place]) 03/16/2012  . Constipation 12/09/2011  . Hypercalcemia of malignancy 12/08/2011  . A-fib ([place])   . Normocytic anemia 11/23/2011  . Thrombocytopenia ([place]) 11/23/2011  . Acute renal failure ([place]) 11/23/2011  . Dehydration 11/23/2011  . Hyperbilirubinemia 11/23/2011  . Hypertension 11/23/2011  . COPD (chronic obstructive pulmonary disease) ([place]) 11/23/2011  . UTI (lower urinary tract infection) 11/23/2011  . Large B-cell lymphoma ([place]) 11/23/2011    Past Surgical History:  Procedure Laterality Date  . abdominal aorta aneurysm duplex  11/20/2012   normal -  demonstrates normal taper  . CARDIAC CATHETERIZATION  09/02/2010   normal LV function, normal coronary arteries, irregularity of the distal aorta with a small aneursymal dilatation  . CARDIOVASCULAR STRESS TEST  08/20/2010   evidence of mild-moderate ischemia in Basal inferoseptal, Basal inferior, Mid inferoseptal and Mid inferior regions. EKG negative for ischemia. Patient developed RBBB during exercise and transient ventricular bigeminal rhythm  . CHEST TUBE INSERTION  1990's   fell  thru deck, punctured lung, chest tube  . CHOLECYSTECTOMY  1980's  . CIRCUMCISION  age 62  . DOPPLER ECHOCARDIOGRAPHY  04/05/2012   EF 63%, borderline MVP with mild mitral regurg  . TONSILLECTOMY         Home Medications    Prior to Admission medications   Medication Sig Start Date End Date Taking? Authorizing Provider  amiodarone (PACERONE) 200 MG tablet Take 1.5 tablets (300 mg total) by mouth daily. 06/29/16   Troy Sine, MD  amLODipine (NORVASC) 5 MG tablet Take 1 tablet (5 mg total) by mouth daily. 06/29/16 09/27/16  Troy Sine, MD  aspirin 81 MG tablet Take 81 mg by mouth daily.    [provider]  clopidogrel (PLAVIX) 75 MG tablet Take 1 tablet (75 mg total) by mouth daily. 06/29/16   Troy Sine, MD  fish oil-omega-3 fatty acids 1000 MG capsule Take 1 g by mouth daily.    [provider]  losartan (COZAAR) 100 MG tablet Take 1 tablet (100 mg total) by mouth daily. 06/29/16   Claiborne Billings,  Joyice Faster, MD  metoprolol succinate (TOPROL-XL) 25 MG 24 hr tablet Take 1 tablet (25 mg total) by mouth daily. 06/29/16   Troy Sine, MD  tamsulosin (FLOMAX) 0.4 MG CAPS capsule Take 1 capsule by mouth daily. 05/16/16   [provider]  vitamin B-12 (CYANOCOBALAMIN) 1000 MCG tablet Take 1,000 mcg by mouth daily.    [provider]    Family History Family History  Problem Relation Age of Onset  . Atrial fibrillation Mother   . Cancer Father   . Heart attack Father      Social History Social History  Substance Use Topics  . Smoking status: Never Smoker  . Smokeless tobacco: Never Used  . Alcohol use No     Allergies   Patient has no known allergies.   Review of Systems Review of Systems  Constitutional: Positive for appetite change and fever.  HENT: Negative for congestion.   Respiratory: Positive for cough and shortness of breath.   Gastrointestinal: Positive for nausea and vomiting.  Genitourinary: Negative for flank pain.  Musculoskeletal: Negative for back pain.  Neurological: Positive for numbness.  Hematological: Negative for adenopathy.  Psychiatric/Behavioral: Negative for confusion.     Physical Exam Updated Vital Signs BP 129/66   Pulse 72   Temp (!) 102.2 F (39 C) (Oral)   Resp 16   Ht 5\' 10"  (1.778 m)   Wt 71.7 kg (158 lb)   SpO2 93%   BMI 22.67 kg/m   Physical Exam  Constitutional: He appears well-developed.  HENT:  Head: Normocephalic.  Neck: Neck supple.  Cardiovascular:  Mild tachycardia  Pulmonary/Chest:  Harsh breath sounds, worse on the right base.  Abdominal: Soft. There is no tenderness.  Musculoskeletal: He exhibits no edema.  Neurological: He is alert.  Skin: Skin is warm. Capillary refill takes less than 2 seconds.     ED Treatments / Results  Labs (all labs ordered are listed, but only abnormal results are displayed) Labs Reviewed  COMPREHENSIVE METABOLIC PANEL - Abnormal; Notable for the following:       Result Value   Sodium 133 (*)    Glucose, Bld 129 (*)    BUN 21 (*)    Creatinine, Ser 1.40 (*)    Calcium 8.6 (*)    Total Protein 6.2 (*)    GFR calc non Af Amer 44 (*)    GFR calc Af Amer 51 (*)    All other components within normal limits  CBC - Abnormal; Notable for the following:    WBC 11.3 (*)    RBC 3.62 (*)    Hemoglobin 11.4 (*)    HCT 34.6 (*)    All other components within normal limits  URINALYSIS, ROUTINE W REFLEX MICROSCOPIC - Abnormal; Notable for the  following:    Ketones, ur 20 (*)    All other components within normal limits  DIGOXIN LEVEL - Abnormal; Notable for the following:    Digoxin Level <0.2 (*)    All other components within normal limits  I-STAT CG4 LACTIC ACID, ED  CBG MONITORING, ED    EKG  EKG Interpretation  Date/Time:  Tuesday December 27 2016 17:27:29 EDT Ventricular Rate:  70 PR Interval:    QRS Duration: 154 QT Interval:  447 QTC Calculation: 483 R Axis:   -59 Text Interpretation:  Sinus rhythm Right bundle branch block Anteroseptal infarct, age indeterminate Confirmed by Alvino Chapel  MD, Dayne Dekay [phone]) on 12/27/2016 5:56:39 PM  Radiology Dg Chest 2 View  Result Date: 12/27/2016 CLINICAL DATA:  Productive cough EXAM: CHEST  2 VIEW COMPARISON:  12/08/2011 FINDINGS: Postsurgical changes and scarring in the right upper lobe. Apical pleural thickening. Ill-defined infiltrate in the left lower lobe. No large pleural effusion. Stable cardiomediastinal silhouette. IMPRESSION: Ill-defined infiltrate in the left lower lobe. Radiographic follow-up to resolution is recommended. Electronically Signed   By: Donavan Foil M.D.   On: 12/27/2016 18:48    Procedures Procedures (including critical care time)  Medications Ordered in ED Medications  ondansetron (ZOFRAN) injection 4 mg (not administered)  azithromycin (ZITHROMAX) 500 mg in dextrose 5 % 250 mL IVPB (not administered)  sodium chloride 0.9 % bolus 1,000 mL (1,000 mLs Intravenous New Bag/Given 12/27/16 1814)  cefTRIAXone (ROCEPHIN) 1 g in dextrose 5 % 50 mL IVPB (1 g Intravenous New Bag/Given 12/27/16 1815)  acetaminophen (TYLENOL) tablet 650 mg (650 mg Oral Given 12/27/16 1814)     Initial Impression / Assessment and Plan / ED Course  I have reviewed the triage vital signs and the nursing notes.  Pertinent labs & imaging results that were available during my care of the patient were reviewed by me and considered in my medical decision making (see chart for  details).     Patient presents with fever likely pneumonia. Has had a cough for the last week. Fever 102. Also previous history of non-Hodgkin's lymphoma that is no longer on treatment for. Sees [employer]. Not hypoxic. Feels better after IV fluids and Zofran. Will admit to internal medicine.  Final Clinical Impressions(s) / ED Diagnoses   Final diagnoses:  Community acquired pneumonia, unspecified laterality  Dehydration    New Prescriptions New Prescriptions   No medications on file     Davonna Belling, MD 12/27/16 1949

## 2016-12-27 NOTE — ED Notes (Signed)
Accepting nurse to call back for report 

## 2016-12-27 NOTE — ED Triage Notes (Signed)
Pt coming from PCP office with initial cc as cough, runny nose. Office called EMS for "rapid HR" pt has been 67-74 bpm. Pt has RBBB on EKG with hx of. Pt c.o weakness for the past month that has increased over the past week. Pt also c.o nausea that started today. Pt denies pain. Pt given 4mg  zofran IM at PCP office. VSS 146/56 rr18 97% Room air. 20G LFA. Pt a/ox4, nad

## 2016-12-27 NOTE — ED Notes (Signed)
Patient transported to X-ray 

## 2016-12-28 DIAGNOSIS — E871 Hypo-osmolality and hyponatremia: Secondary | ICD-10-CM

## 2016-12-28 DIAGNOSIS — C8510 Unspecified B-cell lymphoma, unspecified site: Secondary | ICD-10-CM

## 2016-12-28 DIAGNOSIS — E860 Dehydration: Secondary | ICD-10-CM

## 2016-12-28 DIAGNOSIS — J181 Lobar pneumonia, unspecified organism: Secondary | ICD-10-CM

## 2016-12-28 DIAGNOSIS — J189 Pneumonia, unspecified organism: Principal | ICD-10-CM

## 2016-12-28 DIAGNOSIS — I10 Essential (primary) hypertension: Secondary | ICD-10-CM

## 2016-12-28 DIAGNOSIS — J449 Chronic obstructive pulmonary disease, unspecified: Secondary | ICD-10-CM

## 2016-12-28 LAB — CBC
HCT: 30 % — ABNORMAL LOW (ref 39.0–52.0)
Hemoglobin: 9.7 g/dL — ABNORMAL LOW (ref 13.0–17.0)
MCH: 31 pg (ref 26.0–34.0)
MCHC: 32.3 g/dL (ref 30.0–36.0)
MCV: 95.8 fL (ref 78.0–100.0)
Platelets: 151 10*3/uL (ref 150–400)
RBC: 3.13 MIL/uL — ABNORMAL LOW (ref 4.22–5.81)
RDW: 14.3 % (ref 11.5–15.5)
WBC: 9.1 10*3/uL (ref 4.0–10.5)

## 2016-12-28 LAB — BASIC METABOLIC PANEL
Anion gap: 7 (ref 5–15)
BUN: 16 mg/dL (ref 6–20)
CALCIUM: 8.2 mg/dL — AB (ref 8.9–10.3)
CO2: 26 mmol/L (ref 22–32)
CREATININE: 1.24 mg/dL (ref 0.61–1.24)
Chloride: 107 mmol/L (ref 101–111)
GFR, EST AFRICAN AMERICAN: 59 mL/min — AB (ref >60)
GFR, EST NON AFRICAN AMERICAN: 51 mL/min — AB (ref >60)
Glucose, Bld: 103 mg/dL — ABNORMAL HIGH (ref 65–99)
Potassium: 4.6 mmol/L (ref 3.5–5.1)
SODIUM: 140 mmol/L (ref 135–145)

## 2016-12-28 LAB — SODIUM, URINE, RANDOM: Sodium, Ur: 128 mmol/L

## 2016-12-28 LAB — EXPECTORATED SPUTUM ASSESSMENT W GRAM STAIN, RFLX TO RESP C

## 2016-12-28 LAB — OSMOLALITY, URINE: OSMOLALITY UR: 738 mosm/kg (ref 300–900)

## 2016-12-28 LAB — EXPECTORATED SPUTUM ASSESSMENT W REFEX TO RESP CULTURE

## 2016-12-28 LAB — STREP PNEUMONIAE URINARY ANTIGEN: STREP PNEUMO URINARY ANTIGEN: NEGATIVE

## 2016-12-28 LAB — OSMOLALITY: OSMOLALITY: 287 mosm/kg (ref 275–295)

## 2016-12-28 MED ORDER — GUAIFENESIN-DM 100-10 MG/5ML PO SYRP
5.0000 mL | ORAL_SOLUTION | ORAL | Status: DC | PRN
  Administered 2016-12-28: 5 mL via ORAL
  Filled 2016-12-28: qty 5

## 2016-12-28 MED ORDER — BENZONATATE 100 MG PO CAPS
200.0000 mg | ORAL_CAPSULE | Freq: Three times a day (TID) | ORAL | Status: DC
  Administered 2016-12-28 – 2016-12-29 (×5): 200 mg via ORAL
  Filled 2016-12-28 (×5): qty 2

## 2016-12-28 MED ORDER — ENOXAPARIN SODIUM 40 MG/0.4ML ~~LOC~~ SOLN
40.0000 mg | Freq: Every day | SUBCUTANEOUS | Status: DC
  Administered 2016-12-28: 40 mg via SUBCUTANEOUS
  Filled 2016-12-28: qty 0.4

## 2016-12-28 MED ORDER — GUAIFENESIN-CODEINE 100-10 MG/5ML PO SOLN
5.0000 mL | ORAL | Status: DC | PRN
  Administered 2016-12-28 – 2016-12-29 (×3): 5 mL via ORAL
  Filled 2016-12-28 (×3): qty 5

## 2016-12-28 NOTE — Progress Notes (Signed)
[facility]                                                                              Patient Demographics  Thomas Doyle, is a 83 y.o. male, DOB - [birth year], QIW:[id number]  Admit date - 12/27/2016   Admitting Physician Thomas Dada, MD  Outpatient Primary MD for the patient is Thomas Doyle  Outpatient specialists:   LOS - 1  days   Medical records reviewed and are as summarized below:    Chief Complaint  Patient presents with  . Weakness  . Nausea       Brief summary  Doyle Dr. Loleta Doyle, Patient is a 83 year old male with history of NHL stage IV, no longer on treatment, pAF on amiodarone, no anticoagulation, and HTN presented with coughing and fever over the last 3 weeks. Patient reported productive cough with thick white and brown mucus, nasal congestion and slowly progressively worsening. Over the last 3 days had several falls in room the day of admission with shakes and fevers. In ED, temp was 102.50F, respirations 25, chest x-ray showed LLL pneumonia.    Assessment & Plan    Principal Problem:   Community acquired pneumonia - Port score 24, lactic acid 1.95, urine strep antigen negative, UA negative, follow sputum cultures - Continue IV Zithromax, IV Rocephin - Placed on Tessalon Perles, Robitussin, Mucinex, incentive spirometry  Active Problems:   Normocytic anemia  - Follow CBC closely, hemoglobin 9.7 today, possible hemodilution effect from IV fluids - Obtain anemia profile - Baseline hemoglobin 11-12    Essential hypertension - Currently BP borderline, hold metoprolol. ARB currently held    COPD (chronic obstructive pulmonary disease) (HCC) - Continue duo nebs as needed, no wheezing    Large B-cell lymphoma (HCC) - Stable, off treatment, outpatient follow-up with Thomas Doyle    Paroxysmal atrial fibrillation (HCC) - Currently rate controlled, hold metoprolol due to borderline BP - continue amiodarone - [place] VASC score  3, not on anticoagulation Doyle patient's choice    Hyponatremia - Resolved likely mild dehydration due to pneumonia, lactic acid was 1.95 at the time of admission  Code Status: DO NOT RESUSCITATE DVT Prophylaxis:  Lovenox Family Communication: Discussed in detail with the patient, all imaging results, lab results explained to the patient  Disposition Plan:   Time Spent in minutes   35 minutes  Procedures:  None  Consultants:   None  Antimicrobials:   IV Zithromax 6/5  IV Rocephin 6/5   Medications  Scheduled Meds: . amiodarone  300 mg Oral Daily  . aspirin  81 mg Oral Daily  . benzonatate  200 mg Oral TID  . clopidogrel  75 mg Oral Daily  . enoxaparin (LOVENOX) injection  40 mg Subcutaneous QHS  . guaiFENesin  600 mg Oral BID  . tamsulosin  0.4 mg Oral Daily   Continuous Infusions: . azithromycin    . cefTRIAXone (ROCEPHIN)  IV     PRN Meds:.acetaminophen **OR** acetaminophen, diphenhydrAMINE, guaiFENesin-dextromethorphan, Melatonin, ondansetron **OR** ondansetron (ZOFRAN) IV, zolpidem   Antibiotics   Anti-infectives    Start     Dose/Rate Route Frequency Ordered Stop  12/28/16 2000  azithromycin (ZITHROMAX) 500 mg in dextrose 5 % 250 mL IVPB     500 mg 250 mL/hr over 60 Minutes Intravenous Every 24 hours 12/27/16 2216 01/04/17 1959   12/28/16 1800  cefTRIAXone (ROCEPHIN) 1 g in dextrose 5 % 50 mL IVPB     1 g 100 mL/hr over 30 Minutes Intravenous Every 24 hours 12/27/16 2217 01/04/17 1759   12/27/16 1800  cefTRIAXone (ROCEPHIN) 1 g in dextrose 5 % 50 mL IVPB     1 g 100 mL/hr over 30 Minutes Intravenous  Once 12/27/16 1756 12/27/16 1845   12/27/16 1800  azithromycin (ZITHROMAX) 500 mg in dextrose 5 % 250 mL IVPB     500 mg 250 mL/hr over 60 Minutes Intravenous  Once 12/27/16 1756 12/27/16 2105        Subjective:   Thomas Doyle was seen and examined today. still coughing, no fevers or chills today. Patient denies dizziness, chest pain, abdominal  pain, N/V/D/C, new weakness, numbess, tingling. No acute events overnight.    Objective:   Vitals:   12/27/16 2045 12/27/16 2159 12/28/16 0536 12/28/16 1416  BP: (!) 101/50 (!) 128/58 (!) 114/50 (!) 99/47  Pulse: (!) 58 (!) 56 (!) 53 (!) 51  Resp: 16 18 18 16   Temp:  98.3 F (36.8 C) 98.3 F (36.8 C) 98.3 F (36.8 C)  TempSrc:  Oral Oral Oral  SpO2: 93% 93% 96% 94%  Weight:      Height:        Intake/Output Summary (Last 24 hours) at 12/28/16 1423 Last data filed at 12/28/16 1358  Gross Doyle 24 hour  Intake             1440 ml  Output              900 ml  Net              540 ml     Wt Readings from Last 3 Encounters:  12/27/16 71.7 kg (158 lb)  06/29/16 74.8 kg (165 lb)  03/30/16 71 kg (156 lb 9.6 oz)     Exam  General: Alert and oriented x 3, NAD  Eyes: PERRLA, EOMI, Anicteric Sclera,  HEENT:  Atraumatic, normocephalic, normal oropharynx  Cardiovascular: S1 S2 auscultated, no rubs, murmurs or gallops. Regular rate and rhythm.  Respiratory: Bibasilar rhonchi bilaterally  Gastrointestinal: Soft, nontender, nondistended, + bowel sounds  Ext: no pedal edema bilaterally  Neuro: AAOx3, Cr N's II- XII. Strength 5/5 upper and lower extremities bilaterally, speech clear, sensations grossly intact  Musculoskeletal: No digital cyanosis, clubbing  Skin: No rashes  Psych: Normal affect and demeanor, alert and oriented x3    Data Reviewed:  I have personally reviewed following labs and imaging studies  Micro Results Recent Results (from the past 240 hour(s))  Culture, sputum-assessment     Status: None   Collection Time: 12/28/16 10:04 AM  Result Value Ref Range Status   Specimen Description EXPECTORATED SPUTUM  Final   Special Requests NONE  Final   Sputum evaluation   Final    Sputum specimen not acceptable for testing.  Please recollect.   RESULT CALLED TO, READ BACK BY AND VERIFIED WITH: K [facility] 12/28/16 @ 44 M VESTAL    Report Status 12/28/2016 FINAL   Final    Radiology Reports Dg Chest 2 View  Result Date: 12/27/2016 CLINICAL DATA:  Productive cough EXAM: CHEST  2 VIEW COMPARISON:  12/08/2011 FINDINGS: Postsurgical changes and scarring in  the right upper lobe. Apical pleural thickening. Ill-defined infiltrate in the left lower lobe. No large pleural effusion. Stable cardiomediastinal silhouette. IMPRESSION: Ill-defined infiltrate in the left lower lobe. Radiographic follow-up to resolution is recommended. Electronically Signed   By: Donavan Foil M.D.   On: 12/27/2016 18:48    Lab Data:  CBC:  Recent Labs Lab 12/27/16 1737 12/28/16 0407  WBC 11.3* 9.1  HGB 11.4* 9.7*  HCT 34.6* 30.0*  MCV 95.6 95.8  PLT 186 563   Basic Metabolic Panel:  Recent Labs Lab 12/27/16 1737 12/28/16 0407  NA 133* 140  K 4.0 4.6  CL 103 107  CO2 22 26  GLUCOSE 129* 103*  BUN 21* 16  CREATININE 1.40* 1.24  CALCIUM 8.6* 8.2*   GFR: Estimated Creatinine Clearance: 44.2 mL/min (by C-G formula based on SCr of 1.24 mg/dL). Liver Function Tests:  Recent Labs Lab 12/27/16 1737  AST 23  ALT 31  ALKPHOS 89  BILITOT 0.9  PROT 6.2*  ALBUMIN 4.0   No results for input(s): LIPASE, AMYLASE in the last 168 hours. No results for input(s): AMMONIA in the last 168 hours. Coagulation Profile: No results for input(s): INR, PROTIME in the last 168 hours. Cardiac Enzymes: No results for input(s): CKTOTAL, CKMB, CKMBINDEX, TROPONINI in the last 168 hours. BNP (last 3 results) No results for input(s): PROBNP in the last 8760 hours. HbA1C: No results for input(s): HGBA1C in the last 72 hours. CBG: No results for input(s): GLUCAP in the last 168 hours. Lipid Profile: No results for input(s): CHOL, HDL, LDLCALC, TRIG, CHOLHDL, LDLDIRECT in the last 72 hours. Thyroid Function Tests: No results for input(s): TSH, T4TOTAL, FREET4, T3FREE, THYROIDAB in the last 72 hours. Anemia Panel: No results for input(s): VITAMINB12, FOLATE, FERRITIN, TIBC, IRON,  RETICCTPCT in the last 72 hours. Urine analysis:    Component Value Date/Time   COLORURINE YELLOW 12/27/2016 1820   APPEARANCEUR CLEAR 12/27/2016 1820   LABSPEC 1.018 12/27/2016 1820   PHURINE 5.0 12/27/2016 1820   GLUCOSEU NEGATIVE 12/27/2016 1820   HGBUR NEGATIVE 12/27/2016 1820   BILIRUBINUR NEGATIVE 12/27/2016 1820   KETONESUR 20 (A) 12/27/2016 1820   PROTEINUR NEGATIVE 12/27/2016 1820   UROBILINOGEN 0.2 12/08/2011 2020   NITRITE NEGATIVE 12/27/2016 1820   LEUKOCYTESUR NEGATIVE 12/27/2016 1820     Nekisha Mcdiarmid M.D. [facility] 12/28/2016, 2:23 PM  Pager: [phone] Between 7am to 7pm - call Pager - [phone]  After 7pm go to [email or web address] - password [email or web address]  Call night coverage person covering after 7pm

## 2016-12-28 NOTE — Progress Notes (Signed)
Pt was admitted from ED per stretcher accompanied by a nurse tech, on arrival to the floor pt was fully alert and oriented self introduced to pt, ID bracelet checked fall risk assessment and fall prevention plan discussed with pt, vital signs are stable hooked to tele CCMD informed kept on bed alarm, oriented to the room and pt care equipment, prescribed treatment started call light and phone within reach pt able to demonstrate how to use them, will continue to monitor

## 2016-12-29 LAB — FOLATE: Folate: 11.3 ng/mL (ref >5.9)

## 2016-12-29 LAB — BASIC METABOLIC PANEL
Anion gap: 9 (ref 5–15)
BUN: 17 mg/dL (ref 6–20)
CALCIUM: 8.2 mg/dL — AB (ref 8.9–10.3)
CO2: 22 mmol/L (ref 22–32)
CREATININE: 1.32 mg/dL — AB (ref 0.61–1.24)
Chloride: 108 mmol/L (ref 101–111)
GFR calc non Af Amer: 47 mL/min — ABNORMAL LOW (ref >60)
GFR, EST AFRICAN AMERICAN: 55 mL/min — AB (ref >60)
GLUCOSE: 97 mg/dL (ref 65–99)
Potassium: 4 mmol/L (ref 3.5–5.1)
Sodium: 139 mmol/L (ref 135–145)

## 2016-12-29 LAB — CBC
HEMATOCRIT: 30 % — AB (ref 39.0–52.0)
Hemoglobin: 9.7 g/dL — ABNORMAL LOW (ref 13.0–17.0)
MCH: 31.2 pg (ref 26.0–34.0)
MCHC: 32.3 g/dL (ref 30.0–36.0)
MCV: 96.5 fL (ref 78.0–100.0)
PLATELETS: 156 10*3/uL (ref 150–400)
RBC: 3.11 MIL/uL — ABNORMAL LOW (ref 4.22–5.81)
RDW: 14.4 % (ref 11.5–15.5)
WBC: 5.8 10*3/uL (ref 4.0–10.5)

## 2016-12-29 LAB — LEGIONELLA PNEUMOPHILA SEROGP 1 UR AG: L. pneumophila Serogp 1 Ur Ag: NEGATIVE

## 2016-12-29 LAB — FERRITIN: FERRITIN: 172 ng/mL (ref 24–336)

## 2016-12-29 LAB — VITAMIN B12: Vitamin B-12: 593 pg/mL (ref 180–914)

## 2016-12-29 LAB — IRON AND TIBC
Iron: 32 ug/dL — ABNORMAL LOW (ref 45–182)
Saturation Ratios: 16 % — ABNORMAL LOW (ref 17.9–39.5)
TIBC: 196 ug/dL — ABNORMAL LOW (ref 250–450)
UIBC: 164 ug/dL

## 2016-12-29 LAB — RETICULOCYTES
RBC.: 3.11 MIL/uL — AB (ref 4.22–5.81)
RETIC COUNT ABSOLUTE: 46.7 10*3/uL (ref 19.0–186.0)
Retic Ct Pct: 1.5 % (ref 0.4–3.1)

## 2016-12-29 MED ORDER — AZITHROMYCIN 500 MG PO TABS: 500.0000 mg | ORAL_TABLET | Freq: Every day | ORAL | 0 refills | Status: DC

## 2016-12-29 MED ORDER — AZITHROMYCIN 500 MG PO TABS
500.0000 mg | ORAL_TABLET | Freq: Every day | ORAL | Status: DC
  Administered 2016-12-29: 500 mg via ORAL
  Filled 2016-12-29: qty 1

## 2016-12-29 MED ORDER — PNEUMOCOCCAL VAC POLYVALENT 25 MCG/0.5ML IJ INJ: 0.5000 mL | INJECTION | INTRAMUSCULAR | Status: DC | PRN

## 2016-12-29 MED ORDER — CEFPODOXIME PROXETIL 200 MG PO TABS: 200.0000 mg | ORAL_TABLET | Freq: Two times a day (BID) | ORAL | 0 refills | Status: DC

## 2016-12-29 MED ORDER — LOSARTAN POTASSIUM 100 MG PO TABS: 100.0000 mg | ORAL_TABLET | Freq: Every day | ORAL | 4 refills | Status: DC

## 2016-12-29 MED ORDER — GUAIFENESIN ER 600 MG PO TB12: 600.0000 mg | ORAL_TABLET | Freq: Two times a day (BID) | ORAL | 0 refills | Status: DC

## 2016-12-29 MED ORDER — BENZONATATE 200 MG PO CAPS: 200.0000 mg | ORAL_CAPSULE | Freq: Three times a day (TID) | ORAL | 0 refills | Status: DC | PRN

## 2016-12-29 MED ORDER — CEFPODOXIME PROXETIL 200 MG PO TABS: 200.0000 mg | ORAL_TABLET | Freq: Two times a day (BID) | ORAL | Status: DC

## 2016-12-29 MED ORDER — PNEUMOCOCCAL VAC POLYVALENT 25 MCG/0.5ML IJ INJ
0.5000 mL | INJECTION | Freq: Once | INTRAMUSCULAR | Status: AC
  Administered 2016-12-29: 0.5 mL via INTRAMUSCULAR
  Filled 2016-12-29: qty 0.5

## 2016-12-29 MED ORDER — DEXTROSE 5 % IV SOLN
1.0000 g | INTRAVENOUS | Status: DC
  Administered 2016-12-29: 1 g via INTRAVENOUS
  Filled 2016-12-29: qty 10

## 2016-12-29 MED ORDER — GUAIFENESIN-CODEINE 100-10 MG/5ML PO SOLN: 5.0000 mL | ORAL | 0 refills | Status: DC | PRN

## 2016-12-29 MED ORDER — METOPROLOL SUCCINATE ER 25 MG PO TB24: 25.0000 mg | ORAL_TABLET | Freq: Every day | ORAL | 4 refills | Status: DC

## 2016-12-29 NOTE — Progress Notes (Signed)
NURSING PROGRESS NOTE  JADE BURRIGHT [id number] Discharge Data: 12/29/2016 4:15 PM Attending Provider: Mendel Corning, MD ENM:[id number], No Pcp Per     [name] to be D/C'd Home per MD order.  Discussed with the patient the After Visit Summary and all questions fully answered. All IV's discontinued with no bleeding noted. All belongings returned to patient for patient to take home. Pt sent home with written prescriptions.  Last Vital Signs:  Blood pressure (!) 131/57, pulse 60, temperature 98.1 F (36.7 C), temperature source Oral, resp. rate 18, height 5\' 10"  (1.778 m), weight 71.7 kg (158 lb), SpO2 96 %.  Discharge Medication List Allergies as of 12/29/2016   No Known Allergies     Medication List    STOP taking these medications   amLODipine 5 MG tablet Commonly known as:  NORVASC     TAKE these medications   amiodarone 200 MG tablet Commonly known as:  PACERONE Take 1.5 tablets (300 mg total) by mouth daily.   aspirin 81 MG tablet Take 81 mg by mouth daily.   azithromycin 500 MG tablet Commonly known as:  ZITHROMAX Take 1 tablet (500 mg total) by mouth daily. X 6 more days   benzonatate 200 MG capsule Commonly known as:  TESSALON Take 1 capsule (200 mg total) by mouth 3 (three) times daily as needed for cough.   cefpodoxime 200 MG tablet Commonly known as:  VANTIN Take 1 tablet (200 mg total) by mouth every 12 (twelve) hours. X 6 more days Start taking on:  12/30/2016   clopidogrel 75 MG tablet Commonly known as:  PLAVIX Take 1 tablet (75 mg total) by mouth daily.   fish oil-omega-3 fatty acids 1000 MG capsule Take 1 g by mouth daily.   guaiFENesin 600 MG 12 hr tablet Commonly known as:  MUCINEX Take 1 tablet (600 mg total) by mouth 2 (two) times daily.   guaiFENesin-codeine 100-10 MG/5ML syrup Take 5 mLs by mouth every 4 (four) hours as needed for cough.   losartan 100 MG tablet Commonly known as:  COZAAR Take 1 tablet (100 mg total) by mouth daily.  Hold until follow-up with your doctor What changed:  additional instructions   metoprolol succinate 25 MG 24 hr tablet Commonly known as:  TOPROL-XL Take 1 tablet (25 mg total) by mouth daily. HOLD UNTIL FOLLOW-UP WITH YOUR PCP What changed:  additional instructions   tamsulosin 0.4 MG Caps capsule Commonly known as:  FLOMAX Take 1 capsule by mouth daily.   vitamin B-12 1000 MCG tablet Commonly known as:  CYANOCOBALAMIN Take 1,000 mcg by mouth daily.

## 2016-12-29 NOTE — Discharge Summary (Signed)
Physician Discharge Summary   Patient ID: PRITHVI KOOI MRN: [id number] DOB/AGE: 09/23/1928 83 y.o.  Admit date: 12/27/2016 Discharge date: 12/29/2016  Primary Care Physician:  Patient, No Pcp Per  Discharge Diagnoses:    . Community acquired pneumonia . COPD (chronic obstructive pulmonary disease) ([place]) . Large B-cell lymphoma ([place]) . Normocytic anemia . Paroxysmal atrial fibrillation ([facility]) . Essential hypertension . Hyponatremia   Consults:  None   Recommendations for Outpatient Follow-up:  1. Please repeat CBC/BMET at next visit 2. Please hold metoprolol and losartan until follow-up with PCP due to borderline BP and bradycardia   DIET: Heart healthy diet    Allergies:  No Known Allergies   DISCHARGE MEDICATIONS: Current Discharge Medication List    START taking these medications   Details  azithromycin (ZITHROMAX) 500 MG tablet Take 1 tablet (500 mg total) by mouth daily. X 6 more days Qty: 6 tablet, Refills: 0    benzonatate (TESSALON) 200 MG capsule Take 1 capsule (200 mg total) by mouth 3 (three) times daily as needed for cough. Qty: 60 capsule, Refills: 0    cefpodoxime (VANTIN) 200 MG tablet Take 1 tablet (200 mg total) by mouth every 12 (twelve) hours. X 6 more days Qty: 12 tablet, Refills: 0    guaiFENesin (MUCINEX) 600 MG 12 hr tablet Take 1 tablet (600 mg total) by mouth 2 (two) times daily. Qty: 30 tablet, Refills: 0    guaiFENesin-codeine 100-10 MG/5ML syrup Take 5 mLs by mouth every 4 (four) hours as needed for cough. Qty: 120 mL, Refills: 0      CONTINUE these medications which have CHANGED   Details  losartan (COZAAR) 100 MG tablet Take 1 tablet (100 mg total) by mouth daily. Hold until follow-up with your doctor Qty: 90 tablet, Refills: 4    metoprolol succinate (TOPROL-XL) 25 MG 24 hr tablet Take 1 tablet (25 mg total) by mouth daily. HOLD UNTIL FOLLOW-UP WITH YOUR PCP Qty: 90 tablet, Refills: 4      CONTINUE these medications which  have NOT CHANGED   Details  amiodarone (PACERONE) 200 MG tablet Take 1.5 tablets (300 mg total) by mouth daily. Qty: 45 tablet, Refills: 10    aspirin 81 MG tablet Take 81 mg by mouth daily.   Associated Diagnoses: Normocytic anemia    clopidogrel (PLAVIX) 75 MG tablet Take 1 tablet (75 mg total) by mouth daily. Qty: 90 tablet, Refills: 4    fish oil-omega-3 fatty acids 1000 MG capsule Take 1 g by mouth daily.    tamsulosin (FLOMAX) 0.4 MG CAPS capsule Take 1 capsule by mouth daily. Refills: 2    vitamin B-12 (CYANOCOBALAMIN) 1000 MCG tablet Take 1,000 mcg by mouth daily.      STOP taking these medications     amLODipine (NORVASC) 5 MG tablet          Brief H and P: For complete details please refer to admission H and P, but in brief Per Dr. Loleta Books, Patient is a 83 year old male with history of NHL stage IV, no longer on treatment, pAF on amiodarone, no anticoagulation, and HTN presented with coughing and fever over the last 3 weeks. Patient reported productive cough with thick white and brown mucus, nasal congestion and slowly progressively worsening. Over the last 3 days had several falls in room the day of admission with shakes and fevers. In ED, temp was 102.63F, respirations 25, chest x-ray showed LLL pneumonia.  Hospital Course:  Community acquired pneumonia - Port score 24,  lactic acid 1.95, urine strep antigen negative, UA negative, follow sputum cultures - Patient was placed on IV Zithromax and IV Rocephin  - Placed on Tessalon Perles, Robitussin, Mucinex, incentive spirometry - At the time of discharge, was transitioned to oral Zithromax and Vantin . Patient back to baseline, PT evaluation recommended no PT follow-up.    Normocytic anemia  -  possible hemodilution effect from IV fluids - Per anemia profile slightly low iron levels but more consistent with anemia of chronic disease  - Baseline hemoglobin 11-12, hemoglobin remained stable    Essential  hypertension - BP soft, patient also had bradycardia during hospitalization, hence beta blocker held    COPD (chronic obstructive pulmonary disease) (HCC) - Continue duo nebs as needed, no wheezing    Large B-cell lymphoma ([facility]CC) - Stable, off treatment, outpatient follow-up with Dr.Sherill    Paroxysmal atrial fibrillation ([facility]) - Currently rate controlled, hold metoprolol due to borderline BP and bradycardia - continue amiodarone - [place] VASC score 3, not on anticoagulation per patient's choice    Hyponatremia - Resolved likely mild dehydration due to pneumonia, lactic acid was 1.95 at the time of admission Sodium 139 at the time of discharge.  Day of Discharge BP 124/77 (BP Location: Right Arm)   Pulse (!) 50   Temp 98.4 F (36.9 C) (Oral)   Resp 18   Ht 5\' 10"  (1.778 m)   Wt 71.7 kg (158 lb)   SpO2 96%   BMI 22.67 kg/m   Physical Exam: General: Alert and awake oriented x3 not in any acute distress. HEENT: anicteric sclera, pupils reactive to light and accommodation CVS: S1-S2 clear no murmur rubs or gallops Chest: clear to auscultation bilaterally, no wheezing rales or rhonchi Abdomen: soft nontender, nondistended, normal bowel sounds Extremities: no cyanosis, clubbing or edema noted bilaterally Neuro: Cranial nerves II-XII intact, no focal neurological deficits   The results of significant diagnostics from this hospitalization (including imaging, microbiology, ancillary and laboratory) are listed below for reference.    LAB RESULTS: Basic Metabolic Panel:  Recent Labs Lab 12/28/16 0407 12/29/16 0434  NA 140 139  K 4.6 4.0  CL 107 108  CO2 26 22  GLUCOSE 103* 97  BUN 16 17  CREATININE 1.24 1.32*  CALCIUM 8.2* 8.2*   Liver Function Tests:  Recent Labs Lab 12/27/16 1737  AST 23  ALT 31  ALKPHOS 89  BILITOT 0.9  PROT 6.2*  ALBUMIN 4.0   No results for input(s): LIPASE, AMYLASE in the last 168 hours. No results for input(s): AMMONIA in the last  168 hours. CBC:  Recent Labs Lab 12/28/16 0407 12/29/16 0434  WBC 9.1 5.8  HGB 9.7* 9.7*  HCT 30.0* 30.0*  MCV 95.8 96.5  PLT 151 156   Cardiac Enzymes: No results for input(s): CKTOTAL, CKMB, CKMBINDEX, TROPONINI in the last 168 hours. BNP: Invalid input(s): POCBNP CBG: No results for input(s): GLUCAP in the last 168 hours.  Significant Diagnostic Studies:  Dg Chest 2 View  Result Date: 12/27/2016 CLINICAL DATA:  Productive cough EXAM: CHEST  2 VIEW COMPARISON:  12/08/2011 FINDINGS: Postsurgical changes and scarring in the right upper lobe. Apical pleural thickening. Ill-defined infiltrate in the left lower lobe. No large pleural effusion. Stable cardiomediastinal silhouette. IMPRESSION: Ill-defined infiltrate in the left lower lobe. Radiographic follow-up to resolution is recommended. Electronically Signed   By: Donavan Foil M.D.   On: 12/27/2016 18:48    2D ECHO:   Disposition and Follow-up: Discharge Instructions  Diet - low sodium heart healthy    Complete by:  As directed    Discharge instructions    Complete by:  As directed    Hold your BP meds until you follow up with your doctor. Check your BP daily, if BP starts running high in 140's - 150's, you may restart one of your BP meds.   Increase activity slowly    Complete by:  As directed        DISPOSITION: Home   DISCHARGE FOLLOW-UP Follow-up Information    Troy Sine, MD. Schedule an appointment as soon as possible for a visit in 2 week(s).   Specialty:  Cardiology Contact information: [address] [place] [address] [phone]            Time spent on Discharge: 37 minutes  Signed:   Estill Cotta M.D. [facility] 12/29/2016, 1:05 PM Pager: [phone]

## 2016-12-29 NOTE — Evaluation (Signed)
Physical Therapy Evaluation Patient Details Name: Thomas Doyle MRN: [id number] DOB: May 22, 1929 Today's Date: 12/29/2016   History of Present Illness  Pt is an 83 yo male admitted through ED with cough, weakness, multiple falls and shakiness over the past two weeks. Pt was diagnosed wtih community acquired pneumonia and has been treated with antibiotics. PMH significant for AAA, Anemia, COPD, HTN, lymphoma, PAF, hodgkins lymphoma.   Clinical Impression  Pt presents with the above diagnosis and below deficits for therapy evaluation. Prior to admission, pt was completely independent and moving around home without an AD except when he went out of the house he occasionally used a cane at his daughters request. Pt is able to mobilize with supervision this session and has decreased dyspnea and no significant cough following activity. Pt will benefit from continued acute rehab services in order to assist with below deficits prior to discharge.     Follow Up Recommendations No PT follow up    Equipment Recommendations  None recommended by PT    Recommendations for Other Services       Precautions / Restrictions Precautions Precautions: Fall Restrictions Weight Bearing Restrictions: No      Mobility  Bed Mobility Overal bed mobility: Independent             General bed mobility comments: able to get EOB without any assistance from clinician  Transfers Overall transfer level: Modified independent               General transfer comment: able to stand from EOB with increased time, no assistance  Ambulation/Gait Ambulation/Gait assistance: Supervision;Min guard Ambulation Distance (Feet): 250 Feet Assistive device: None;1 person hand held assist Gait Pattern/deviations: Step-through pattern;Decreased step length - right;Decreased step length - left Gait velocity: decreased Gait velocity interpretation: Below normal speed for age/gender General Gait Details: slower cadence  and decreased step length bilaterally. Pt requires min guard and HHA initally and progressed to supervision without any assistance as gait distance increased  Stairs            Wheelchair Mobility    Modified Rankin (Stroke Patients Only)       Balance Overall balance assessment: Needs assistance Sitting-balance support: No upper extremity supported;Feet supported Sitting balance-Leahy Scale: Normal     Standing balance support: No upper extremity supported Standing balance-Leahy Scale: Fair                               Pertinent Vitals/Pain Pain Assessment: No/denies pain    Home Living Family/patient expects to be discharged to:: Private residence Living Arrangements: Children Available Help at Discharge: Family;Available 24 hours/day Type of Home: House Home Access: Stairs to enter Entrance Stairs-Rails: Right;Left;Can reach both Entrance Stairs-Number of Steps: 6 Home Layout: Two level;Full bath on main level Home Equipment: Walker - 2 wheels;Cane - single point;Shower seat;Grab bars - tub/shower      Prior Function Level of Independence: Independent with assistive device(s)         Comments: was completely independent prior to admission.      Hand Dominance   Dominant Hand: Right    Extremity/Trunk Assessment   Upper Extremity Assessment Upper Extremity Assessment: Overall WFL for tasks assessed    Lower Extremity Assessment Lower Extremity Assessment: Overall WFL for tasks assessed    Cervical / Trunk Assessment Cervical / Trunk Assessment: Normal  Communication   Communication: No difficulties  Cognition Arousal/Alertness: Awake/alert Behavior During Therapy: [facility]  for tasks assessed/performed Overall Cognitive Status: Within Functional Limits for tasks assessed                                        General Comments      Exercises     Assessment/Plan    PT Assessment Patient needs continued PT  services  PT Problem List Decreased activity tolerance;Decreased balance;Decreased mobility       PT Treatment Interventions Gait training;Stair training;Therapeutic activities;Therapeutic exercise;Balance training    PT Goals (Current goals can be found in the Care Plan section)  Acute Rehab PT Goals Patient Stated Goal: to get back to his dogs PT Goal Formulation: With patient Time For Goal Achievement: 01/05/17 Potential to Achieve Goals: Good    Frequency Min 3X/week   Barriers to discharge        Co-evaluation               AM-PAC PT "6 Clicks" Daily Activity  Outcome Measure Difficulty turning over in bed (including adjusting bedclothes, sheets and blankets)?: None Difficulty moving from lying on back to sitting on the side of the bed? : None Difficulty sitting down on and standing up from a chair with arms (e.g., wheelchair, bedside commode, etc,.)?: None Help needed moving to and from a bed to chair (including a wheelchair)?: A Little Help needed walking in hospital room?: A Little Help needed climbing 3-5 steps with a railing? : A Lot 6 Click Score: 20    End of Session Equipment Utilized During Treatment: Gait belt Activity Tolerance: Patient tolerated treatment well Patient left: in chair;with call bell/phone within reach;with chair alarm set Nurse Communication: Mobility status PT Visit Diagnosis: Unsteadiness on feet (R26.81)    Time: 4627-0350 PT Time Calculation (min) (ACUTE ONLY): 33 min   Charges:   PT Evaluation $PT Eval Moderate Complexity: 1 Procedure PT Treatments $Gait Training: 8-22 mins   PT G Codes:        Scheryl Marten PT, DPT  [phone]   Jacqulyn Liner Sloan Leiter 12/29/2016, 10:47 AM

## 2016-12-29 NOTE — Progress Notes (Signed)
Pt was running sinus brady on heart monitor sustaining at 44 on call physician Baltazar Najjar was paged no new order yet will continue to monitor pt

## 2017-01-03 ENCOUNTER — Telehealth: Payer: Self-pay | Admitting: Cardiovascular Disease

## 2017-01-03 NOTE — Telephone Encounter (Signed)
Notified daughter that discharge summary does not indicate need for CXR but does indicate that a CBC & BMET are needed. Advised that this can be done at the office on same day as appt on 6/22

## 2017-01-03 NOTE — Telephone Encounter (Signed)
New message    Pt daughter is calling asking about a chest xray. She said the hospital wants him to have another one since being discharged from the hospital.

## 2017-01-13 ENCOUNTER — Ambulatory Visit
Admission: RE | Admit: 2017-01-13 | Discharge: 2017-01-13 | Disposition: A | Discharge status: Home / Self Care | Payer: Medicare Other | Source: Ambulatory Visit | Attending: Physician Assistant | Admitting: Physician Assistant

## 2017-01-13 ENCOUNTER — Ambulatory Visit (INDEPENDENT_AMBULATORY_CARE_PROVIDER_SITE_OTHER): Payer: Medicare Other | Admitting: Physician Assistant

## 2017-01-13 ENCOUNTER — Encounter: Payer: Self-pay | Admitting: Physician Assistant

## 2017-01-13 VITALS — BP 138/73 | HR 100 | Ht 70.0 in | Wt 160.6 lb

## 2017-01-13 DIAGNOSIS — I480 Paroxysmal atrial fibrillation: Secondary | ICD-10-CM

## 2017-01-13 DIAGNOSIS — C8510 Unspecified B-cell lymphoma, unspecified site: Secondary | ICD-10-CM

## 2017-01-13 DIAGNOSIS — R001 Bradycardia, unspecified: Secondary | ICD-10-CM

## 2017-01-13 DIAGNOSIS — I4510 Unspecified right bundle-branch block: Secondary | ICD-10-CM

## 2017-01-13 DIAGNOSIS — I10 Essential (primary) hypertension: Secondary | ICD-10-CM

## 2017-01-13 DIAGNOSIS — J189 Pneumonia, unspecified organism: Secondary | ICD-10-CM | POA: Diagnosis not present

## 2017-01-13 MED ORDER — METOPROLOL SUCCINATE ER 25 MG PO TB24: 25.0000 mg | ORAL_TABLET | Freq: Every day | ORAL | 4 refills | Status: DC

## 2017-01-13 MED ORDER — ZOLPIDEM TARTRATE 5 MG PO TABS: 5.0000 mg | ORAL_TABLET | Freq: Every evening | ORAL | 0 refills | Status: DC | PRN

## 2017-01-13 NOTE — Patient Instructions (Addendum)
Medication Instructions:  Your physician has recommended you make the following change in your medication:  1.  RESTART Metoprolol XL 25 mg daily 2.  START Ambien 5 mg taking 1/2 tablet as needed for sleep  Labwork: TODAY:  BMET & CBC  Testing/Procedures: A chest x-ray takes a picture of the organs and structures inside the chest, including the heart, lungs, and blood vessels. This test can show several things, including, whether the heart is enlarges; whether fluid is building up in the lungs; and whether pacemaker / defibrillator leads are still in place. THIS IS DONE AT [place], [place].  [address] TAKE A LEFT ONTO [address], TAKE A LEFT ONTO [address] AND ITS THE 1ST BUILDING ON THE LEFT.[name]  [facility] HAS ITS OWN ENTRANCE ON THE BOTTOM FLOOR   Follow-Up: Your physician recommends that you schedule a follow-up appointment in: AS NEEDED   Any Other Special Instructions Will Be Listed Below (If Applicable).    If you need a refill on your cardiac medications before your next appointment, please call your pharmacy.

## 2017-01-13 NOTE — Progress Notes (Signed)
Seen today in office, as expected, left lower lobe pneumonia resolved.

## 2017-01-13 NOTE — Progress Notes (Signed)
Cardiology Office Note    Date:  01/13/2017   ID:  Thomas Doyle, DOB 20-Aug-1928, MRN [id number]  PCP:  Patient, No Pcp Per  Cardiologist:  Dr. Claiborne Billings  Chief Complaint  Patient presents with  . Follow-up    seen for Dr. Claiborne Billings. Recent hospitalization for left lower lobe PNA    History of Present Illness:  Thomas Doyle is a 83 y.o. male with PMH of HTN, COPD, large B-cell lymphoma, PAF on amiodarone, AAA, RBBB and SSS. Cardiac catheterization performed on 07/03/2011 revealed normal coronaries, angiographic evidence of mitral valve prolapse. He also had small abdominal aortic aneurysm. Based on office note on 12/76/2017, his lymphoma has progressed to stage IV at the time, he also made himself a DO NOT RESUSCITATE. Patient was recently admitted with left lower lobe pneumonia. He was treated with IVs Zithromax and IV Rocephin. He is no longer on systemic anticoagulation.  From breathing perspective, he has been doing relatively well since he left the hospital. He is no longer followed by a primary care physician at this point. He continued to feel weaker each day but wanted to hold off on the rolling in hospice. He has given up all treatment at this time and just wanted to enjoy day by day. He is very pleasant and quite calm about the future outcome. I will obtain a CBC, basic metabolic panel and a repeat chest x-ray. He also has some issue following asleep. He does take melatonin at home, however it only last 2 hours before he wakes up again. He wished to obtain some sleep medication, I did clear with Dr. Claiborne Billings who gave him a 30 day supply of Ambien. I also encouraged him to restart the 25 mg Toprol-XL. I will permanently discontinue losartan and amlodipine.    Past Medical History:  Diagnosis Date  . AAA (abdominal aortic aneurysm) (HCC)    intrarenal  . Anemia   . COPD (chronic obstructive pulmonary disease) [facility]) dx April 2013  . Hypertension   . Large B-cell lymphoma ([facility]) 11/2011  .  Mitral valve prolapse   . Non Hodgkin's lymphoma ([place])   . PAF (paroxysmal atrial fibrillation) (HCC)    On amiodarone, digoxin. Not on coumadin  . Pneumonia April 2013  . Right bundle branch block (RBBB)   . Sick sinus syndrome [facility])     Past Surgical History:  Procedure Laterality Date  . abdominal aorta aneurysm duplex  11/20/2012   normal - demonstrates normal taper  . CARDIAC CATHETERIZATION  09/02/2010   normal LV function, normal coronary arteries, irregularity of the distal aorta with a small aneursymal dilatation  . CARDIOVASCULAR STRESS TEST  08/20/2010   evidence of mild-moderate ischemia in Basal inferoseptal, Basal inferior, Mid inferoseptal and Mid inferior regions. EKG negative for ischemia. Patient developed RBBB during exercise and transient ventricular bigeminal rhythm  . CHEST TUBE INSERTION  1990's   fell  thru deck, punctured lung, chest tube  . CHOLECYSTECTOMY  1980's  . CIRCUMCISION  age 55  . DOPPLER ECHOCARDIOGRAPHY  04/05/2012   EF 63%, borderline MVP with mild mitral regurg  . TONSILLECTOMY      Current Medications: Outpatient Medications Prior to Visit  Medication Sig Dispense Refill  . amiodarone (PACERONE) 200 MG tablet Take 1.5 tablets (300 mg total) by mouth daily. 45 tablet 10  . aspirin 81 MG tablet Take 81 mg by mouth daily.    . clopidogrel (PLAVIX) 75 MG tablet Take 1 tablet (75 mg total)  by mouth daily. 90 tablet 4  . fish oil-omega-3 fatty acids 1000 MG capsule Take 1 g by mouth daily.    [name] guaiFENesin (MUCINEX) 600 MG 12 hr tablet Take 1 tablet (600 mg total) by mouth 2 (two) times daily. 30 tablet 0  . vitamin B-12 (CYANOCOBALAMIN) 1000 MCG tablet Take 1,000 mcg by mouth daily.    [name] azithromycin (ZITHROMAX) 500 MG tablet Take 1 tablet (500 mg total) by mouth daily. X 6 more days 6 tablet 0  . benzonatate (TESSALON) 200 MG capsule Take 1 capsule (200 mg total) by mouth 3 (three) times daily as needed for cough. 60 capsule 0  . cefpodoxime  (VANTIN) 200 MG tablet Take 1 tablet (200 mg total) by mouth every 12 (twelve) hours. X 6 more days 12 tablet 0  . guaiFENesin-codeine 100-10 MG/5ML syrup Take 5 mLs by mouth every 4 (four) hours as needed for cough. 120 mL 0  . losartan (COZAAR) 100 MG tablet Take 1 tablet (100 mg total) by mouth daily. Hold until follow-up with your doctor 90 tablet 4  . metoprolol succinate (TOPROL-XL) 25 MG 24 hr tablet Take 1 tablet (25 mg total) by mouth daily. HOLD UNTIL FOLLOW-UP WITH YOUR PCP 90 tablet 4  . tamsulosin (FLOMAX) 0.4 MG CAPS capsule Take 1 capsule by mouth daily.  2   No facility-administered medications prior to visit.      Allergies:   Patient has no known allergies.   Social History   Social History  . Marital status: Widowed    Spouse name: N/A  . Number of children: 2  . Years of education: 14   Occupational History  . Retired from [employer]  . Smoking status: Never Smoker  . Smokeless tobacco: Never Used  . Alcohol use No  . Drug use: No  . Sexual activity: Not Asked   Other Topics Concern  . None   Social History Narrative   Widowed x 3 years.  Lives alone.  Independent of ADLs and ambulation.  Emergency Contact: Thomas Doyle ([place]): [phone]. Has two daughters. Still living at home, doesn't use cane/walker.               Family History:  The patient's family history includes Atrial fibrillation in his mother; Cancer in his father; Heart attack in his father.   ROS:   Please see the history of present illness.    ROS All other systems reviewed and are negative.   PHYSICAL EXAM:   VS:  BP 138/73   Pulse 100   Ht 5\' 10"  (1.778 m)   Wt 160 lb 9.6 oz (72.8 kg)   SpO2 98%   BMI 23.04 kg/m    GEN: Well nourished, well developed, in no acute distress  HEENT: normal  Neck: no JVD, carotid bruits, or masses Cardiac: RRR; no murmurs, rubs, or gallops,no edema  Respiratory:  clear to auscultation bilaterally, normal work of  breathing GI: soft, nontender, nondistended, + BS MS: no deformity or atrophy  Skin: warm and dry, no rash Neuro:  Alert and Oriented x 3, Strength and sensation are intact Psych: euthymic mood, full affect  Wt Readings from Last 3 Encounters:  01/13/17 160 lb 9.6 oz (72.8 kg)  12/27/16 158 lb (71.7 kg)  06/29/16 165 lb (74.8 kg)      Studies/Labs Reviewed:   EKG:  EKG is not ordered today.   Recent Labs: 12/27/2016: ALT 31 12/29/2016: BUN 17;  Creatinine, Ser 1.32; Hemoglobin 9.7; Platelets 156; Potassium 4.0; Sodium 139   Lipid Panel No results found for: CHOL, TRIG, HDL, CHOLHDL, VLDL, LDLCALC, LDLDIRECT  Additional studies/ records that were reviewed today include:   Previous office visit.   Cath 09/04/2015 IMPRESSION: 1. Normal left ventricular function. 2. Mitral valve prolapse. 3. Normal coronary arteries. 4. Irregularity of the distal aorta with a small aneurysmal dilatation.   ASSESSMENT:    1. Paroxysmal atrial fibrillation (HCC)   2. Essential hypertension   3. Large B-cell lymphoma ([place])   4. Right bundle branch block   5. Bradycardia      PLAN:  In order of problems listed above:  1. Paroxysmal atrial fibrillation on amiodarone: Toprol-XL was stopped during recent hospitalization due to hypotension, he has recovered from pneumonia, we will obtain a chest x-ray. I also recommended to restart Toprol-XL at 25 mg daily. I will permanently discontinue losartan and amlodipine. His heart rate is borderline elevated, we starting the Toprol-XL will help with rate control. I did not get a EKG today, on physical exam, his heart rate is very regular. He is currently on aspirin. No need for systemic anticoagulation at this point.  2. Hypertension: Blood pressure mildly elevated today, we starting Toprol-XL 25 mg one daily  3. Large B cell lymphoma: Stage IV, he has stopped all treatment. He says his primary care provider has retired. The only provider he is currently  seen is Dr. Claiborne Billings. He says he may donate his body to medical school for anatomy lab or he may enroll in free program for cremation.   Medication Adjustments/Labs and Tests Ordered: Current medicines are reviewed at length with the patient today.  Concerns regarding medicines are outlined above.  Medication changes, Labs and Tests ordered today are listed in the Patient Instructions below. Patient Instructions  Medication Instructions:  Your physician has recommended you make the following change in your medication:  1.  RESTART Metoprolol XL 25 mg daily 2.  START Ambien 5 mg taking 1/2 tablet as needed for sleep  Labwork: TODAY:  BMET & CBC  Testing/Procedures: A chest x-ray takes a picture of the organs and structures inside the chest, including the heart, lungs, and blood vessels. This test can show several things, including, whether the heart is enlarges; whether fluid is building up in the lungs; and whether pacemaker / defibrillator leads are still in place. THIS IS DONE AT [place], [place].  [address] TAKE A LEFT ONTO [address], TAKE A LEFT ONTO [place] AND ITS THE 1ST BUILDING ON THE LEFT.[name] HAS ITS OWN ENTRANCE ON THE BOTTOM FLOOR   Follow-Up: Your physician recommends that you schedule a follow-up appointment in: AS NEEDED   Any Other Special Instructions Will Be Listed Below (If Applicable).    If you need a refill on your cardiac medications before your next appointment, please call your pharmacy.      Hilbert Corrigan, [place]  01/13/2017 11:13 AM    [address], [place], [place]  [address] Phone: [phone]; Fax: [phone]

## 2017-01-14 LAB — CBC
HEMOGLOBIN: 12.5 g/dL — AB (ref 13.0–17.7)
Hematocrit: 37.8 % (ref 37.5–51.0)
MCH: 31.7 pg (ref 26.6–33.0)
MCHC: 33.1 g/dL (ref 31.5–35.7)
MCV: 96 fL (ref 79–97)
Platelets: 256 10*3/uL (ref 150–379)
RBC: 3.94 x10E6/uL — AB (ref 4.14–5.80)
RDW: 15 % (ref 12.3–15.4)
WBC: 5 10*3/uL (ref 3.4–10.8)

## 2017-01-14 LAB — BASIC METABOLIC PANEL
BUN / CREAT RATIO: 15 (ref 10–24)
BUN: 21 mg/dL (ref 8–27)
CHLORIDE: 103 mmol/L (ref 96–106)
CO2: 22 mmol/L (ref 20–29)
CREATININE: 1.37 mg/dL — AB (ref 0.76–1.27)
Calcium: 9.3 mg/dL (ref 8.6–10.2)
GFR calc non Af Amer: 47 mL/min/{1.73_m2} — ABNORMAL LOW (ref >59)
GFR, EST AFRICAN AMERICAN: 54 mL/min/{1.73_m2} — AB (ref >59)
GLUCOSE: 96 mg/dL (ref 65–99)
Potassium: 5.3 mmol/L — ABNORMAL HIGH (ref 3.5–5.2)
SODIUM: 140 mmol/L (ref 134–144)

## 2017-03-24 ENCOUNTER — Other Ambulatory Visit: Payer: Self-pay | Admitting: Cardiovascular Disease

## 2017-04-21 ENCOUNTER — Ambulatory Visit (INDEPENDENT_AMBULATORY_CARE_PROVIDER_SITE_OTHER): Payer: Medicare Other | Admitting: Cardiovascular Disease

## 2017-04-21 ENCOUNTER — Encounter: Payer: Self-pay | Admitting: Cardiovascular Disease

## 2017-04-21 VITALS — BP 184/73 | HR 50 | Ht 70.0 in | Wt 166.4 lb

## 2017-04-21 DIAGNOSIS — I10 Essential (primary) hypertension: Secondary | ICD-10-CM

## 2017-04-21 DIAGNOSIS — I444 Left anterior fascicular block: Secondary | ICD-10-CM | POA: Diagnosis not present

## 2017-04-21 DIAGNOSIS — Z79899 Other long term (current) drug therapy: Secondary | ICD-10-CM

## 2017-04-21 DIAGNOSIS — C8510 Unspecified B-cell lymphoma, unspecified site: Secondary | ICD-10-CM | POA: Diagnosis not present

## 2017-04-21 DIAGNOSIS — I480 Paroxysmal atrial fibrillation: Secondary | ICD-10-CM | POA: Diagnosis not present

## 2017-04-21 DIAGNOSIS — I4510 Unspecified right bundle-branch block: Secondary | ICD-10-CM | POA: Diagnosis not present

## 2017-04-21 MED ORDER — AMLODIPINE BESYLATE 5 MG PO TABS: 5.0000 mg | ORAL_TABLET | Freq: Every day | ORAL | 3 refills | Status: DC

## 2017-04-21 NOTE — Patient Instructions (Addendum)
Medication Instructions:  START amlodipine (Norvasc) 5mg  (1 tablet) daily  Follow-Up: Your physician wants you to follow-up in: 6 MONTHS with Dr. Claiborne Billings.You will receive a reminder letter in the mail two months in advance. If you don't receive a letter, please call our office to schedule the follow-up appointment.   Any Other Special Instructions Will Be Listed Below (If Applicable).     If you need a refill on your cardiac medications before your next appointment, please call your pharmacy.

## 2017-04-21 NOTE — Progress Notes (Signed)
Patient ID: Thomas Doyle, male   DOB: 1929-04-12, 83 y.o.   MRN: [id number]      HPI: Thomas Doyle is a 83 y.o. male who is a former patient of Dr. Rollene Doyle who established cardiology care with me in November 2014.  He now presents for a 10 month follow-up evaluation.    Thomas Doyle has a history of sick sinus syndrome with paroxysmal atrial fibrillation and has been on long-term amiodarone therapy. Remotely he had been on Coumadin.  He also has a history of hypertension, a history of intermittent right bundle branch block, as well as a history of non-Hodgkin's abdominal lymphoma. He's had a large B-cell lymphoma involving the bone marrow and a past therapy with CVP/rituximab and also has received Neulasta in the past from Thomas Doyle.  In 2012, I performed cardiac catheterization on 07/03/2011. This revealed normal coronary arteries. He was angiographic evidence of mitral valve prolapse. He had a small abdominal aortic aneurysm.   He has a history of hypertension and has been on losartan 100 mg.  He continues to be on amiodarone 20 mg daily as well as dual antiplatelet therapy with aspirin and Plavix.  He does note occasional extra beats.  His lymphoma has progressed and is Stage 4. He has stopped treatment.  He has made himself a DO NOT RESUSCITATE.  His wife is passed away.  When I saw him he believed that he had less than 6 months to year to live.  Over the past 6 months, he has noticed gradually that he is getting weaker.  He lives with his daughter.  He denies any chest pain.  He has donated his body when he dies to science.  He is no longer following up with his hematology doctor.    When I saw him, he was hypertensive and was maintaining sinus rhythm on amiodarone 200 mg, digoxin 0.125 mg, Toprol-XL 25g, and losartan 100 mg. He was bradycardic.  I discontinued digoxin and added amlodipine 5 mg to his medical regimen.he has felt well.    When I last saw him , he was back in atrial  fibrillation.  He did not want to initiate anticoagulation therapy.  At that time, we further titrated amiodarone to 300 mg daily.  Since I last saw him, he was hospitalized for 2 days in June 2018 with community-acquired pneumonia.  During that hospitalization apparently, he was taken off losartan, metoprolol and amlodipine due to low blood pressure and bradycardia.  When he was seen in follow-up by Thomas Doyle on 01/13/2017 low-dose beta blocker was reinstituted.  Presently, he denies any episodes of chest pain.  He is unaware of palpitations.  He denies presyncope or syncope.  He has not been on treatment for his stage IV lymphoma.  He presents for evaluation.  Past Medical History:  Diagnosis Date  . AAA (abdominal aortic aneurysm) (HCC)    intrarenal  . Anemia   . COPD (chronic obstructive pulmonary disease) [facility]) dx April 2013  . Hypertension   . Large B-cell lymphoma ([facility]) 11/2011  . Mitral valve prolapse   . Non Hodgkin's lymphoma ([place])   . PAF (paroxysmal atrial fibrillation) (HCC)    On amiodarone, digoxin. Not on coumadin  . Pneumonia April 2013  . Right bundle branch block (RBBB)   . Sick sinus syndrome [facility])     Past Surgical History:  Procedure Laterality Date  . abdominal aorta aneurysm duplex  11/20/2012   normal - demonstrates normal taper  .  CARDIAC CATHETERIZATION  09/02/2010   normal LV function, normal coronary arteries, irregularity of the distal aorta with a small aneursymal dilatation  . CARDIOVASCULAR STRESS TEST  08/20/2010   evidence of mild-moderate ischemia in Basal inferoseptal, Basal inferior, Mid inferoseptal and Mid inferior regions. EKG negative for ischemia. Patient developed RBBB during exercise and transient ventricular bigeminal rhythm  . CHEST TUBE INSERTION  1990's   fell  thru deck, punctured lung, chest tube  . CHOLECYSTECTOMY  1980's  . CIRCUMCISION  age 75  . DOPPLER ECHOCARDIOGRAPHY  04/05/2012   EF 63%, borderline MVP with mild mitral regurg  .  TONSILLECTOMY      No Known Allergies  Current Outpatient Prescriptions  Medication Sig Dispense Refill  . amiodarone (PACERONE) 200 MG tablet Take 1.5 tablets (300 mg total) by mouth daily. 45 tablet 10  . aspirin 81 MG tablet Take 81 mg by mouth daily.    . clopidogrel (PLAVIX) 75 MG tablet Take 1 tablet (75 mg total) by mouth daily. 90 tablet 4  . fish oil-omega-3 fatty acids 1000 MG capsule Take 1 g by mouth daily.    [name] guaiFENesin (MUCINEX) 600 MG 12 hr tablet Take 1 tablet (600 mg total) by mouth 2 (two) times daily. 30 tablet 0  . metoprolol succinate (TOPROL-XL) 25 MG 24 hr tablet Take 1 tablet (25 mg total) by mouth daily. 90 tablet 4  . vitamin B-12 (CYANOCOBALAMIN) 1000 MCG tablet Take 1,000 mcg by mouth daily.    [name] zolpidem (AMBIEN) 5 MG tablet TAKE ONE TABLET BY MOUTH AT BEDTIME AS NEEDED FOR SLEEP 30 tablet 0  . amLODipine (NORVASC) 5 MG tablet Take 1 tablet (5 mg total) by mouth daily. 90 tablet 3   No current facility-administered medications for this visit.     Social History   Social History  . Marital status: Widowed    Spouse name: N/A  . Number of children: 2  . Years of education: 14   Occupational History  . Retired from [employer]  . Smoking status: Never Smoker  . Smokeless tobacco: Never Used  . Alcohol use No  . Drug use: No  . Sexual activity: Not on file   Other Topics Concern  . Not on file   Social History Narrative   Widowed x 3 years.  Lives alone.  Independent of ADLs and ambulation.  Emergency Contact: Thomas Doyle ([place]): [phone]. Has two daughters. Still living at home, doesn't use cane/walker.              Family History  Problem Relation Age of Onset  . Atrial fibrillation Mother   . Cancer Father   . Heart attack Father    ROS General: Negative; No fevers, chills, or night sweats;  HEENT: Negative; No changes in vision or hearing, sinus congestion, difficulty swallowing Pulmonary: Negative; No  cough, wheezing, shortness of breath, hemoptysis Cardiovascular: Negative; No chest pain, presyncope, syncope, palpitations GI: Negative; No nausea, vomiting, diarrhea, or abdominal pain GU: Positive for prostatic hypertrophy Musculoskeletal: Negative; no myalgias, joint pain, or weakness Hematologic/Oncology: Positive for non-Hodgkin's lymphoma stage IV Endocrine: Negative; no heat/cold intolerance; no diabetes Neuro: Negative; no changes in balance, headaches Skin: Negative; No rashes or skin lesions Psychiatric: Negative; No behavioral problems, depression Sleep: Negative; No snoring, daytime sleepiness, hypersomnolence, bruxism, restless legs, hypnogognic hallucinations, no cataplexy Other comprehensive 14 point system review is negative.   PE BP (!) 184/73 (BP Location: Right Arm)  Pulse (!) 50   Ht '5\' 10"'$  (1.778 m)   Wt 166 lb 6.4 oz (75.5 kg)   BMI 23.88 kg/m    Repeat blood pressure is 180/75  Wt Readings from Last 3 Encounters:  04/21/17 166 lb 6.4 oz (75.5 kg)  01/13/17 160 lb 9.6 oz (72.8 kg)  12/27/16 158 lb (71.7 kg)   General: Alert, oriented, no distress.  Skin: normal turgor, no rashes, warm and dry HEENT: Normocephalic, atraumatic. Pupils equal round and reactive to light; sclera anicteric; extraocular muscles intact;  Nose without nasal septal hypertrophy Mouth/Parynx benign; Mallinpatti scale 2 Neck: No JVD, no carotid bruits; normal carotid upstroke Lungs: clear to ausculatation and percussion; no wheezing or rales Chest wall: without tenderness to palpitation Heart: PMI not displaced, RRR, s1 s2  with split S1.  Early systolic click.  2/6 systolic murmur radiating to his apex.  No diastolic murmur.  No rubs thrills or heaves. Abdomen: soft, nontender; no hepatosplenomehaly, BS+; abdominal aorta nontender and not dilated by palpation. Back: no CVA tenderness Pulses 2+ Musculoskeletal: full range of motion, normal strength, no joint  deformities Extremities: no clubbing cyanosis or edema, Homan's sign negative  Neurologic: grossly nonfocal; Cranial nerves grossly wnl Psychologic: Normal mood and affect    ECG (independently read by me): Sinus bradycardia 50 bpm.  Right bundle-branch block, left anterior hemiblock.  QTc interval 446 ms.  No ectopy.  December 2017 ECG (independently read by me): trial fibrillation with ventricular rate at 80.  Right bundle branch block.  September 2017 ECG (independently read by me): Ms. bradycardia at 49 bpm.  Right bundle-branch block and probable left anterior hemiblock.  February 2019 ECG (independently read by me): Sinus rhythm at 75 bpm, PVCs with transient ventricular trigeminy  ECG: Sinus rhythm with PVC. Right bundle branch block with repolarization changes.  LABS:  BMP Latest Ref Rng & Units 01/13/2017 12/29/2016 12/28/2016  Glucose 65 - 99 mg/dL 96 97 103(H)  BUN 8 - 27 mg/dL '21 17 16  '$ Creatinine 0.76 - 1.27 mg/dL 1.37(H) 1.32(H) 1.24  BUN/Creat Ratio 10 - 24 15 - -  Sodium 134 - 144 mmol/L 140 139 140  Potassium 3.5 - 5.2 mmol/L 5.3(H) 4.0 4.6  Chloride 96 - 106 mmol/L 103 108 107  CO2 20 - 29 mmol/L '22 22 26  '$ Calcium 8.6 - 10.2 mg/dL 9.3 8.2(L) 8.2(L)    Hepatic Function Latest Ref Rng & Units 12/27/2016 09/12/2014 04/19/2013  Total Protein 6.5 - 8.1 g/dL 6.2(L) 6.5 6.4  Albumin 3.5 - 5.0 g/dL 4.0 4.5 3.9  AST 15 - 41 U/L '23 10 13  '$ ALT 17 - 63 U/L '31 10 12  '$ Alk Phosphatase 38 - 126 U/L 89 83 95  Total Bilirubin 0.3 - 1.2 mg/dL 0.9 0.7 1.04    CBC Latest Ref Rng & Units 01/13/2017 12/29/2016 12/28/2016  WBC 3.4 - 10.8 x10E3/uL 5.0 5.8 9.1  Hemoglobin 13.0 - 17.7 g/dL 12.5(L) 9.7(L) 9.7(L)  Hematocrit 37.5 - 51.0 % 37.8 30.0(L) 30.0(L)  Platelets 150 - 379 x10E3/uL 256 156 151    Lab Results  Component Value Date   MCV 96 01/13/2017   MCV 96.5 12/29/2016   MCV 95.8 12/28/2016    BNP No results found for: PROBNP  Lipid Panel  No results found for:  CHOL   RADIOLOGY: No results found.  IMPRESSION: 1. Paroxysmal atrial fibrillation (HCC)   2. Essential hypertension   3. Right bundle branch block   4. Left anterior hemiblock  5. Large B-cell lymphoma ([place])   6. Medication management     ASSESSMENT AND PLAN: Mr. Shakur Lembo is an 83 year old gentleman underwent cardiac catheterization in 2012 which revealed normal coronary arteries, mitral valve prolapse, and evidence for small abdominal aortic aneurysm. A duplex study of his aorta in April 2014 showed normal taper without significant aneurysmal dilatation. His last echo Doppler study in September 2013 showed normal systolic function with ejection fraction greater than 55%; borderline mitral valve prolapse with mild MR. He does have mild TR. There was mild aortic sclerosis with mild aortic insufficiency.  He has a history of PAF.  When I last saw him he was in atrial fibrillation at that time, further titrated amiodarone.  He has stage IV lymphoma and has withdrawn further treatment.  He is a DO NOT RESUSCITATE.  He will be donating his body to [employer] for [employer].  His blood pressure today is elevated.  Previously he had been on amlodipine as well as losartan.  Remotely, he may have had an issue with elevation of potassium.  Even though he is DO NOT RESUSCITATE, I discussed with him his significant hypertension and potential for increased stroke risk.  He is agreed to reinitiate medication for blood pressure lowering.  I will start amlodipine at 5 mg daily.  He is bradycardic on low-dose Toprol 25 mg daily and has right bundle branch block with left anterior hemiblock.  If he develops further bradycardia, his dose of beta blocker will need to be reduced.  He is agreed for six-month follow-up evaluation.  He will contact me if additional problems arise.    Troy Sine, MD, [facility]  04/21/2017 11:12 AM

## 2017-04-26 DIAGNOSIS — S0500XA Injury of conjunctiva and corneal abrasion without foreign body, unspecified eye, initial encounter: Secondary | ICD-10-CM | POA: Diagnosis not present

## 2017-06-13 ENCOUNTER — Other Ambulatory Visit: Payer: Self-pay | Admitting: *Deleted

## 2017-06-22 DIAGNOSIS — N411 Chronic prostatitis: Secondary | ICD-10-CM | POA: Diagnosis not present

## 2017-06-22 DIAGNOSIS — N400 Enlarged prostate without lower urinary tract symptoms: Secondary | ICD-10-CM | POA: Diagnosis not present

## 2017-06-22 DIAGNOSIS — N489 Disorder of penis, unspecified: Secondary | ICD-10-CM | POA: Diagnosis not present

## 2017-06-22 DIAGNOSIS — N3090 Cystitis, unspecified without hematuria: Secondary | ICD-10-CM | POA: Diagnosis not present

## 2017-06-22 DIAGNOSIS — N4889 Other specified disorders of penis: Secondary | ICD-10-CM | POA: Diagnosis not present

## 2017-06-23 MED ORDER — ZOLPIDEM TARTRATE 5 MG PO TABS: 5.0000 mg | ORAL_TABLET | Freq: Every evening | ORAL | 1 refills | Status: DC | PRN

## 2017-08-09 ENCOUNTER — Other Ambulatory Visit: Payer: Self-pay | Admitting: Cardiovascular Disease

## 2017-08-10 NOTE — Telephone Encounter (Signed)
REFILL 

## 2017-09-08 ENCOUNTER — Other Ambulatory Visit: Payer: Self-pay | Admitting: Cardiovascular Disease

## 2017-09-08 NOTE — Telephone Encounter (Signed)
REFILL 

## 2017-10-07 ENCOUNTER — Other Ambulatory Visit: Payer: Self-pay | Admitting: Cardiovascular Disease

## 2017-10-10 ENCOUNTER — Other Ambulatory Visit: Payer: Self-pay | Admitting: Cardiovascular Disease

## 2017-10-12 NOTE — Telephone Encounter (Signed)
New Message   Prescription said it was printed instead of sent on 10/10/17    *STAT* If patient is at the pharmacy, call can be transferred to refill team.   1. Which medications need to be refilled? (please list name of each medication and dose if known)  zolpidem (AMBIEN) 5 MG tablet TAKE 1 TABLET AT BEDTIME AS NEEDED FOR SLEEP       2. Which pharmacy/location (including street and city if local pharmacy) is medication to be sent to? [facility] [address]   3. Do they need a 30 day or 90 day supply?  [place]

## 2017-10-13 NOTE — Telephone Encounter (Signed)
New Message:    Pt is needing a refill on zolpidem (AMBIEN) 5 MG tablet and [facility] and [facility] has not been [facility].   JMP:no smart phrases yet

## 2017-10-16 ENCOUNTER — Other Ambulatory Visit: Payer: Self-pay | Admitting: Cardiovascular Disease

## 2017-10-20 ENCOUNTER — Other Ambulatory Visit: Payer: Self-pay

## 2017-10-20 MED ORDER — ZOLPIDEM TARTRATE 5 MG PO TABS: 5.0000 mg | ORAL_TABLET | Freq: Every evening | ORAL | 3 refills | Status: DC | PRN

## 2017-11-20 ENCOUNTER — Other Ambulatory Visit: Payer: Self-pay | Admitting: Cardiovascular Disease

## 2017-11-20 NOTE — Telephone Encounter (Signed)
REFILL 

## 2017-11-29 DIAGNOSIS — R05 Cough: Secondary | ICD-10-CM | POA: Diagnosis not present

## 2017-12-11 ENCOUNTER — Emergency Department (HOSPITAL_COMMUNITY): Payer: Medicare Other

## 2017-12-11 ENCOUNTER — Other Ambulatory Visit: Payer: Self-pay

## 2017-12-11 ENCOUNTER — Encounter (HOSPITAL_COMMUNITY): Payer: Self-pay | Admitting: Emergency Medicine

## 2017-12-11 ENCOUNTER — Emergency Department (HOSPITAL_COMMUNITY)
Admission: EM | Admit: 2017-12-11 | Discharge: 2017-12-11 | Disposition: A | Discharge status: Home / Self Care | Payer: Medicare Other | Attending: Emergency Medicine | Admitting: Emergency Medicine

## 2017-12-11 DIAGNOSIS — Z8572 Personal history of non-Hodgkin lymphomas: Secondary | ICD-10-CM | POA: Insufficient documentation

## 2017-12-11 DIAGNOSIS — I10 Essential (primary) hypertension: Secondary | ICD-10-CM | POA: Diagnosis not present

## 2017-12-11 DIAGNOSIS — R05 Cough: Secondary | ICD-10-CM | POA: Diagnosis not present

## 2017-12-11 DIAGNOSIS — Z79899 Other long term (current) drug therapy: Secondary | ICD-10-CM | POA: Insufficient documentation

## 2017-12-11 DIAGNOSIS — J449 Chronic obstructive pulmonary disease, unspecified: Secondary | ICD-10-CM | POA: Insufficient documentation

## 2017-12-11 DIAGNOSIS — J440 Chronic obstructive pulmonary disease with acute lower respiratory infection: Secondary | ICD-10-CM | POA: Diagnosis not present

## 2017-12-11 DIAGNOSIS — Z7982 Long term (current) use of aspirin: Secondary | ICD-10-CM | POA: Insufficient documentation

## 2017-12-11 DIAGNOSIS — R0602 Shortness of breath: Secondary | ICD-10-CM

## 2017-12-11 DIAGNOSIS — Z7902 Long term (current) use of antithrombotics/antiplatelets: Secondary | ICD-10-CM | POA: Insufficient documentation

## 2017-12-11 DIAGNOSIS — C8598 Non-Hodgkin lymphoma, unspecified, lymph nodes of multiple sites: Secondary | ICD-10-CM | POA: Diagnosis not present

## 2017-12-11 DIAGNOSIS — J984 Other disorders of lung: Secondary | ICD-10-CM | POA: Diagnosis not present

## 2017-12-11 DIAGNOSIS — J189 Pneumonia, unspecified organism: Secondary | ICD-10-CM | POA: Diagnosis not present

## 2017-12-11 LAB — CBC
HEMATOCRIT: 36.6 % — AB (ref 39.0–52.0)
HEMOGLOBIN: 11.9 g/dL — AB (ref 13.0–17.0)
MCH: 31.1 pg (ref 26.0–34.0)
MCHC: 32.5 g/dL (ref 30.0–36.0)
MCV: 95.6 fL (ref 78.0–100.0)
Platelets: 359 10*3/uL (ref 150–400)
RBC: 3.83 MIL/uL — ABNORMAL LOW (ref 4.22–5.81)
RDW: 13.1 % (ref 11.5–15.5)
WBC: 7.9 10*3/uL (ref 4.0–10.5)

## 2017-12-11 LAB — BASIC METABOLIC PANEL
Anion gap: 11 (ref 5–15)
BUN: 14 mg/dL (ref 6–20)
CHLORIDE: 100 mmol/L — AB (ref 101–111)
CO2: 23 mmol/L (ref 22–32)
Calcium: 9 mg/dL (ref 8.9–10.3)
Creatinine, Ser: 1.44 mg/dL — ABNORMAL HIGH (ref 0.61–1.24)
GFR calc non Af Amer: 42 mL/min — ABNORMAL LOW (ref >60)
GFR, EST AFRICAN AMERICAN: 49 mL/min — AB (ref >60)
GLUCOSE: 104 mg/dL — AB (ref 65–99)
POTASSIUM: 4.4 mmol/L (ref 3.5–5.1)
SODIUM: 134 mmol/L — AB (ref 135–145)

## 2017-12-11 LAB — I-STAT CG4 LACTIC ACID, ED: LACTIC ACID, VENOUS: 1.13 mmol/L (ref 0.5–1.9)

## 2017-12-11 LAB — I-STAT TROPONIN, ED: Troponin i, poc: 0 ng/mL (ref 0.00–0.08)

## 2017-12-11 MED ORDER — IPRATROPIUM-ALBUTEROL 0.5-2.5 (3) MG/3ML IN SOLN
3.0000 mL | Freq: Once | RESPIRATORY_TRACT | Status: AC
  Administered 2017-12-11: 3 mL via RESPIRATORY_TRACT
  Filled 2017-12-11: qty 3

## 2017-12-11 MED ORDER — IOHEXOL 300 MG/ML  SOLN
75.0000 mL | Freq: Once | INTRAMUSCULAR | Status: AC | PRN
  Administered 2017-12-11: 100 mL via INTRAVENOUS

## 2017-12-11 MED ORDER — HYDROCODONE-HOMATROPINE 5-1.5 MG/5ML PO SYRP: 5.0000 mL | ORAL_SOLUTION | Freq: Four times a day (QID) | ORAL | 0 refills | Status: DC | PRN

## 2017-12-11 MED ORDER — DOXYCYCLINE HYCLATE 100 MG PO CAPS: 100.0000 mg | ORAL_CAPSULE | Freq: Two times a day (BID) | ORAL | 0 refills | Status: DC

## 2017-12-11 MED ORDER — HYDROCODONE-HOMATROPINE 5-1.5 MG/5ML PO SYRP
5.0000 mL | ORAL_SOLUTION | Freq: Once | ORAL | Status: AC
  Administered 2017-12-11: 5 mL via ORAL
  Filled 2017-12-11: qty 5

## 2017-12-11 NOTE — ED Provider Notes (Signed)
[facility]ERG[facility] DEPARTMENT Provider Note   CSN: [id number] Arrival date & time: 12/11/17  1424     History   Chief Complaint Chief Complaint  Patient presents with  . Shortness of Breath    HPI Thomas Doyle is a 84 y.o. male.  The history is provided by the patient and a relative.  Cough  This is a new problem. The current episode started more than 1 week ago. The problem occurs every few minutes. The problem has not changed since onset.The cough is productive of sputum. There has been no fever. Associated symptoms include chills. Pertinent negatives include no chest pain, no ear pain, no sore throat and no shortness of breath. He has tried decongestants, an opioid and cough syrup for the symptoms. The treatment provided no relief. He is not a smoker. His past medical history is significant for COPD.    Past Medical History:  Diagnosis Date  . AAA (abdominal aortic aneurysm) (HCC)    intrarenal  . Anemia   . COPD (chronic obstructive pulmonary disease) [facility]) dx April 2013  . Hypertension   . Large B-cell lymphoma ([place]) 11/2011  . Mitral valve prolapse   . Non Hodgkin's lymphoma ([place])   . PAF (paroxysmal atrial fibrillation) (HCC)    On amiodarone, digoxin. Not on coumadin  . Pneumonia April 2013  . Right bundle branch block (RBBB)   . Sick sinus syndrome [facility])     Patient Active Problem List   Diagnosis Date Noted  . Community acquired pneumonia 12/27/2016  . Hyponatremia 12/27/2016  . PVC's (premature ventricular contractions) 09/14/2014  . Paroxysmal atrial fibrillation ([place]) 06/23/2013  . Right bundle branch block 06/23/2013  . Mitral valve prolapse 06/23/2013  . Neutropenia ([place]) 03/16/2012  . Constipation 12/09/2011  . Hypercalcemia of malignancy 12/08/2011  . Normocytic anemia 11/23/2011  . Thrombocytopenia ([place]) 11/23/2011  . Acute renal failure ([place]) 11/23/2011  . Dehydration 11/23/2011  . Hyperbilirubinemia 11/23/2011  .  Essential hypertension 11/23/2011  . COPD (chronic obstructive pulmonary disease) ([place]) 11/23/2011  . UTI (lower urinary tract infection) 11/23/2011  . Large B-cell lymphoma ([place]) 11/23/2011    Past Surgical History:  Procedure Laterality Date  . abdominal aorta aneurysm duplex  11/20/2012   normal - demonstrates normal taper  . CARDIAC CATHETERIZATION  09/02/2010   normal LV function, normal coronary arteries, irregularity of the distal aorta with a small aneursymal dilatation  . CARDIOVASCULAR STRESS TEST  08/20/2010   evidence of mild-moderate ischemia in Basal inferoseptal, Basal inferior, Mid inferoseptal and Mid inferior regions. EKG negative for ischemia. Patient developed RBBB during exercise and transient ventricular bigeminal rhythm  . CHEST TUBE INSERTION  1990's   fell  thru deck, punctured lung, chest tube  . CHOLECYSTECTOMY  1980's  . CIRCUMCISION  age 14  . DOPPLER ECHOCARDIOGRAPHY  04/05/2012   EF 63%, borderline MVP with mild mitral regurg  . TONSILLECTOMY          Home Medications    Prior to Admission medications   Medication Sig Start Date End Date Taking? Authorizing Provider  amiodarone (PACERONE) 200 MG tablet TAKE ONE AND ONE-HALF (1 & 1/2) TABLET BY MOUTH DAILY Patient taking differently: TAKE ONE AND ONE-HALF (300mg ) TABLET BY MOUTH DAILY 11/20/17  Yes Troy Sine, MD  amLODipine (NORVASC) 5 MG tablet Take 1 tablet (5 mg total) by mouth daily. 04/21/17 12/11/17 Yes Troy Sine, MD  aspirin 81 MG tablet Take 81 mg by mouth daily.  Yes [provider]  clopidogrel (PLAVIX) 75 MG tablet TAKE ONE TABLET BY MOUTH DAILY 11/20/17  Yes Troy Sine, MD  fish oil-omega-3 fatty acids 1000 MG capsule Take 1 g by mouth daily.   Yes [provider]  guaiFENesin (MUCINEX) 600 MG 12 hr tablet Take 1 tablet (600 mg total) by mouth 2 (two) times daily. 12/29/16  Yes Rai, Ripudeep K, MD  metoprolol succinate (TOPROL-XL) 25 MG 24 hr tablet TAKE ONE  TABLET BY MOUTH DAILY 09/08/17  Yes Troy Sine, MD  tamsulosin (FLOMAX) 0.4 MG CAPS capsule Take 0.4 mg by mouth See admin instructions. 5 times weekly 11/11/17  Yes [provider]  vitamin B-12 (CYANOCOBALAMIN) 1000 MCG tablet Take 1,000 mcg by mouth daily.   Yes [provider]  zolpidem (AMBIEN) 5 MG tablet Take 1 tablet (5 mg total) by mouth at bedtime as needed. for sleep Patient taking differently: Take 1 mg by mouth at bedtime. for sleep 10/20/17  Yes Troy Sine, MD  doxycycline (VIBRAMYCIN) 100 MG capsule Take 1 capsule (100 mg total) by mouth 2 (two) times daily. 12/11/17   Clifton James, MD  HYDROcodone-homatropine [facility]) 5-1.5 MG/5ML syrup Take 5 mLs by mouth every 6 (six) hours as needed for cough. 12/11/17   Clifton James, MD    Family History Family History  Problem Relation Age of Onset  . Atrial fibrillation Mother   . Cancer Father   . Heart attack Father     Social History Social History   Tobacco Use  . Smoking status: Never Smoker  . Smokeless tobacco: Never Used  Substance Use Topics  . Alcohol use: No  . Drug use: No     Allergies   Codeine   Review of Systems Review of Systems  Constitutional: Positive for chills. Negative for fever.  HENT: Negative for ear pain and sore throat.   Eyes: Negative for pain and visual disturbance.  Respiratory: Positive for cough. Negative for shortness of breath.   Cardiovascular: Negative for chest pain and palpitations.  Gastrointestinal: Negative for abdominal pain and vomiting.  Genitourinary: Negative for dysuria and hematuria.  Musculoskeletal: Negative for arthralgias and back pain.  Skin: Negative for color change and rash.  Neurological: Negative for seizures and syncope.  All other systems reviewed and are negative.    Physical Exam Updated Vital Signs BP (!) 142/65   Pulse 79   Temp 97.9 F (36.6 C) (Oral)   Resp (!) 22   SpO2 94%   Physical Exam    Constitutional: He appears well-developed and well-nourished.  HENT:  Head: Normocephalic and atraumatic.  Eyes: Conjunctivae are normal.  Neck: Neck supple.  Cardiovascular: Normal rate and regular rhythm.  No murmur heard. Pulmonary/Chest: Effort normal and breath sounds normal. No respiratory distress.  Frequent dry, shallow cough, nonproductive  Abdominal: Soft. There is no tenderness.  Musculoskeletal: He exhibits no edema.  Neurological: He is alert.  Skin: Skin is warm and dry.  Psychiatric: He has a normal mood and affect.  Nursing note and vitals reviewed.    ED Treatments / Results  Labs (all labs ordered are listed, but only abnormal results are displayed) Labs Reviewed  CBC - Abnormal; Notable for the following components:      Result Value   RBC 3.83 (*)    Hemoglobin 11.9 (*)    HCT 36.6 (*)    All other components within normal limits  BASIC METABOLIC PANEL - Abnormal; Notable for  the following components:   Sodium 134 (*)    Chloride 100 (*)    Glucose, Bld 104 (*)    Creatinine, Ser 1.44 (*)    GFR calc non Af Amer 42 (*)    GFR calc Af Amer 49 (*)    All other components within normal limits  I-STAT TROPONIN, ED  I-STAT CG4 LACTIC ACID, ED    EKG None  Radiology Dg Chest 2 View  Result Date: 12/11/2017 CLINICAL DATA:  Three-week history of shortness of breath. EXAM: CHEST - 2 VIEW COMPARISON:  01/13/2017 FINDINGS: Lungs are hyperexpanded with interstitial prominence at the bases. Patchy airspace opacity at the right base is new in the interval no airspace pulmonary edema or pleural effusion. Biapical pleuroparenchymal scarring is similar. The cardiopericardial silhouette is within normal limits for size. The visualized bony structures of the thorax are intact. Nodular density/densities projecting over the lungs are compatible with pads for telemetry leads. IMPRESSION: 1. Patchy basilar airspace disease in the right lung suggests pneumonia. 2.  Emphysema with chronic interstitial coarsening. Electronically Signed   By: Misty Stanley M.D.   On: 12/11/2017 17:01   Ct Chest W Contrast  Result Date: 12/11/2017 CLINICAL DATA:  Increased weakness with shortness of breath and cough EXAM: CT CHEST WITH CONTRAST TECHNIQUE: Multidetector CT imaging of the chest was performed during intravenous contrast administration. CONTRAST:  141mL OMNIPAQUE IOHEXOL 300 MG/ML  SOLN COMPARISON:  Chest x-ray 12/11/2017, PET-CT 04/26/2012 FINDINGS: Cardiovascular: Nonaneurysmal aorta. Moderate aortic atherosclerosis. Normal heart size. No pericardial effusion Mediastinum/Nodes: Midline trachea. No thyroid mass. No significant adenopathy. Esophagus within normal limits Lungs/Pleura: Bronchiectasis within the upper, right middle, and lower lobes. Scattered blebs. Mild subpleural reticulation at the bases consistent with fibrosis. Scattered foci of ground-glass density, most notable in the right lower lobe with smaller foci in the left upper lobe and superior segment of left lower lobe. Biapical pleural and parenchymal scarring, no change. No pneumothorax. Upper Abdomen: Surgical clips in the gallbladder fossa. Enlarged extrahepatic bile duct up to 15 mm, likely surgical change. No acute abnormality. Musculoskeletal: Old rib fractures. No acute or suspicious abnormality IMPRESSION: 1. Multifocal bronchiectasis and scattered areas of subpleural fibrosis. Scattered foci of ground-glass density, greatest in the right lower and left upper lobes suggesting small foci of pneumonitis or infection. 2. Biapical right greater than left pleural and parenchymal scarring Aortic Atherosclerosis (ICD10-I70.0). Electronically Signed   By: Donavan Foil M.D.   On: 12/11/2017 21:13    Procedures Procedures (including critical care time)  Medications Ordered in ED Medications  ipratropium-albuterol (DUONEB) 0.5-2.5 (3) MG/3ML nebulizer solution 3 mL (3 mLs Nebulization Given 12/11/17 1641)   HYDROcodone-homatropine (HYCODAN) 5-1.5 MG/5ML syrup 5 mL (5 mLs Oral Given 12/11/17 1920)  iohexol (OMNIPAQUE) 300 MG/ML solution 75 mL (100 mLs Intravenous Contrast Given 12/11/17 2034)     Initial Impression / Assessment and Plan / ED Course  I have reviewed the triage vital signs and the nursing notes.  Pertinent labs & imaging results that were available during my care of the patient were reviewed by me and considered in my medical decision making (see chart for details).    Patient is an 84 year old male with history as above who presents due to cough for 3 to 4 weeks.  He has been on Levaquin and completed a one-week course about a week ago.  He has tried multiple different cough medications.  He continues to have a dry, nonproductive cough.  He previously was getting some sputum  production.  He denies any fevers but does endorse chills.  Here, patient is well-appearing.  Chest x-ray showed possible right sided infiltrate.  Given his ongoing cough and his history of cancer, CT scan was performed.  This did show 2 small areas of possible residual infection.  His laboratory evaluation is notable for mildly elevated creatinine and mild anemia, but otherwise unremarkable.  He had improvement here of his cough with Hycodan.  We will place him on doxycycline for a 10-day course and also prescribe Hycodan.  He has just recently established with a primary care physician.  I advised him to follow-up in 1 week for reevaluation.  He appears stable for discharge at this time.  He and his daughter are in agreement with this plan.  Return precautions were discussed in detail.  Patient discharged in stable condition.  Final Clinical Impressions(s) / ED Diagnoses   Final diagnoses:  Shortness of breath  Community acquired pneumonia, unspecified laterality    ED Discharge Orders        Ordered    HYDROcodone-homatropine (HYCODAN) 5-1.5 MG/5ML syrup  Every 6 hours PRN     12/11/17 2146    doxycycline  (VIBRAMYCIN) 100 MG capsule  2 times daily     12/11/17 2146       Clifton James, MD 12/11/17 2151    Merrily Pew, MD 12/12/17 [phone]

## 2017-12-11 NOTE — ED Notes (Signed)
PT states understanding of care given, follow up care, and medication prescribed. PT ambulated from ED to car with a steady gait. 

## 2017-12-11 NOTE — ED Provider Notes (Signed)
Patient placed in Quick Look pathway, seen and evaluated   Chief Complaint: sob and nausea  HPI:  With hx of non hodgkins lymphoma, not on any treatment at this time.  Pt reports sob and cough for 3 weeks. States cough is dry. Worse on exertion. Tried hydrocodeine cough syrup which made him nauseated. Finished a course of antibiotics a week and half ago. Pt reports generalized weakness. Not eating. Unable to ambulate easily.   ROS: cough, sob, weakness, weight loss  Physical Exam:   Gen: No distress  Neuro: Awake and Alert  Skin: Warm    Focused Exam: Patient is constantly coughing.  Rhonchorous lung sounds bilaterally.  No wheezing.  No rales.  No peripheral edema.  Regular heart rate and rhythm.   Initiation of care has begun. The patient has been counseled on the process, plan, and necessity for staying for the completion/evaluation, and the remainder of the medical screening examination   Patient with cough for 3 weeks, shortness of breath, generalized weakness, loss of appetite, weight loss.  He does have stage IV non-Hodgkin's lymphoma, and currently not on any treatment.  His vital signs are normal.  Will check x-ray and labs.  I will give him a breathing treatment to see if it will help his cough.  Finished antibiotics a week and a half ago as well as cough medicine which is not helping.  Vitals:   12/11/17 1545  BP: 140/77  Pulse: 74  Resp: 16  Temp: 97.9 F (36.6 C)  TempSrc: Oral  SpO2: 97%      Jeannett Senior, PA-C 12/11/17 1633    Mesner, Corene Cornea, MD 12/12/17 6712

## 2017-12-11 NOTE — ED Notes (Signed)
Pt taken RN

## 2017-12-11 NOTE — ED Triage Notes (Signed)
Patient presents to ED for assessment of SOB x 2 weeks, 1 round of abx through urgent care, no resolution, with dry cough.  Pt has hx of non-hogdkins lymphoma which he chose not to do treatment for.  Recent spread to bladder, and patient states his goal is "just to feel a little better" and "stop coughing".  Patient states dyspnea on exertion.

## 2017-12-29 DIAGNOSIS — Z23 Encounter for immunization: Secondary | ICD-10-CM | POA: Diagnosis not present

## 2017-12-29 DIAGNOSIS — J181 Lobar pneumonia, unspecified organism: Secondary | ICD-10-CM | POA: Diagnosis not present

## 2017-12-29 DIAGNOSIS — F5101 Primary insomnia: Secondary | ICD-10-CM | POA: Diagnosis not present

## 2017-12-29 DIAGNOSIS — J438 Other emphysema: Secondary | ICD-10-CM | POA: Diagnosis not present

## 2018-02-25 ENCOUNTER — Other Ambulatory Visit: Payer: Self-pay | Admitting: Cardiovascular Disease

## 2018-03-13 ENCOUNTER — Other Ambulatory Visit: Payer: Self-pay | Admitting: Cardiovascular Disease

## 2018-04-20 DIAGNOSIS — I4891 Unspecified atrial fibrillation: Secondary | ICD-10-CM | POA: Diagnosis not present

## 2018-04-20 DIAGNOSIS — Z0000 Encounter for general adult medical examination without abnormal findings: Secondary | ICD-10-CM | POA: Diagnosis not present

## 2018-04-20 DIAGNOSIS — F5101 Primary insomnia: Secondary | ICD-10-CM | POA: Diagnosis not present

## 2018-04-20 DIAGNOSIS — I10 Essential (primary) hypertension: Secondary | ICD-10-CM | POA: Diagnosis not present

## 2018-05-27 ENCOUNTER — Other Ambulatory Visit: Payer: Self-pay | Admitting: Cardiovascular Disease

## 2018-05-28 ENCOUNTER — Other Ambulatory Visit: Payer: Self-pay | Admitting: Cardiovascular Disease

## 2018-06-13 ENCOUNTER — Other Ambulatory Visit: Payer: Self-pay | Admitting: Cardiovascular Disease

## 2018-06-15 DIAGNOSIS — R102 Pelvic and perineal pain: Secondary | ICD-10-CM | POA: Diagnosis not present

## 2018-06-15 DIAGNOSIS — N401 Enlarged prostate with lower urinary tract symptoms: Secondary | ICD-10-CM | POA: Diagnosis not present

## 2018-06-15 DIAGNOSIS — N411 Chronic prostatitis: Secondary | ICD-10-CM | POA: Diagnosis not present

## 2018-06-15 DIAGNOSIS — N138 Other obstructive and reflux uropathy: Secondary | ICD-10-CM | POA: Diagnosis not present

## 2018-06-27 DIAGNOSIS — H5213 Myopia, bilateral: Secondary | ICD-10-CM | POA: Diagnosis not present

## 2018-07-16 ENCOUNTER — Other Ambulatory Visit: Payer: Self-pay | Admitting: Cardiovascular Disease

## 2018-07-30 ENCOUNTER — Other Ambulatory Visit: Payer: Self-pay | Admitting: Cardiovascular Disease

## 2018-08-09 ENCOUNTER — Encounter: Payer: Self-pay | Admitting: Physician Assistant

## 2018-08-09 ENCOUNTER — Ambulatory Visit (INDEPENDENT_AMBULATORY_CARE_PROVIDER_SITE_OTHER): Payer: Medicare Other | Admitting: Physician Assistant

## 2018-08-09 VITALS — BP 148/64 | HR 55 | Ht 70.0 in | Wt 157.0 lb

## 2018-08-09 DIAGNOSIS — I714 Abdominal aortic aneurysm, without rupture, unspecified: Secondary | ICD-10-CM

## 2018-08-09 DIAGNOSIS — C8330 Diffuse large B-cell lymphoma, unspecified site: Secondary | ICD-10-CM

## 2018-08-09 DIAGNOSIS — I10 Essential (primary) hypertension: Secondary | ICD-10-CM | POA: Diagnosis not present

## 2018-08-09 DIAGNOSIS — I480 Paroxysmal atrial fibrillation: Secondary | ICD-10-CM | POA: Diagnosis not present

## 2018-08-09 MED ORDER — AMIODARONE HCL 200 MG PO TABS: ORAL_TABLET | ORAL | 3 refills | Status: DC

## 2018-08-09 MED ORDER — AMLODIPINE BESYLATE 10 MG PO TABS: 10.0000 mg | ORAL_TABLET | Freq: Every day | ORAL | 3 refills | Status: DC

## 2018-08-09 MED ORDER — OMEGA-3-ACID ETHYL ESTERS 1 G PO CAPS: 1.0000 g | ORAL_CAPSULE | Freq: Every day | ORAL | 2 refills | Status: DC

## 2018-08-09 MED ORDER — METOPROLOL SUCCINATE ER 25 MG PO TB24: 25.0000 mg | ORAL_TABLET | Freq: Every day | ORAL | 3 refills | Status: DC

## 2018-08-09 NOTE — Patient Instructions (Signed)
Medication Instructions:  Your physician has recommended you make the following change in your medication:   1) STOP Plavix 2) INCREASE Amlodipine to 10mg  daily. An Rx has been sent to your pharmacy  All of your cardiac medications have been refilled today  If you need a refill on your cardiac medications before your next appointment, please call your pharmacy.   Lab work: None ordered If you have labs (blood work) drawn today and your tests are completely normal, you will receive your results only by: [name] MyChart Message (if you have MyChart) OR . A paper copy in the mail If you have any lab test that is abnormal or we need to change your treatment, we will call you to review the results.  Testing/Procedures: None ordered  Follow-Up: At [facility], you and your health needs are our priority.  As part of our continuing mission to provide you with exceptional heart care, we have created designated Provider Care Teams.  These Care Teams include your primary Cardiologist (physician) and Advanced Practice Providers (APPs -  Physician Assistants and Nurse Practitioners) who all work together to provide you with the care you need, when you need it. You will need a follow up appointment in 12 months.  Please call our office 2 months in advance to schedule this appointment.  You may see Shelva Majestic, MD or one of the following Advanced Practice Providers on your designated Care Team: [place], [place] . Fabian Sharp, PA-C

## 2018-08-09 NOTE — Progress Notes (Addendum)
Cardiology Office Note    Date:  08/11/2018   ID:  Thomas Doyle, DOB 1929-01-17, MRN [id number]  PCP:  No primary care provider on file.  Cardiologist:  Thomas Doyle   Chief Complaint  Patient presents with  . Follow-up    seen for Thomas Doyle.     History of Present Illness:  Thomas Doyle is a 85 y.o. male with PMH of HTN, COPD, large B-cell lymphoma, PAF on amiodarone, AAA, RBBB and SSS. Cardiac catheterization performed on 07/03/2011 revealed normal coronaries, angiographic evidence of mitral valve prolapse. He also had small abdominal aortic aneurysm. Based on office note on 12/76/2017, his lymphoma has progressed to stage IV at the time, he also made himself a DO NOT RESUSCITATE.  Patient was admitted in 2018 with pneumonia and was treated with antibiotic.  He has given up all treatment at this time.  He is no longer on systemic anticoagulation.  I last saw the patient in June 2018, I discontinued his losartan and amlodipine.  Amlodipine was added back later for high blood pressure.  Patient was last seen by Thomas Doyle in September 2018, he did not wish to restart systemic anticoagulation therapy.   Patient presents back to cardiology office visit.  He has been feeling well recently.  He continued to be active at home.  He has established with a new primary care provider since his last office visit.  I have reviewed his medication list, I will discontinue his Plavix as this is unlikely to benefit him at this point.  His blood pressure continue to be elevated, I will increase amlodipine to 10 mg daily.  He is at peace with any changes to his health and chose to live life day by day.  He is a DNR.  He has choose to donate his body to [employer] for medical science after his death.    Past Medical History:  Diagnosis Date  . AAA (abdominal aortic aneurysm) (HCC)    intrarenal  . Anemia   . COPD (chronic obstructive pulmonary disease) [facility]) dx April 2013  . Hypertension   .  Large B-cell lymphoma ([place]) 11/2011  . Mitral valve prolapse   . Non Hodgkin's lymphoma ([place])   . PAF (paroxysmal atrial fibrillation) (HCC)    On amiodarone, digoxin. Not on coumadin  . Pneumonia April 2013  . Right bundle branch block (RBBB)   . Sick sinus syndrome [facility])     Past Surgical History:  Procedure Laterality Date  . abdominal aorta aneurysm duplex  11/20/2012   normal - demonstrates normal taper  . CARDIAC CATHETERIZATION  09/02/2010   normal LV function, normal coronary arteries, irregularity of the distal aorta with a small aneursymal dilatation  . CARDIOVASCULAR STRESS TEST  08/20/2010   evidence of mild-moderate ischemia in Basal inferoseptal, Basal inferior, Mid inferoseptal and Mid inferior regions. EKG negative for ischemia. Patient developed RBBB during exercise and transient ventricular bigeminal rhythm  . CHEST TUBE INSERTION  1990's   fell  thru deck, punctured lung, chest tube  . CHOLECYSTECTOMY  1980's  . CIRCUMCISION  age 44  . DOPPLER ECHOCARDIOGRAPHY  04/05/2012   EF 63%, borderline MVP with mild mitral regurg  . TONSILLECTOMY      Current Medications: Outpatient Medications Prior to Visit  Medication Sig Dispense Refill  . aspirin 81 MG tablet Take 81 mg by mouth daily.    . cholecalciferol (VITAMIN D3) 25 MCG (1000 UT) tablet Take 1,000 Units by  mouth daily.    . tamsulosin (FLOMAX) 0.4 MG CAPS capsule Take 0.4 mg by mouth See admin instructions. 5 times weekly  2  . vitamin B-12 (CYANOCOBALAMIN) 1000 MCG tablet Take 1,000 mcg by mouth daily.    [name] zolpidem (AMBIEN) 5 MG tablet Take 1 tablet (5 mg total) by mouth at bedtime as needed. for sleep (Patient taking differently: Take 1 mg by mouth at bedtime. for sleep) 30 tablet 3  . amiodarone (PACERONE) 200 MG tablet TAKE ONE AND ONE-HALF (1 & 1/2) TABLET BY MOUTH DAILY 135 tablet 0  . clopidogrel (PLAVIX) 75 MG tablet TAKE ONE TABLET BY MOUTH DAILY 90 tablet 0  . fish oil-omega-3 fatty acids 1000 MG capsule  Take 1 g by mouth daily.    . metoprolol succinate (TOPROL-XL) 25 MG 24 hr tablet Take 1 tablet (25 mg total) by mouth daily. ** DO NOT CRUSH **    (BETA BLOCKER) 30 tablet 6  . amLODipine (NORVASC) 5 MG tablet Take 1 tablet (5 mg total) by mouth daily. 90 tablet 3  . doxycycline (VIBRAMYCIN) 100 MG capsule Take 1 capsule (100 mg total) by mouth 2 (two) times daily. 20 capsule 0  . guaiFENesin (MUCINEX) 600 MG 12 hr tablet Take 1 tablet (600 mg total) by mouth 2 (two) times daily. 30 tablet 0  . HYDROcodone-homatropine (HYCODAN) 5-1.5 MG/5ML syrup Take 5 mLs by mouth every 6 (six) hours as needed for cough. 120 mL 0   No facility-administered medications prior to visit.      Allergies:   Codeine   Social History   Socioeconomic History  . Marital status: Widowed    Spouse name: Not on file  . Number of children: 2  . Years of education: 14  . Highest education level: Not on file  Occupational History  . Occupation: Retired from [employer]  . Financial resource strain: Not on file  . Food insecurity:    Worry: Not on file    Inability: Not on file  . Transportation needs:    Medical: Not on file    Non-medical: Not on file  Tobacco Use  . Smoking status: Never Smoker  . Smokeless tobacco: Never Used  Substance and Sexual Activity  . Alcohol use: No  . Drug use: No  . Sexual activity: Not on file  Lifestyle  . Physical activity:    Days per week: Not on file    Minutes per session: Not on file  . Stress: Not on file  Relationships  . Social connections:    Talks on phone: Not on file    Gets together: Not on file    Attends religious service: Not on file    Active member of club or organization: Not on file    Attends meetings of clubs or organizations: Not on file    Relationship status: Not on file  Other Topics Concern  . Not on file  Social History Narrative   Widowed x 3 years.  Lives alone.  Independent of ADLs and ambulation.  Emergency Contact: Thomas Doyle ([place]): [phone]. Has two daughters. Still living at home, doesn't use cane/walker.               Family History:  The patient's family history includes Atrial fibrillation in his mother; Cancer in his father; Heart attack in his father.   ROS:   Please see the history of present illness.    ROS All other systems reviewed and  are negative.   PHYSICAL EXAM:   VS:  BP (!) 148/64   Pulse (!) 55   Ht 5\' 10"  (1.778 m)   Wt 157 lb (71.2 kg)   BMI 22.53 kg/m    GEN: Well nourished, well developed, in no acute distress  HEENT: normal  Neck: no JVD, carotid bruits, or masses Cardiac: RRR; no murmurs, rubs, or gallops,no edema  Respiratory:  clear to auscultation bilaterally, normal work of breathing GI: soft, nontender, nondistended, + BS MS: no deformity or atrophy  Skin: warm and dry, no rash Neuro:  Alert and Oriented x 3, Strength and sensation are intact Psych: euthymic mood, full affect  Wt Readings from Last 3 Encounters:  08/09/18 157 lb (71.2 kg)  04/21/17 166 lb 6.4 oz (75.5 kg)  01/13/17 160 lb 9.6 oz (72.8 kg)      Studies/Labs Reviewed:   EKG:  EKG is ordered today.  The ekg ordered today demonstrates NSR with RBBB  Recent Labs: 12/11/2017: BUN 14; Creatinine, Ser 1.44; Hemoglobin 11.9; Platelets 359; Potassium 4.4; Sodium 134   Lipid Panel No results found for: CHOL, TRIG, HDL, CHOLHDL, VLDL, LDLCALC, LDLDIRECT  Additional studies/ records that were reviewed today include:   Myoview 08/20/2010    ASSESSMENT:    1. Essential hypertension   2. Diffuse large B-cell lymphoma, unspecified body region ([place])   3. PAF (paroxysmal atrial fibrillation) ([place])   4. AAA (abdominal aortic aneurysm) without rupture (HCC)      PLAN:  In order of problems listed above:  1. Hypertension: Blood pressure elevated, increase amlodipine to 10 mg daily.   2. Diffuse large B-cell lymphoma stage IV: He has given up treatment at this point.  3. PAF: Not on  systemic anticoagulation therapy.  Continue aspirin and metoprolol.  Not entirely sure why the patient is on Plavix.  I do not see any indication for this medication and will discontinue.  4. AAA: He has a known abdominal aortic aneurysm, however since he has given up treatment for stage IV cancer, no follow-up imaging is planned at the current time.  He is DNR.  He has also donated his body to [employer] for medical science after his death.   Medication Adjustments/Labs and Tests Ordered: Current medicines are reviewed at length with the patient today.  Concerns regarding medicines are outlined above.  Medication changes, Labs and Tests ordered today are listed in the Patient Instructions below. Patient Instructions  Medication Instructions:  Your physician has recommended you make the following change in your medication:   1) STOP Plavix 2) INCREASE Amlodipine to 10mg  daily. An Rx has been sent to your pharmacy  All of your cardiac medications have been refilled today  If you need a refill on your cardiac medications before your next appointment, please call your pharmacy.   Lab work: None ordered If you have labs (blood work) drawn today and your tests are completely normal, you will receive your results only by: [name] MyChart Message (if you have MyChart) OR . A paper copy in the mail If you have any lab test that is abnormal or we need to change your treatment, we will call you to review the results.  Testing/Procedures: None ordered  Follow-Up: At [facility], you and your health needs are our priority.  As part of our continuing mission to provide you with exceptional heart care, we have created designated Provider Care Teams.  These Care Teams include your primary Cardiologist (physician) and Advanced  Practice Providers (APPs -  Physician Assistants and Nurse Practitioners) who all work together to provide you with the care you need, when you need it. You will need a  follow up appointment in 12 months.  Please call our office 2 months in advance to schedule this appointment.  You may see Shelva Majestic, MD or one of the following Advanced Practice Providers on your designated Care Team: [place], [place] . Fabian Sharp, PA-C         Signed, [place], [place]  08/11/2018 11:58 PM    [facility] [address], [place], [place]  [address] Phone: [phone]; Fax: [phone]

## 2018-08-11 ENCOUNTER — Encounter: Payer: Self-pay | Admitting: Physician Assistant

## 2018-08-14 NOTE — Addendum Note (Signed)
Addended by: Therisa Doyne on: 08/14/2018 05:03 PM   Modules accepted: Orders

## 2018-08-22 ENCOUNTER — Other Ambulatory Visit: Payer: Self-pay | Admitting: Cardiovascular Disease

## 2019-01-28 ENCOUNTER — Other Ambulatory Visit: Payer: Self-pay

## 2019-01-28 MED ORDER — METOPROLOL SUCCINATE ER 25 MG PO TB24: 25.0000 mg | ORAL_TABLET | Freq: Every day | ORAL | 3 refills | Status: DC

## 2019-04-22 DIAGNOSIS — I4891 Unspecified atrial fibrillation: Secondary | ICD-10-CM | POA: Diagnosis not present

## 2019-04-22 DIAGNOSIS — F5101 Primary insomnia: Secondary | ICD-10-CM | POA: Diagnosis not present

## 2019-04-22 DIAGNOSIS — Z0000 Encounter for general adult medical examination without abnormal findings: Secondary | ICD-10-CM | POA: Diagnosis not present

## 2019-04-22 DIAGNOSIS — I10 Essential (primary) hypertension: Secondary | ICD-10-CM | POA: Diagnosis not present

## 2019-04-29 DIAGNOSIS — L299 Pruritus, unspecified: Secondary | ICD-10-CM | POA: Diagnosis not present

## 2019-04-29 DIAGNOSIS — Z23 Encounter for immunization: Secondary | ICD-10-CM | POA: Diagnosis not present

## 2019-04-29 DIAGNOSIS — L570 Actinic keratosis: Secondary | ICD-10-CM | POA: Diagnosis not present

## 2019-04-29 DIAGNOSIS — D485 Neoplasm of uncertain behavior of skin: Secondary | ICD-10-CM | POA: Diagnosis not present

## 2019-04-29 DIAGNOSIS — C44320 Squamous cell carcinoma of skin of unspecified parts of face: Secondary | ICD-10-CM | POA: Diagnosis not present

## 2019-06-11 ENCOUNTER — Other Ambulatory Visit: Payer: Self-pay | Admitting: Physician Assistant

## 2019-08-06 ENCOUNTER — Telehealth: Payer: Self-pay | Admitting: Physician Assistant

## 2019-08-06 NOTE — Telephone Encounter (Signed)
Daughter aware okay to accompany pt to appt ./cy

## 2019-08-06 NOTE — Telephone Encounter (Signed)
New Message:   Daughter says she will need to come in with pt for his appt on Friday(08-09-19) to see Isaac Laud. She says pt have problem with walking.

## 2019-08-09 ENCOUNTER — Encounter: Payer: Self-pay | Admitting: Physician Assistant

## 2019-08-09 ENCOUNTER — Other Ambulatory Visit: Payer: Self-pay

## 2019-08-09 ENCOUNTER — Ambulatory Visit: Payer: Medicare Other | Admitting: Physician Assistant

## 2019-08-09 VITALS — BP 118/50 | HR 57 | Temp 97.2°F | Ht 70.0 in | Wt 159.0 lb

## 2019-08-09 DIAGNOSIS — I10 Essential (primary) hypertension: Secondary | ICD-10-CM | POA: Diagnosis not present

## 2019-08-09 DIAGNOSIS — I714 Abdominal aortic aneurysm, without rupture, unspecified: Secondary | ICD-10-CM

## 2019-08-09 DIAGNOSIS — C8330 Diffuse large B-cell lymphoma, unspecified site: Secondary | ICD-10-CM

## 2019-08-09 DIAGNOSIS — I480 Paroxysmal atrial fibrillation: Secondary | ICD-10-CM

## 2019-08-09 MED ORDER — AMLODIPINE BESYLATE 10 MG PO TABS: 10.0000 mg | ORAL_TABLET | Freq: Every day | ORAL | 3 refills | Status: DC

## 2019-08-09 MED ORDER — AMIODARONE HCL 200 MG PO TABS: ORAL_TABLET | ORAL | 3 refills | Status: DC

## 2019-08-09 MED ORDER — OMEGA-3-ACID ETHYL ESTERS 1 G PO CAPS: 1.0000 | ORAL_CAPSULE | Freq: Every day | ORAL | 3 refills | Status: DC

## 2019-08-09 MED ORDER — METOPROLOL SUCCINATE ER 25 MG PO TB24: 25.0000 mg | ORAL_TABLET | Freq: Every day | ORAL | 3 refills | Status: DC

## 2019-08-09 NOTE — Progress Notes (Signed)
Cardiology Office Note:    Date:  08/11/2019   ID:  Thomas Doyle, DOB 06-Aug-1928, MRN [id number]  PCP:  Leeroy Cha, MD  Cardiologist:  Shelva Majestic, MD  Electrophysiologist:  None   Referring MD: No ref. provider found   Chief Complaint  Patient presents with  . Follow-up    seen for Dr. Claiborne Billings.     History of Present Illness:    Thomas Doyle is a 86 y.o. male with a hx of HTN, COPD, large B-cell lymphoma, PAF on amiodarone, AAA, RBBB and SSS.Cardiac catheterization performed on 07/03/2011 revealed normal coronaries, angiographic evidence of mitral valve prolapse. He also had small abdominal aortic aneurysm. Based on office note on 06/30/2016, his lymphoma has progressed to stage IV at the time, he also made himself a DO NOT RESUSCITATE.  Patient was admitted in 2018 with pneumonia and was treated with antibiotic.  He has given up all treatment at this time.  He is no longer on systemic anticoagulation.  I last saw the patient in June 2018, I discontinued his losartan and amlodipine.  Amlodipine was added back later for high blood pressure.  Patient was last seen by Dr. Claiborne Billings in September 2018, he did not wish to restart systemic anticoagulation therapy.   I last saw the patient in January 2020 at which time he was doing well.  I discontinued his Plavix at the time.  Amlodipine was increased to 10 mg daily to help with blood pressure.  He mentioned that he chose to donate his body to [employer] for medical science after his death.  Patient presents today for cardiology office visit along with her daughter.  I have filled out his disability placard.  He denies any chest pain, lower extremity edema, orthopnea or PND.  I have refilled his medications.  I discussed with the patient whether or not to obtain lab work at this time, he opted not to proceed with any lab work as he is near the end of his life.   Past Medical History:  Diagnosis Date  . AAA (abdominal  aortic aneurysm) (HCC)    intrarenal  . Anemia   . COPD (chronic obstructive pulmonary disease) [facility]) dx April 2013  . Hypertension   . Large B-cell lymphoma ([place]) 11/2011  . Mitral valve prolapse   . Non Hodgkin's lymphoma ([place])   . PAF (paroxysmal atrial fibrillation) (HCC)    On amiodarone, digoxin. Not on coumadin  . Pneumonia April 2013  . Right bundle branch block (RBBB)   . Sick sinus syndrome [facility])     Past Surgical History:  Procedure Laterality Date  . abdominal aorta aneurysm duplex  11/20/2012   normal - demonstrates normal taper  . CARDIAC CATHETERIZATION  09/02/2010   normal LV function, normal coronary arteries, irregularity of the distal aorta with a small aneursymal dilatation  . CARDIOVASCULAR STRESS TEST  08/20/2010   evidence of mild-moderate ischemia in Basal inferoseptal, Basal inferior, Mid inferoseptal and Mid inferior regions. EKG negative for ischemia. Patient developed RBBB during exercise and transient ventricular bigeminal rhythm  . CHEST TUBE INSERTION  1990's   fell  thru deck, punctured lung, chest tube  . CHOLECYSTECTOMY  1980's  . CIRCUMCISION  age 28  . DOPPLER ECHOCARDIOGRAPHY  04/05/2012   EF 63%, borderline MVP with mild mitral regurg  . TONSILLECTOMY      Current Medications: Current Meds  Medication Sig  . amiodarone (PACERONE) 200 MG tablet TAKE ONE AND ONE-HALF (1 &  1/2) TABLET BY MOUTH DAILY  . amLODipine (NORVASC) 10 MG tablet Take 1 tablet (10 mg total) by mouth daily.  [name] aspirin 81 MG tablet Take 81 mg by mouth daily.  . cholecalciferol (VITAMIN D3) 25 MCG (1000 UT) tablet Take 1,000 Units by mouth daily.  . metoprolol succinate (TOPROL-XL) 25 MG 24 hr tablet Take 1 tablet (25 mg total) by mouth daily. ** DO NOT CRUSH **    (BETA BLOCKER)  . omega-3 acid ethyl esters (LOVAZA) 1 g capsule Take 1 capsule (1 g total) by mouth daily.  . tamsulosin (FLOMAX) 0.4 MG CAPS capsule Take 0.4 mg by mouth See admin instructions. 5 times weekly  .  vitamin B-12 (CYANOCOBALAMIN) 1000 MCG tablet Take 1,000 mcg by mouth daily.  [name] zolpidem (AMBIEN) 5 MG tablet Take 1 tablet (5 mg total) by mouth at bedtime as needed. for sleep (Patient taking differently: Take 1 mg by mouth at bedtime. for sleep)  . [DISCONTINUED] amiodarone (PACERONE) 200 MG tablet TAKE ONE AND ONE-HALF (1 & 1/2) TABLET BY MOUTH DAILY  . [DISCONTINUED] amLODipine (NORVASC) 10 MG tablet Take 1 tablet (10 mg total) by mouth daily.  . [DISCONTINUED] metoprolol succinate (TOPROL-XL) 25 MG 24 hr tablet Take 1 tablet (25 mg total) by mouth daily. ** DO NOT CRUSH **    (BETA BLOCKER)  . [DISCONTINUED] omega-3 acid ethyl esters (LOVAZA) 1 g capsule TAKE ONE CAPSULE BY MOUTH DAILY     Allergies:   Codeine   Social History   Socioeconomic History  . Marital status: Widowed    Spouse name: Not on file  . Number of children: 2  . Years of education: 40  . Highest education level: Not on file  Occupational History  . Occupation: Retired from [employer]  . Smoking status: Never Smoker  . Smokeless tobacco: Never Used  Substance and Sexual Activity  . Alcohol use: No  . Drug use: No  . Sexual activity: Not on file  Other Topics Concern  . Not on file  Social History Narrative   Widowed x 3 years.  Lives alone.  Independent of ADLs and ambulation.  Emergency Contact: Basir Leahey ([place]): [phone]. Has two daughters. Still living at home, doesn't use cane/walker.             Social Determinants of Health   Financial Resource Strain:   . Difficulty of Paying Living Expenses: Not on file  Food Insecurity:   . Worried About Charity fundraiser in the Last Year: Not on file  . Ran Out of Food in the Last Year: Not on file  Transportation Needs:   . Lack of Transportation (Medical): Not on file  . Lack of Transportation (Non-Medical): Not on file  Physical Activity:   . Days of Exercise per Week: Not on file  . Minutes of Exercise per Session: Not on file    Stress:   . Feeling of Stress : Not on file  Social Connections:   . Frequency of Communication with Friends and Family: Not on file  . Frequency of Social Gatherings with Friends and Family: Not on file  . Attends Religious Services: Not on file  . Active Member of Clubs or Organizations: Not on file  . Attends Archivist Meetings: Not on file  . Marital Status: Not on file     Family History: The patient's family history includes Atrial fibrillation in his mother; Cancer in his father; Heart attack in his  father.  ROS:   Please see the history of present illness.     All other systems reviewed and are negative.  EKGs/Labs/Other Studies Reviewed:    The following studies were reviewed today:  N/A  EKG:  EKG is ordered today.  The ekg ordered today demonstrates normal sinus rhythm with right bundle branch block  Recent Labs: No results found for requested labs within last 8760 hours.  Recent Lipid Panel No results found for: CHOL, TRIG, HDL, CHOLHDL, VLDL, LDLCALC, LDLDIRECT  Physical Exam:    VS:  BP (!) 118/50   Pulse (!) 57   Temp (!) 97.2 F (36.2 C) (Temporal)   Ht 5\' 10"  (1.778 m)   Wt 159 lb (72.1 kg)   SpO2 97%   BMI 22.81 kg/m     Wt Readings from Last 3 Encounters:  08/09/19 159 lb (72.1 kg)  08/09/18 157 lb (71.2 kg)  04/21/17 166 lb 6.4 oz (75.5 kg)     GEN: Well nourished, well developed in no acute distress HEENT: Normal NECK: No JVD; No carotid bruits LYMPHATICS: No lymphadenopathy CARDIAC: RRR, no murmurs, rubs, gallops RESPIRATORY:  Clear to auscultation without rales, wheezing or rhonchi  ABDOMEN: Soft, non-tender, non-distended MUSCULOSKELETAL:  No edema; No deformity  SKIN: Warm and dry NEUROLOGIC:  Alert and oriented x 3 PSYCHIATRIC:  Normal affect   ASSESSMENT:    1. PAF (paroxysmal atrial fibrillation) ([place])   2. Essential hypertension   3. Diffuse large B-cell lymphoma, unspecified body region ([place])   4. AAA  (abdominal aortic aneurysm) without rupture (HCC)    PLAN:    In order of problems listed above:  1. PAF: Continue amiodarone therapy.  Not on any anticoagulation therapy. CHA2DS2-Vasc score 3 (age, HTN).  Continue aspirin  2. Hypertension: Blood pressure well controlled on current therapy.  Will refill amlodipine and metoprolol  3. Stage IV lymphoma: He has given up treatment for many years now.  He is near the end of life and has plan to donate his body to [facility] after death  4. AAA: Continue blood pressure control.  No plan for repeat ultrasound given stage IV lymphoma  He is DNR and plans to donate his body to [employer] for medical science after his staff.   Medication Adjustments/Labs and Tests Ordered: Current medicines are reviewed at length with the patient today.  Concerns regarding medicines are outlined above.  Orders Placed This Encounter  Procedures  . EKG 12-Lead   Meds ordered this encounter  Medications  . amiodarone (PACERONE) 200 MG tablet    Sig: TAKE ONE AND ONE-HALF (1 & 1/2) TABLET BY MOUTH DAILY    Dispense:  135 tablet    Refill:  3  . amLODipine (NORVASC) 10 MG tablet    Sig: Take 1 tablet (10 mg total) by mouth daily.    Dispense:  90 tablet    Refill:  3  . metoprolol succinate (TOPROL-XL) 25 MG 24 hr tablet    Sig: Take 1 tablet (25 mg total) by mouth daily. ** DO NOT CRUSH **    (BETA BLOCKER)    Dispense:  90 tablet    Refill:  3  . omega-3 acid ethyl esters (LOVAZA) 1 g capsule    Sig: Take 1 capsule (1 g total) by mouth daily.    Dispense:  90 capsule    Refill:  3    Patient Instructions  Medication Instructions:  Your physician recommends that you continue  on your current medications as directed. Please refer to the Current Medication list given to you today.  *If you need a refill on your cardiac medications before your next appointment, please call your pharmacy*  Lab Work: NONE ordered at this time of  appointment   If you have labs (blood work) drawn today and your tests are completely normal, you will receive your results only by: [name] MyChart Message (if you have MyChart) OR . A paper copy in the mail If you have any lab test that is abnormal or we need to change your treatment, we will call you to review the results.  Testing/Procedures: NONE ordered at this time of appointment   Follow-Up: At [facility], you and your health needs are our priority.  As part of our continuing mission to provide you with exceptional heart care, we have created designated Provider Care Teams.  These Care Teams include your primary Cardiologist (physician) and Advanced Practice Providers (APPs -  Physician Assistants and Nurse Practitioners) who all work together to provide you with the care you need, when you need it.  Your next appointment:   12 month(s)  The format for your next appointment:   In Person  Provider:   Shelva Majestic, MD  Other Instructions      Signed, Almyra Deforest, [place]  08/11/2019 10:31 PM    [place]

## 2019-08-09 NOTE — Patient Instructions (Signed)
Medication Instructions:  Your physician recommends that you continue on your current medications as directed. Please refer to the Current Medication list given to you today.  *If you need a refill on your cardiac medications before your next appointment, please call your pharmacy*  Lab Work: NONE ordered at this time of appointment   If you have labs (blood work) drawn today and your tests are completely normal, you will receive your results only by: MyChart Message (if you have MyChart) OR A paper copy in the mail If you have any lab test that is abnormal or we need to change your treatment, we will call you to review the results.  Testing/Procedures: NONE ordered at this time of appointment   Follow-Up: At [facility], you and your health needs are our priority.  As part of our continuing mission to provide you with exceptional heart care, we have created designated Provider Care Teams.  These Care Teams include your primary Cardiologist (physician) and Advanced Practice Providers (APPs -  Physician Assistants and Nurse Practitioners) who all work together to provide you with the care you need, when you need it.  Your next appointment:   12 month(s)  The format for your next appointment:   In Person  Provider:   Thomas Kelly, MD   Other Instructions   

## 2019-08-11 ENCOUNTER — Encounter: Payer: Self-pay | Admitting: Physician Assistant

## 2019-10-18 DIAGNOSIS — H524 Presbyopia: Secondary | ICD-10-CM | POA: Diagnosis not present

## 2019-10-23 DIAGNOSIS — N1831 Chronic kidney disease, stage 3a: Secondary | ICD-10-CM | POA: Diagnosis not present

## 2019-10-23 DIAGNOSIS — J449 Chronic obstructive pulmonary disease, unspecified: Secondary | ICD-10-CM | POA: Diagnosis not present

## 2019-10-23 DIAGNOSIS — Z8572 Personal history of non-Hodgkin lymphomas: Secondary | ICD-10-CM | POA: Diagnosis not present

## 2019-10-23 DIAGNOSIS — I10 Essential (primary) hypertension: Secondary | ICD-10-CM | POA: Diagnosis not present

## 2020-04-24 DIAGNOSIS — R197 Diarrhea, unspecified: Secondary | ICD-10-CM | POA: Diagnosis not present

## 2020-04-24 DIAGNOSIS — R1110 Vomiting, unspecified: Secondary | ICD-10-CM | POA: Diagnosis not present

## 2020-04-28 DIAGNOSIS — Z23 Encounter for immunization: Secondary | ICD-10-CM | POA: Diagnosis not present

## 2020-04-28 DIAGNOSIS — R197 Diarrhea, unspecified: Secondary | ICD-10-CM | POA: Diagnosis not present

## 2020-04-28 DIAGNOSIS — R112 Nausea with vomiting, unspecified: Secondary | ICD-10-CM | POA: Diagnosis not present

## 2020-04-29 DIAGNOSIS — R197 Diarrhea, unspecified: Secondary | ICD-10-CM | POA: Diagnosis not present

## 2020-05-04 ENCOUNTER — Encounter (HOSPITAL_COMMUNITY): Payer: Self-pay

## 2020-05-04 ENCOUNTER — Emergency Department (HOSPITAL_COMMUNITY): Payer: Medicare Other

## 2020-05-04 ENCOUNTER — Other Ambulatory Visit: Payer: Self-pay

## 2020-05-04 ENCOUNTER — Inpatient Hospital Stay (HOSPITAL_COMMUNITY)
Admission: EM | Admit: 2020-05-04 | Discharge: 2020-05-08 | DRG: 372 | Disposition: A | Discharge status: Home / Self Care | Payer: Medicare Other | Attending: Internal Medicine | Admitting: Internal Medicine

## 2020-05-04 DIAGNOSIS — I482 Chronic atrial fibrillation, unspecified: Secondary | ICD-10-CM | POA: Diagnosis not present

## 2020-05-04 DIAGNOSIS — K5730 Diverticulosis of large intestine without perforation or abscess without bleeding: Secondary | ICD-10-CM | POA: Diagnosis not present

## 2020-05-04 DIAGNOSIS — Z79899 Other long term (current) drug therapy: Secondary | ICD-10-CM

## 2020-05-04 DIAGNOSIS — I10 Essential (primary) hypertension: Secondary | ICD-10-CM | POA: Diagnosis not present

## 2020-05-04 DIAGNOSIS — Z9049 Acquired absence of other specified parts of digestive tract: Secondary | ICD-10-CM | POA: Diagnosis not present

## 2020-05-04 DIAGNOSIS — I480 Paroxysmal atrial fibrillation: Secondary | ICD-10-CM | POA: Diagnosis present

## 2020-05-04 DIAGNOSIS — R748 Abnormal levels of other serum enzymes: Secondary | ICD-10-CM | POA: Diagnosis not present

## 2020-05-04 DIAGNOSIS — K838 Other specified diseases of biliary tract: Secondary | ICD-10-CM | POA: Diagnosis present

## 2020-05-04 DIAGNOSIS — I129 Hypertensive chronic kidney disease with stage 1 through stage 4 chronic kidney disease, or unspecified chronic kidney disease: Secondary | ICD-10-CM | POA: Diagnosis present

## 2020-05-04 DIAGNOSIS — N1831 Chronic kidney disease, stage 3a: Secondary | ICD-10-CM | POA: Diagnosis present

## 2020-05-04 DIAGNOSIS — E860 Dehydration: Secondary | ICD-10-CM | POA: Diagnosis not present

## 2020-05-04 DIAGNOSIS — Z7982 Long term (current) use of aspirin: Secondary | ICD-10-CM

## 2020-05-04 DIAGNOSIS — R197 Diarrhea, unspecified: Secondary | ICD-10-CM

## 2020-05-04 DIAGNOSIS — R112 Nausea with vomiting, unspecified: Secondary | ICD-10-CM | POA: Diagnosis present

## 2020-05-04 DIAGNOSIS — E876 Hypokalemia: Secondary | ICD-10-CM | POA: Diagnosis not present

## 2020-05-04 DIAGNOSIS — I341 Nonrheumatic mitral (valve) prolapse: Secondary | ICD-10-CM | POA: Diagnosis present

## 2020-05-04 DIAGNOSIS — I714 Abdominal aortic aneurysm, without rupture: Secondary | ICD-10-CM | POA: Diagnosis present

## 2020-05-04 DIAGNOSIS — Z20822 Contact with and (suspected) exposure to covid-19: Secondary | ICD-10-CM | POA: Diagnosis present

## 2020-05-04 DIAGNOSIS — I4510 Unspecified right bundle-branch block: Secondary | ICD-10-CM | POA: Diagnosis present

## 2020-05-04 DIAGNOSIS — Z66 Do not resuscitate: Secondary | ICD-10-CM | POA: Diagnosis not present

## 2020-05-04 DIAGNOSIS — Z885 Allergy status to narcotic agent status: Secondary | ICD-10-CM | POA: Diagnosis not present

## 2020-05-04 DIAGNOSIS — A045 Campylobacter enteritis: Secondary | ICD-10-CM | POA: Diagnosis not present

## 2020-05-04 DIAGNOSIS — J449 Chronic obstructive pulmonary disease, unspecified: Secondary | ICD-10-CM | POA: Diagnosis present

## 2020-05-04 DIAGNOSIS — J439 Emphysema, unspecified: Secondary | ICD-10-CM | POA: Diagnosis not present

## 2020-05-04 DIAGNOSIS — K828 Other specified diseases of gallbladder: Secondary | ICD-10-CM

## 2020-05-04 DIAGNOSIS — R001 Bradycardia, unspecified: Secondary | ICD-10-CM | POA: Diagnosis present

## 2020-05-04 DIAGNOSIS — R1114 Bilious vomiting: Secondary | ICD-10-CM | POA: Diagnosis not present

## 2020-05-04 DIAGNOSIS — G2581 Restless legs syndrome: Secondary | ICD-10-CM | POA: Diagnosis not present

## 2020-05-04 DIAGNOSIS — Z8572 Personal history of non-Hodgkin lymphomas: Secondary | ICD-10-CM | POA: Diagnosis not present

## 2020-05-04 DIAGNOSIS — Z8249 Family history of ischemic heart disease and other diseases of the circulatory system: Secondary | ICD-10-CM | POA: Diagnosis not present

## 2020-05-04 LAB — COMPREHENSIVE METABOLIC PANEL
ALT: 49 U/L — ABNORMAL HIGH (ref 0–44)
AST: 52 U/L — ABNORMAL HIGH (ref 15–41)
Albumin: 3.8 g/dL (ref 3.5–5.0)
Alkaline Phosphatase: 115 U/L (ref 38–126)
Anion gap: 11 (ref 5–15)
BUN: 21 mg/dL (ref 8–23)
CO2: 24 mmol/L (ref 22–32)
Calcium: 8.7 mg/dL — ABNORMAL LOW (ref 8.9–10.3)
Chloride: 105 mmol/L (ref 98–111)
Creatinine, Ser: 1.38 mg/dL — ABNORMAL HIGH (ref 0.61–1.24)
GFR, Estimated: 45 mL/min — ABNORMAL LOW (ref >60)
Glucose, Bld: 97 mg/dL (ref 70–99)
Potassium: 2.7 mmol/L — CL (ref 3.5–5.1)
Sodium: 140 mmol/L (ref 135–145)
Total Bilirubin: 1.8 mg/dL — ABNORMAL HIGH (ref 0.3–1.2)
Total Protein: 6.5 g/dL (ref 6.5–8.1)

## 2020-05-04 LAB — RESPIRATORY PANEL BY RT PCR (FLU A&B, COVID)
Influenza A by PCR: NEGATIVE
Influenza B by PCR: NEGATIVE
SARS Coronavirus 2 by RT PCR: NEGATIVE

## 2020-05-04 LAB — CBC WITH DIFFERENTIAL/PLATELET
Abs Immature Granulocytes: 0.01 10*3/uL (ref 0.00–0.07)
Basophils Absolute: 0 10*3/uL (ref 0.0–0.1)
Basophils Relative: 1 %
Eosinophils Absolute: 0.3 10*3/uL (ref 0.0–0.5)
Eosinophils Relative: 6 %
HCT: 38.9 % — ABNORMAL LOW (ref 39.0–52.0)
Hemoglobin: 13.6 g/dL (ref 13.0–17.0)
Immature Granulocytes: 0 %
Lymphocytes Relative: 23 %
Lymphs Abs: 1.1 10*3/uL (ref 0.7–4.0)
MCH: 33.8 pg (ref 26.0–34.0)
MCHC: 35 g/dL (ref 30.0–36.0)
MCV: 96.8 fL (ref 80.0–100.0)
Monocytes Absolute: 0.5 10*3/uL (ref 0.1–1.0)
Monocytes Relative: 11 %
Neutro Abs: 2.8 10*3/uL (ref 1.7–7.7)
Neutrophils Relative %: 59 %
Platelets: 180 10*3/uL (ref 150–400)
RBC: 4.02 MIL/uL — ABNORMAL LOW (ref 4.22–5.81)
RDW: 13.8 % (ref 11.5–15.5)
WBC: 4.8 10*3/uL (ref 4.0–10.5)
nRBC: 0 % (ref 0.0–0.2)

## 2020-05-04 LAB — MAGNESIUM: Magnesium: 2.1 mg/dL (ref 1.7–2.4)

## 2020-05-04 MED ORDER — ACETAMINOPHEN 325 MG PO TABS: 650.0000 mg | ORAL_TABLET | Freq: Four times a day (QID) | ORAL | Status: DC | PRN

## 2020-05-04 MED ORDER — POTASSIUM CHLORIDE 10 MEQ/100ML IV SOLN
10.0000 meq | INTRAVENOUS | Status: AC
  Administered 2020-05-04 (×4): 10 meq via INTRAVENOUS
  Filled 2020-05-04 (×4): qty 100

## 2020-05-04 MED ORDER — MELATONIN 3 MG PO TABS
6.0000 mg | ORAL_TABLET | Freq: Every evening | ORAL | Status: DC | PRN
  Administered 2020-05-04 – 2020-05-07 (×4): 6 mg via ORAL
  Filled 2020-05-04 (×4): qty 2

## 2020-05-04 MED ORDER — ONDANSETRON HCL 4 MG PO TABS: 4.0000 mg | ORAL_TABLET | Freq: Four times a day (QID) | ORAL | Status: DC | PRN

## 2020-05-04 MED ORDER — ONDANSETRON HCL 4 MG/2ML IJ SOLN
4.0000 mg | Freq: Four times a day (QID) | INTRAMUSCULAR | Status: DC | PRN
  Administered 2020-05-05 (×2): 4 mg via INTRAVENOUS
  Filled 2020-05-04 (×2): qty 2

## 2020-05-04 MED ORDER — ACETAMINOPHEN 650 MG RE SUPP: 650.0000 mg | Freq: Four times a day (QID) | RECTAL | Status: DC | PRN

## 2020-05-04 MED ORDER — OMEGA-3-ACID ETHYL ESTERS 1 G PO CAPS
1.0000 | ORAL_CAPSULE | Freq: Every day | ORAL | Status: DC
  Administered 2020-05-05 – 2020-05-08 (×4): 1 g via ORAL
  Filled 2020-05-04 (×4): qty 1

## 2020-05-04 MED ORDER — ONDANSETRON HCL 4 MG/2ML IJ SOLN
4.0000 mg | Freq: Once | INTRAMUSCULAR | Status: AC
  Administered 2020-05-04: 4 mg via INTRAVENOUS
  Filled 2020-05-04: qty 2

## 2020-05-04 MED ORDER — SODIUM CHLORIDE 0.9 % IV SOLN: INTRAVENOUS | Status: DC

## 2020-05-04 MED ORDER — ASPIRIN EC 81 MG PO TBEC
81.0000 mg | DELAYED_RELEASE_TABLET | Freq: Every day | ORAL | Status: DC
  Administered 2020-05-05 – 2020-05-08 (×4): 81 mg via ORAL
  Filled 2020-05-04 (×4): qty 1

## 2020-05-04 MED ORDER — ROPINIROLE HCL 1 MG PO TABS
1.0000 mg | ORAL_TABLET | Freq: Every day | ORAL | Status: DC | PRN
  Filled 2020-05-04: qty 1

## 2020-05-04 MED ORDER — ENOXAPARIN SODIUM 30 MG/0.3ML ~~LOC~~ SOLN
30.0000 mg | SUBCUTANEOUS | Status: DC
  Administered 2020-05-04: 30 mg via SUBCUTANEOUS
  Filled 2020-05-04: qty 0.3

## 2020-05-04 MED ORDER — METOPROLOL SUCCINATE ER 25 MG PO TB24
12.5000 mg | ORAL_TABLET | Freq: Every day | ORAL | Status: DC
  Administered 2020-05-06: 12.5 mg via ORAL
  Filled 2020-05-04 (×2): qty 1

## 2020-05-04 MED ORDER — AMIODARONE HCL 200 MG PO TABS
300.0000 mg | ORAL_TABLET | Freq: Every day | ORAL | Status: DC
  Administered 2020-05-06 – 2020-05-08 (×2): 300 mg via ORAL
  Filled 2020-05-04 (×4): qty 2

## 2020-05-04 MED ORDER — POTASSIUM CHLORIDE IN NACL 20-0.9 MEQ/L-% IV SOLN
Freq: Once | INTRAVENOUS | Status: AC
  Filled 2020-05-04: qty 1000

## 2020-05-04 MED ORDER — IOHEXOL 300 MG/ML  SOLN
75.0000 mL | Freq: Once | INTRAMUSCULAR | Status: AC | PRN
  Administered 2020-05-04: 75 mL via INTRAVENOUS

## 2020-05-04 MED ORDER — AMLODIPINE BESYLATE 10 MG PO TABS
10.0000 mg | ORAL_TABLET | Freq: Every day | ORAL | Status: DC
  Administered 2020-05-04 – 2020-05-06 (×2): 10 mg via ORAL
  Filled 2020-05-04: qty 2
  Filled 2020-05-04 (×2): qty 1

## 2020-05-04 NOTE — ED Notes (Signed)
CBG was 92

## 2020-05-04 NOTE — ED Notes (Signed)
X-ray at bedside

## 2020-05-04 NOTE — H&P (Addendum)
History and Physical    Thomas Doyle:[id number] DOB: [birth year] DOA: 05/04/2020  PCP: Leeroy Cha, MD  Patient coming from: Home  Chief Complaint: N/V/D  HPI: Thomas Doyle is a 86 y.o. male with medical history significant of HTN, Large B-cell lymphoma. Presents with intractable N/V/D. He has had persistent diarrhea for over a month. OTC medications did not help. Changing diet did not help. 2 weeks ago after speaking with his PCP, it was recommended that he go to Urgent Care. He was seen there and received unspecified meds that didn't help. He was able to get in with his PCP last week and was given probiotics. His symptoms seem to have gotten worse. He was weak and lethargic yesterday. His daughter reports that he wasn't coherent. They saw his PCP again today, and he was told to come to ED. At this point, he can not keep food or liquids down. Diarrhea remains persistent w/ last episode an hour before interview.   ED Course: CT imaging revealed dilated common bile duct. Lab work showed mildly elevated liver enzymes. K+ was down to 2.7. TRH was called for admission.   Review of Systems:  He denies F, CP, dyspnea, palpitations. Review of systems is otherwise negative for all not mentioned in HPI.   PMHx Past Medical History:  Diagnosis Date  . AAA (abdominal aortic aneurysm) (HCC)    intrarenal  . Anemia   . COPD (chronic obstructive pulmonary disease) [facility]) dx April 2013  . Hypertension   . Large B-cell lymphoma ([place]) 11/2011  . Mitral valve prolapse   . Non Hodgkin's lymphoma ([place])   . PAF (paroxysmal atrial fibrillation) (HCC)    On amiodarone, digoxin. Not on coumadin  . Pneumonia April 2013  . Right bundle branch block (RBBB)   . Sick sinus syndrome [facility])     PSHx Past Surgical History:  Procedure Laterality Date  . abdominal aorta aneurysm duplex  11/20/2012   normal - demonstrates normal taper  . CARDIAC CATHETERIZATION  09/02/2010   normal LV function,  normal coronary arteries, irregularity of the distal aorta with a small aneursymal dilatation  . CARDIOVASCULAR STRESS TEST  08/20/2010   evidence of mild-moderate ischemia in Basal inferoseptal, Basal inferior, Mid inferoseptal and Mid inferior regions. EKG negative for ischemia. Patient developed RBBB during exercise and transient ventricular bigeminal rhythm  . CHEST TUBE INSERTION  1990's   fell  thru deck, punctured lung, chest tube  . CHOLECYSTECTOMY  1980's  . CIRCUMCISION  age 62  . DOPPLER ECHOCARDIOGRAPHY  04/05/2012   EF 63%, borderline MVP with mild mitral regurg  . TONSILLECTOMY      SocHx  reports that he has never smoked. He has never used smokeless tobacco. He reports that he does not drink alcohol and does not use drugs.  Allergies  Allergen Reactions  . Codeine Nausea Only    FamHx Family History  Problem Relation Age of Onset  . Atrial fibrillation Mother   . Cancer Father   . Heart attack Father     Prior to Admission medications   Medication Sig Start Date End Date Taking? Authorizing Provider  amiodarone (PACERONE) 200 MG tablet TAKE ONE AND ONE-HALF (1 & 1/2) TABLET BY MOUTH DAILY 08/09/19   Almyra Deforest, PA  amLODipine (NORVASC) 10 MG tablet Take 1 tablet (10 mg total) by mouth daily. 08/09/19 11/07/19  Almyra Deforest, PA  aspirin 81 MG tablet Take 81 mg by mouth daily.    [provider]  cholecalciferol (VITAMIN D3) 25 MCG (1000 UT) tablet Take 1,000 Units by mouth daily.    [provider]  metoprolol succinate (TOPROL-XL) 25 MG 24 hr tablet Take 1 tablet (25 mg total) by mouth daily. ** DO NOT CRUSH **    (BETA BLOCKER) 08/09/19   Almyra Deforest, PA  omega-3 acid ethyl esters (LOVAZA) 1 g capsule Take 1 capsule (1 g total) by mouth daily. 08/09/19   Almyra Deforest, PA  tamsulosin (FLOMAX) 0.4 MG CAPS capsule Take 0.4 mg by mouth See admin instructions. 5 times weekly 11/11/17   [provider]  vitamin B-12 (CYANOCOBALAMIN) 1000 MCG tablet Take 1,000  mcg by mouth daily.    [provider]  zolpidem (AMBIEN) 5 MG tablet Take 1 tablet (5 mg total) by mouth at bedtime as needed. for sleep Patient taking differently: Take 1 mg by mouth at bedtime. for sleep 10/20/17   Troy Sine, MD    Physical Exam: Vitals:   05/04/20 1144 05/04/20 1145 05/04/20 1354 05/04/20 1500  BP: (!) 156/65 (!) 147/57 130/67 (!) 152/63  Pulse: 62 61 61 65  Resp: (!) 23 (!) $Remo'25 18 18  'jISbv$ Temp: 97.9 F (36.6 C)     TempSrc: Oral     SpO2: 99% 98% 100% 99%    General: 86 y.o. male resting in bed in NAD Eyes: PERRL, normal sclera ENMT: Nares patent w/o discharge, orophaynx clear, dentition normal, ears w/o discharge/lesions/ulcers Neck: Supple, trachea midline Cardiovascular: RRR, +S1, S2, no m/g/r, equal pulses throughout Respiratory: CTABL, no w/r/r, normal WOB GI: BS+, ND, slight TTP RLQ/LLQ, no masses noted, no organomegaly noted MSK: No e/c/c Skin: No rashes, bruises, ulcerations noted Neuro: A&O x 3, no focal deficits Psyc: Appropriate interaction and affect, calm/cooperative  Labs on Admission: I have personally reviewed following labs and imaging studies  CBC: Recent Labs  Lab 05/04/20 1214  WBC 4.8  NEUTROABS 2.8  HGB 13.6  HCT 38.9*  MCV 96.8  PLT 233   Basic Metabolic Panel: Recent Labs  Lab 05/04/20 1214  NA 140  K 2.7*  CL 105  CO2 24  GLUCOSE 97  BUN 21  CREATININE 1.38*  CALCIUM 8.7*   GFR: CrCl cannot be calculated (Unknown ideal weight.). Liver Function Tests: Recent Labs  Lab 05/04/20 1214  AST 52*  ALT 49*  ALKPHOS 115  BILITOT 1.8*  PROT 6.5  ALBUMIN 3.8   No results for input(s): LIPASE, AMYLASE in the last 168 hours. No results for input(s): AMMONIA in the last 168 hours. Coagulation Profile: No results for input(s): INR, PROTIME in the last 168 hours. Cardiac Enzymes: No results for input(s): CKTOTAL, CKMB, CKMBINDEX, TROPONINI in the last 168 hours. BNP (last 3 results) No results for  input(s): PROBNP in the last 8760 hours. HbA1C: No results for input(s): HGBA1C in the last 72 hours. CBG: No results for input(s): GLUCAP in the last 168 hours. Lipid Profile: No results for input(s): CHOL, HDL, LDLCALC, TRIG, CHOLHDL, LDLDIRECT in the last 72 hours. Thyroid Function Tests: No results for input(s): TSH, T4TOTAL, FREET4, T3FREE, THYROIDAB in the last 72 hours. Anemia Panel: No results for input(s): VITAMINB12, FOLATE, FERRITIN, TIBC, IRON, RETICCTPCT in the last 72 hours. Urine analysis:    Component Value Date/Time   COLORURINE YELLOW 12/27/2016 1820   APPEARANCEUR CLEAR 12/27/2016 1820   LABSPEC 1.018 12/27/2016 1820   PHURINE 5.0 12/27/2016 1820   GLUCOSEU NEGATIVE 12/27/2016 1820   HGBUR NEGATIVE 12/27/2016 1820  BILIRUBINUR NEGATIVE 12/27/2016 1820   KETONESUR 20 (A) 12/27/2016 1820   PROTEINUR NEGATIVE 12/27/2016 1820   UROBILINOGEN 0.2 12/08/2011 2020   NITRITE NEGATIVE 12/27/2016 1820   LEUKOCYTESUR NEGATIVE 12/27/2016 1820    Radiological Exams on Admission: CT Abdomen Pelvis W Contrast  Result Date: 05/04/2020 CLINICAL DATA:  Diverticulitis.  Diarrhea. EXAM: CT ABDOMEN AND PELVIS WITH CONTRAST TECHNIQUE: Multidetector CT imaging of the abdomen and pelvis was performed using the standard protocol following bolus administration of intravenous contrast. CONTRAST:  79mL OMNIPAQUE IOHEXOL 300 MG/ML  SOLN COMPARISON:  01/10/2008 FINDINGS: Lower chest: Pulmonary scarring and peripheral emphysematous change right more than left. No active pulmonary or lower chest process. Hepatobiliary: Previous cholecystectomy. Intra and extrahepatic biliary ductal dilatation, with the common duct measuring up to 2 cm in diameter. Duct is dilated all the way to the M Pugh low. No visible obstructing stone or mass. No evidence of hepatic parenchymal lesion. Pancreas: Negative Spleen: Normal Adrenals/Urinary Tract: Adrenal glands are normal. Kidneys are normal. No cyst, mass, stone  or hydronephrosis. Bladder is normal. Stomach/Bowel: Stomach and small intestine are normal. Patient does have some liquid stool in the colon consistent with the history of diarrhea. Very minimal diverticulosis. No sign of diverticulitis. Vascular/Lymphatic: Aortic atherosclerosis. Maximal diameter the infrarenal abdominal aorta 2.7 cm. Reproductive: Normal Other: No free fluid or air. Musculoskeletal: Ordinary lumbar degenerative changes. IMPRESSION: 1. Liquid stool in the colon consistent with the history of diarrhea. No sign of diverticulitis. 2. Previous cholecystectomy. Intra and extrahepatic biliary ductal dilatation, with the common duct measuring up to 2 cm in diameter. No visible obstructing stone or mass. Are there clinical or laboratory concerns regarding biliary obstruction? Aortic Atherosclerosis (ICD10-I70.0). Electronically Signed   By: Nelson Chimes M.D.   On: 05/04/2020 14:47   DG Chest Port 1 View  Result Date: 05/04/2020 CLINICAL DATA:  COPD.  Hypertension.  Vomiting and diarrhea. EXAM: PORTABLE CHEST 1 VIEW COMPARISON:  12/11/2017 FINDINGS: Artifact overlies the chest. The lungs are hyperinflated consistent with emphysema. Some scattered areas of pulmonary scarring. Evidence of infiltrate, collapse edema or effusion. Ordinary aortic atherosclerosis. IMPRESSION: No active disease. Emphysema. Aortic atherosclerosis. Electronically Signed   By: Nelson Chimes M.D.   On: 05/04/2020 12:48   Assessment/Plan Intractable N/V Intractable D     - admit to inpatient, telemetry     - check c diff, GI PCR (discontinue order if no stools in 24hrs); can add imodium/psyllium if testing negative     - zofran, fluids     - CT ab/pelvis notes liquid stool in colon; also notes CBD dilatation up to 2cm (see below)  Hypokalemia     - replace K+, Mg2+ is ok  A fib     - continue home amiodarone, metoprolol w/ hold parameters  Dilated Common Bile Duct     - CT shows CBD dilated to 2 cm     - alk phos  is ok and LFT are mildly elevated     - t. Bili is 1.8     - mild TTP of LLQ/RLQ     - spoke with [facility]; they will eval, appreciate their assistance  Restless Leg Syndrome     - continue home requip  CKD3a     - at baseline, watch nephrotoxins  DVT prophylaxis: lovenox  Code Status: DNR, confirmed at bedside with dtr present  Family Communication: With daughter at bedside.  Consults called: [facility]  Admission status: Inpatient  Status is: Inpatient  Remains inpatient appropriate because:IV treatments appropriate due to intensity of illness or inability to take PO   Dispo: The patient is from: Home              Anticipated d/c is to: Home              Anticipated d/c date is: 2 days              Patient currently is not medically stable to d/c.  Jonnie Finner DO [facility]  If 7PM-7AM, please contact night-coverage [email or web address]  05/04/2020, 3:22 PM

## 2020-05-04 NOTE — ED Provider Notes (Signed)
[place] DEPT Provider Note   CSN: [id number] Arrival date & time: 05/04/20  1127     History Chief Complaint  Patient presents with  . Emesis  . Diarrhea    Thomas Doyle is a 86 y.o. male.  HPI     Presents with his daughter who provides much of the HPI.  The patient has multiple medical issues including prior lymphoma, COPD.  He presents with 1 month of worsening overall condition, persistent nausea, anorexia, and near constant diarrhea. He notes that any attempt at eating, drinking resulted in postprandial loose bowel movements and typically vomiting. He has recently become intolerant of his medications as well. There are some abdominal discomfort, though not persistent, seemingly not present when he is not eating. No fever, no syncope.  Patient has had progressive energy loss, has been unable to perform ADLs secondary to his weakness. He denies chest pain, dyspnea, denies confusion. Patient has DO NOT RESUSCITATE order.  Patient has been seen and evaluated at primary care, and urgent care, and today went to his physician's office.  He was soon sent here for evaluation, after that brief interview. Does seem as though outpatient GI studies have been performed.  Past Medical History:  Diagnosis Date  . AAA (abdominal aortic aneurysm) (HCC)    intrarenal  . Anemia   . COPD (chronic obstructive pulmonary disease) [facility]) dx April 2013  . Hypertension   . Large B-cell lymphoma ([place]) 11/2011  . Mitral valve prolapse   . Non Hodgkin's lymphoma ([place])   . PAF (paroxysmal atrial fibrillation) (HCC)    On amiodarone, digoxin. Not on coumadin  . Pneumonia April 2013  . Right bundle branch block (RBBB)   . Sick sinus syndrome [facility])     Patient Active Problem List   Diagnosis Date Noted  . Community acquired pneumonia 12/27/2016  . Hyponatremia 12/27/2016  . PVC's (premature ventricular contractions) 09/14/2014  . Paroxysmal atrial  fibrillation ([place]) 06/23/2013  . Right bundle branch block 06/23/2013  . Mitral valve prolapse 06/23/2013  . Neutropenia ([place]) 03/16/2012  . Constipation 12/09/2011  . Hypercalcemia of malignancy 12/08/2011  . Normocytic anemia 11/23/2011  . Thrombocytopenia ([place]) 11/23/2011  . Acute renal failure ([place]) 11/23/2011  . Dehydration 11/23/2011  . Hyperbilirubinemia 11/23/2011  . Essential hypertension 11/23/2011  . COPD (chronic obstructive pulmonary disease) ([place]) 11/23/2011  . UTI (lower urinary tract infection) 11/23/2011  . Large B-cell lymphoma ([place]) 11/23/2011    Past Surgical History:  Procedure Laterality Date  . abdominal aorta aneurysm duplex  11/20/2012   normal - demonstrates normal taper  . CARDIAC CATHETERIZATION  09/02/2010   normal LV function, normal coronary arteries, irregularity of the distal aorta with a small aneursymal dilatation  . CARDIOVASCULAR STRESS TEST  08/20/2010   evidence of mild-moderate ischemia in Basal inferoseptal, Basal inferior, Mid inferoseptal and Mid inferior regions. EKG negative for ischemia. Patient developed RBBB during exercise and transient ventricular bigeminal rhythm  . CHEST TUBE INSERTION  1990's   fell  thru deck, punctured lung, chest tube  . CHOLECYSTECTOMY  1980's  . CIRCUMCISION  age 30  . DOPPLER ECHOCARDIOGRAPHY  04/05/2012   EF 63%, borderline MVP with mild mitral regurg  . TONSILLECTOMY         Family History  Problem Relation Age of Onset  . Atrial fibrillation Mother   . Cancer Father   . Heart attack Father     Social History   Tobacco Use  . Smoking  status: Never Smoker  . Smokeless tobacco: Never Used  Substance Use Topics  . Alcohol use: No  . Drug use: No    Home Medications Prior to Admission medications   Medication Sig Start Date End Date Taking? Authorizing Provider  amiodarone (PACERONE) 200 MG tablet TAKE ONE AND ONE-HALF (1 & 1/2) TABLET BY MOUTH DAILY 08/09/19   Almyra Deforest, PA  amLODipine  (NORVASC) 10 MG tablet Take 1 tablet (10 mg total) by mouth daily. 08/09/19 11/07/19  Almyra Deforest, PA  aspirin 81 MG tablet Take 81 mg by mouth daily.    [provider]  cholecalciferol (VITAMIN D3) 25 MCG (1000 UT) tablet Take 1,000 Units by mouth daily.    [provider]  metoprolol succinate (TOPROL-XL) 25 MG 24 hr tablet Take 1 tablet (25 mg total) by mouth daily. ** DO NOT CRUSH **    (BETA BLOCKER) 08/09/19   Almyra Deforest, PA  omega-3 acid ethyl esters (LOVAZA) 1 g capsule Take 1 capsule (1 g total) by mouth daily. 08/09/19   Almyra Deforest, PA  tamsulosin (FLOMAX) 0.4 MG CAPS capsule Take 0.4 mg by mouth See admin instructions. 5 times weekly 11/11/17   [provider]  vitamin B-12 (CYANOCOBALAMIN) 1000 MCG tablet Take 1,000 mcg by mouth daily.    [provider]  zolpidem (AMBIEN) 5 MG tablet Take 1 tablet (5 mg total) by mouth at bedtime as needed. for sleep Patient taking differently: Take 1 mg by mouth at bedtime. for sleep 10/20/17   Troy Sine, MD    Allergies    Codeine  Review of Systems   Review of Systems  Constitutional:       Per HPI, otherwise negative  HENT:       Per HPI, otherwise negative  Respiratory:       Per HPI, otherwise negative  Cardiovascular:       Per HPI, otherwise negative  Gastrointestinal: Positive for diarrhea, nausea and vomiting.  Endocrine:       Negative aside from HPI  Genitourinary:       Neg aside from HPI   Musculoskeletal:       Per HPI, otherwise negative  Skin: Negative.   Neurological: Positive for weakness. Negative for syncope.    Physical Exam Updated Vital Signs BP (!) 152/63   Pulse 65   Temp 97.9 F (36.6 C) (Oral)   Resp 18   SpO2 99%   Physical Exam Vitals and nursing note reviewed.  Constitutional:      General: He is not in acute distress.    Appearance: He is well-developed.  HENT:     Head: Normocephalic and atraumatic.  Eyes:     Conjunctiva/sclera: Conjunctivae normal.    Cardiovascular:     Rate and Rhythm: Normal rate and regular rhythm.  Pulmonary:     Effort: Pulmonary effort is normal. No respiratory distress.     Breath sounds: No stridor.  Abdominal:     General: There is no distension.     Tenderness: There is abdominal tenderness.     Comments: Minimal tenderness throughout, diffusely  Skin:    General: Skin is warm and dry.  Neurological:     Mental Status: He is alert and oriented to person, place, and time.     ED Results / Procedures / Treatments   Labs (all labs ordered are listed, but only abnormal results are displayed) Labs Reviewed  COMPREHENSIVE METABOLIC PANEL - Abnormal; Notable for the following  components:      Result Value   Potassium 2.7 (*)    Creatinine, Ser 1.38 (*)    Calcium 8.7 (*)    AST 52 (*)    ALT 49 (*)    Total Bilirubin 1.8 (*)    GFR, Estimated 45 (*)    All other components within normal limits  CBC WITH DIFFERENTIAL/PLATELET - Abnormal; Notable for the following components:   RBC 4.02 (*)    HCT 38.9 (*)    All other components within normal limits  RESPIRATORY PANEL BY RT PCR (FLU A&B, COVID)  CBG MONITORING, ED    EKG EKG Interpretation  Date/Time:  Monday May 04 2020 12:00:06 EDT Ventricular Rate:  59 PR Interval:    QRS Duration: 166 QT Interval:  500 QTC Calculation: 496 R Axis:   -78 Text Interpretation: Sinus rhythm RBBB and LAFB Abnormal ECG Confirmed by Carmin Muskrat [phone]) on 05/04/2020 12:07:20 PM   Radiology CT Abdomen Pelvis W Contrast  Result Date: 05/04/2020 CLINICAL DATA:  Diverticulitis.  Diarrhea. EXAM: CT ABDOMEN AND PELVIS WITH CONTRAST TECHNIQUE: Multidetector CT imaging of the abdomen and pelvis was performed using the standard protocol following bolus administration of intravenous contrast. CONTRAST:  4mL OMNIPAQUE IOHEXOL 300 MG/ML  SOLN COMPARISON:  01/10/2008 FINDINGS: Lower chest: Pulmonary scarring and peripheral emphysematous change right more than  left. No active pulmonary or lower chest process. Hepatobiliary: Previous cholecystectomy. Intra and extrahepatic biliary ductal dilatation, with the common duct measuring up to 2 cm in diameter. Duct is dilated all the way to the M Pugh low. No visible obstructing stone or mass. No evidence of hepatic parenchymal lesion. Pancreas: Negative Spleen: Normal Adrenals/Urinary Tract: Adrenal glands are normal. Kidneys are normal. No cyst, mass, stone or hydronephrosis. Bladder is normal. Stomach/Bowel: Stomach and small intestine are normal. Patient does have some liquid stool in the colon consistent with the history of diarrhea. Very minimal diverticulosis. No sign of diverticulitis. Vascular/Lymphatic: Aortic atherosclerosis. Maximal diameter the infrarenal abdominal aorta 2.7 cm. Reproductive: Normal Other: No free fluid or air. Musculoskeletal: Ordinary lumbar degenerative changes. IMPRESSION: 1. Liquid stool in the colon consistent with the history of diarrhea. No sign of diverticulitis. 2. Previous cholecystectomy. Intra and extrahepatic biliary ductal dilatation, with the common duct measuring up to 2 cm in diameter. No visible obstructing stone or mass. Are there clinical or laboratory concerns regarding biliary obstruction? Aortic Atherosclerosis (ICD10-I70.0). Electronically Signed   By: Nelson Chimes M.D.   On: 05/04/2020 14:47   DG Chest Port 1 View  Result Date: 05/04/2020 CLINICAL DATA:  COPD.  Hypertension.  Vomiting and diarrhea. EXAM: PORTABLE CHEST 1 VIEW COMPARISON:  12/11/2017 FINDINGS: Artifact overlies the chest. The lungs are hyperinflated consistent with emphysema. Some scattered areas of pulmonary scarring. Evidence of infiltrate, collapse edema or effusion. Ordinary aortic atherosclerosis. IMPRESSION: No active disease. Emphysema. Aortic atherosclerosis. Electronically Signed   By: Nelson Chimes M.D.   On: 05/04/2020 12:48    Procedures Procedures (including critical care  time)  Medications Ordered in ED Medications  0.9 % NaCl with KCl 20 mEq/ L  infusion ( Intravenous New Bag/Given 05/04/20 1350)  iohexol (OMNIPAQUE) 300 MG/ML solution 75 mL (75 mLs Intravenous Contrast Given 05/04/20 1415)  ondansetron (ZOFRAN) injection 4 mg (4 mg Intravenous Given 05/04/20 1504)    ED Course  I have reviewed the triage vital signs and the nursing notes.  Pertinent labs & imaging results that were available during my care of the patient were reviewed  by me and considered in my medical decision making (see chart for details).     After initial evaluation with consideration the patient's history of malignancy, his persistent diarrhea, weakness, broad differential including infection, malignancy, electrolyte normality, labs, urinalysis, x-ray, CT ordered.  Initial resuscitation switched to include potassium, IV given the patient's critically abnormal potassium value. For consideration of his nausea, patient will receive Zofran.  3:45 PM Patient and daughter aware of all findings, including need for admission given his p.o. intolerance, persistent nausea, vomiting, diarrhea.  Elderly male multiple medical issues presents with p.o. intolerance, ongoing diarrhea, worsening weakness.  The patient is found to have dehydration, hypokalemia, requiring admission for monitoring, management, ongoing resuscitation.  Covid negative   Final Clinical Impression(s) / ED Diagnoses Final diagnoses:  Dehydration  Diarrhea, unspecified type  Bilious vomiting with nausea  Hypokalemia     Carmin Muskrat, MD 05/04/20 1547

## 2020-05-04 NOTE — ED Notes (Signed)
ED TO INPATIENT HANDOFF REPORT  Name/Age/Gender Thomas Doyle 86 y.o. male  Code Status    Code Status Orders  (From admission, onward)         Start     Ordered   05/04/20 1612  Do not attempt resuscitation (DNR)  Continuous       Question Answer Comment  In the event of cardiac or respiratory ARREST Do not call a "code blue"   In the event of cardiac or respiratory ARREST Do not perform Intubation, CPR, defibrillation or ACLS   In the event of cardiac or respiratory ARREST Use medication by any route, position, wound care, and other measures to relive pain and suffering. May use oxygen, suction and manual treatment of airway obstruction as needed for comfort.      05/04/20 1611        Code Status History    Date Active Date Inactive Code Status Order ID Comments User Context   12/27/2016 2217 12/29/2016 2040 DNR [id number]  Edwin Dada, MD Inpatient   12/08/2011 2259 12/14/2011 1438 Partial Code [id number]  OK with shock/compression x1 only, then stop, and no intubation Lemar Livings, RN Inpatient   11/23/2011 2026 11/25/2011 1918 Full Code [id number]  Lily Peer A Inpatient   Advance Care Planning Activity    Advance Directive Documentation     Most Recent Value  Type of Advance Directive Healthcare Power of [place], Living will, Out of facility DNR (pink MOST or yellow form)  Pre-existing out of facility DNR order (yellow form or pink MOST form) --  "MOST" Form in Place? --      Home/SNF/Other Home  Chief Complaint Intractable nausea and vomiting [R11.2]  Level of Care/Admitting Diagnosis ED Disposition    ED Disposition Condition [place]: [facility] [100102]  Level of Care: Telemetry [5]  Admit to tele based on following criteria: Monitor for Ischemic changes  May admit patient to [facility] or [facility] if equivalent level of care is available:: No  Covid Evaluation: Confirmed COVID Negative  Diagnosis:  Intractable nausea and vomiting [510258]  Admitting Physician: Jonnie Finner [[id number]]  Attending Physician: Jonnie Finner [[id number]]  Estimated length of stay: past midnight tomorrow  Certification:: I certify this patient will need inpatient services for at least 2 midnights       Medical History Past Medical History:  Diagnosis Date  . AAA (abdominal aortic aneurysm) (HCC)    intrarenal  . Anemia   . COPD (chronic obstructive pulmonary disease) [facility]) dx April 2013  . Hypertension   . Large B-cell lymphoma ([place]) 11/2011  . Mitral valve prolapse   . Non Hodgkin's lymphoma ([place])   . PAF (paroxysmal atrial fibrillation) (HCC)    On amiodarone, digoxin. Not on coumadin  . Pneumonia April 2013  . Right bundle branch block (RBBB)   . Sick sinus syndrome (HCC)     Allergies Allergies  Allergen Reactions  . Codeine Nausea Only    IV Location/Drains/Wounds Patient Lines/Drains/Airways Status    Active Line/Drains/Airways    Name Placement date Placement time Site Days   Peripheral IV 05/04/20 Right Antecubital 05/04/20  1208  Antecubital  less than 1          Labs/Imaging Results for orders placed or performed during the hospital encounter of 05/04/20 (from the past 48 hour(s))  Comprehensive metabolic panel     Status: Abnormal   Collection Time: 05/04/20 12:14 PM  Result Value Ref Range   Sodium 140 135 - 145 mmol/L   Potassium 2.7 (LL) 3.5 - 5.1 mmol/L    Comment: CRITICAL RESULT CALLED TO, READ BACK BY AND VERIFIED WITH: P.DOWD RN AT 1259 ON 05/04/20 BY S.VANHOORNE    Chloride 105 98 - 111 mmol/L   CO2 24 22 - 32 mmol/L   Glucose, Bld 97 70 - 99 mg/dL    Comment: Glucose reference range applies only to samples taken after fasting for at least 8 hours.   BUN 21 8 - 23 mg/dL   Creatinine, Ser 1.38 (H) 0.61 - 1.24 mg/dL   Calcium 8.7 (L) 8.9 - 10.3 mg/dL   Total Protein 6.5 6.5 - 8.1 g/dL   Albumin 3.8 3.5 - 5.0 g/dL   AST 52 (H) 15 - 41 U/L   ALT 49 (H) 0 -  44 U/L   Alkaline Phosphatase 115 38 - 126 U/L   Total Bilirubin 1.8 (H) 0.3 - 1.2 mg/dL   GFR, Estimated 45 (L) >60 mL/min   Anion gap 11 5 - 15    Comment: Performed at [facility], [place] [address] [place], [place] [address]  CBC WITH DIFFERENTIAL     Status: Abnormal   Collection Time: 05/04/20 12:14 PM  Result Value Ref Range   WBC 4.8 4.0 - 10.5 K/uL   RBC 4.02 (L) 4.22 - 5.81 MIL/uL   Hemoglobin 13.6 13.0 - 17.0 g/dL   HCT 38.9 (L) 39 - 52 %   MCV 96.8 80.0 - 100.0 fL   MCH 33.8 26.0 - 34.0 pg   MCHC 35.0 30.0 - 36.0 g/dL   RDW 13.8 11.5 - 15.5 %   Platelets 180 150 - 400 K/uL   nRBC 0.0 0.0 - 0.2 %   Neutrophils Relative % 59 %   Neutro Abs 2.8 1.7 - 7.7 K/uL   Lymphocytes Relative 23 %   Lymphs Abs 1.1 0.7 - 4.0 K/uL   Monocytes Relative 11 %   Monocytes Absolute 0.5 0.1 - 1.0 K/uL   Eosinophils Relative 6 %   Eosinophils Absolute 0.3 0 - 0 K/uL   Basophils Relative 1 %   Basophils Absolute 0.0 0 - 0 K/uL   Immature Granulocytes 0 %   Abs Immature Granulocytes 0.01 0.00 - 0.07 K/uL    Comment: Performed at [facility], [place] [address] [place], [place] [address]  Magnesium     Status: None   Collection Time: 05/04/20 12:14 PM  Result Value Ref Range   Magnesium 2.1 1.7 - 2.4 mg/dL    Comment: Performed at [facility], [place] [address], [place], [place] [address]  Respiratory Panel by RT PCR (Flu A&B, Covid) - Nasopharyngeal Swab     Status: None   Collection Time: 05/04/20  1:51 PM   Specimen: Nasopharyngeal Swab  Result Value Ref Range   SARS Coronavirus 2 by RT PCR NEGATIVE NEGATIVE    Comment: (NOTE) SARS-CoV-2 target nucleic acids are NOT DETECTED.  The SARS-CoV-2 RNA is generally detectable in upper respiratoy specimens during the acute phase of infection. The lowest concentration of SARS-CoV-2 viral copies this assay can detect is 131 copies/mL. A negative result does not preclude  SARS-Cov-2 infection and should not be used as the sole basis for treatment or other patient management decisions. A negative result may occur with  improper specimen collection/handling, submission of specimen other than nasopharyngeal swab, presence of viral mutation(s) within the areas targeted  by this assay, and inadequate number of viral copies (<131 copies/mL). A negative result must be combined with clinical observations, patient history, and epidemiological information. The expected result is Negative.  Fact Sheet for Patients:  PinkCheek.be  Fact Sheet for Healthcare Providers:  GravelBags.it  This test is no t yet approved or cleared by the [place] FDA and  has been authorized for detection and/or diagnosis of SARS-CoV-2 by FDA under an Emergency Use Authorization (EUA). This EUA will remain  in effect (meaning this test can be used) for the duration of the COVID-19 declaration under Section 564(b)(1) of the Act, 21 U.S.C. section 360bbb-3(b)(1), unless the authorization is terminated or revoked sooner.     Influenza A by PCR NEGATIVE NEGATIVE   Influenza B by PCR NEGATIVE NEGATIVE    Comment: (NOTE) The Xpert Xpress SARS-CoV-2/FLU/RSV assay is intended as an aid in  the diagnosis of influenza from Nasopharyngeal swab specimens and  should not be used as a sole basis for treatment. Nasal washings and  aspirates are unacceptable for Xpert Xpress SARS-CoV-2/FLU/RSV  testing.  Fact Sheet for Patients: PinkCheek.be  Fact Sheet for Healthcare Providers: GravelBags.it  This test is not yet approved or cleared by the [place] FDA and  has been authorized for detection and/or diagnosis of SARS-CoV-2 by  FDA under an Emergency Use Authorization (EUA). This EUA will remain  in effect (meaning this test can be used) for the duration of the  Covid-19  declaration under Section 564(b)(1) of the Act, 21  U.S.C. section 360bbb-3(b)(1), unless the authorization is  terminated or revoked. Performed at [facility], [place] [address] [place], [place] [address]    CT Abdomen Pelvis W Contrast  Result Date: 05/04/2020 CLINICAL DATA:  Diverticulitis.  Diarrhea. EXAM: CT ABDOMEN AND PELVIS WITH CONTRAST TECHNIQUE: Multidetector CT imaging of the abdomen and pelvis was performed using the standard protocol following bolus administration of intravenous contrast. CONTRAST:  64mL OMNIPAQUE IOHEXOL 300 MG/ML  SOLN COMPARISON:  01/10/2008 FINDINGS: Lower chest: Pulmonary scarring and peripheral emphysematous change right more than left. No active pulmonary or lower chest process. Hepatobiliary: Previous cholecystectomy. Intra and extrahepatic biliary ductal dilatation, with the common duct measuring up to 2 cm in diameter. Duct is dilated all the way to the M Pugh low. No visible obstructing stone or mass. No evidence of hepatic parenchymal lesion. Pancreas: Negative Spleen: Normal Adrenals/Urinary Tract: Adrenal glands are normal. Kidneys are normal. No cyst, mass, stone or hydronephrosis. Bladder is normal. Stomach/Bowel: Stomach and small intestine are normal. Patient does have some liquid stool in the colon consistent with the history of diarrhea. Very minimal diverticulosis. No sign of diverticulitis. Vascular/Lymphatic: Aortic atherosclerosis. Maximal diameter the infrarenal abdominal aorta 2.7 cm. Reproductive: Normal Other: No free fluid or air. Musculoskeletal: Ordinary lumbar degenerative changes. IMPRESSION: 1. Liquid stool in the colon consistent with the history of diarrhea. No sign of diverticulitis. 2. Previous cholecystectomy. Intra and extrahepatic biliary ductal dilatation, with the common duct measuring up to 2 cm in diameter. No visible obstructing stone or mass. Are there clinical or laboratory concerns regarding biliary  obstruction? Aortic Atherosclerosis (ICD10-I70.0). Electronically Signed   By: Nelson Chimes M.D.   On: 05/04/2020 14:47   DG Chest Port 1 View  Result Date: 05/04/2020 CLINICAL DATA:  COPD.  Hypertension.  Vomiting and diarrhea. EXAM: PORTABLE CHEST 1 VIEW COMPARISON:  12/11/2017 FINDINGS: Artifact overlies the chest. The lungs are hyperinflated consistent with emphysema. Some scattered areas of pulmonary scarring. Evidence  of infiltrate, collapse edema or effusion. Ordinary aortic atherosclerosis. IMPRESSION: No active disease. Emphysema. Aortic atherosclerosis. Electronically Signed   By: Nelson Chimes M.D.   On: 05/04/2020 12:48    Pending Labs Unresulted Labs (From admission, onward)          Start     Ordered   05/11/20 0500  Creatinine, serum  (enoxaparin (LOVENOX)    CrCl < 30 ml/min)  Weekly,   R     Comments: while on enoxaparin therapy.    05/04/20 1611   05/05/20 0500  Comprehensive metabolic panel  Tomorrow morning,   R        05/04/20 1611   05/05/20 0500  CBC  Tomorrow morning,   R        05/04/20 1611   05/04/20 1605  Gastrointestinal Panel by PCR , Stool  (Gastrointestinal Panel by PCR, Stool                                                                                                                                                     *Does Not include CLOSTRIDIUM DIFFICILE testing.**If CDIFF testing is needed, select the C Difficile Quick Screen w PCR reflex order below)  Once,   STAT        05/04/20 1604   05/04/20 1605  C Difficile Quick Screen w PCR reflex  (Gastrointestinal Panel by PCR, Stool                                                                                                                                                     *Does Not include CLOSTRIDIUM DIFFICILE testing.**If CDIFF testing is needed, select the C Difficile Quick Screen w PCR reflex order below)  Once, for 24 hours,   STAT       References:&nbsp;&nbsp;&nbsp;&nbsp;CDiff Information Tool    05/04/20 1604          Vitals/Pain Today's Vitals   05/04/20 1902 05/04/20 1930 05/04/20 2029 05/04/20 2127  BP: (!) 154/73 (!) 111/55 120/65 (!) 123/55  Pulse: 64 (!) 56 62   Resp: 12 20 19    Temp:      TempSrc:  SpO2: 99% 96% 98%   Weight:      Height:      PainSc:        Isolation Precautions Enteric precautions (UV disinfection)  Medications Medications  enoxaparin (LOVENOX) injection 30 mg (30 mg Subcutaneous Given 05/04/20 2128)  0.9 %  sodium chloride infusion ( Intravenous New Bag/Given 05/04/20 1751)  acetaminophen (TYLENOL) tablet 650 mg (has no administration in time range)    Or  acetaminophen (TYLENOL) suppository 650 mg (has no administration in time range)  ondansetron (ZOFRAN) tablet 4 mg (has no administration in time range)    Or  ondansetron (ZOFRAN) injection 4 mg (has no administration in time range)  potassium chloride 10 mEq in 100 mL IVPB (10 mEq Intravenous New Bag/Given 05/04/20 2126)  aspirin EC tablet 81 mg (has no administration in time range)  amiodarone (PACERONE) tablet 300 mg (has no administration in time range)  amLODipine (NORVASC) tablet 10 mg (10 mg Oral Given 05/04/20 2127)  metoprolol succinate (TOPROL-XL) 24 hr tablet 12.5 mg (has no administration in time range)  omega-3 acid ethyl esters (LOVAZA) capsule 1 g (has no administration in time range)  rOPINIRole (REQUIP) tablet 1 mg (has no administration in time range)  0.9 % NaCl with KCl 20 mEq/ L  infusion ( Intravenous Stopped 05/04/20 1751)  iohexol (OMNIPAQUE) 300 MG/ML solution 75 mL (75 mLs Intravenous Contrast Given 05/04/20 1415)  ondansetron (ZOFRAN) injection 4 mg (4 mg Intravenous Given 05/04/20 1504)    Mobility walks

## 2020-05-05 ENCOUNTER — Inpatient Hospital Stay (HOSPITAL_COMMUNITY): Payer: Medicare Other

## 2020-05-05 DIAGNOSIS — R112 Nausea with vomiting, unspecified: Secondary | ICD-10-CM | POA: Diagnosis not present

## 2020-05-05 LAB — COMPREHENSIVE METABOLIC PANEL
ALT: 42 U/L (ref 0–44)
AST: 36 U/L (ref 15–41)
Albumin: 3.1 g/dL — ABNORMAL LOW (ref 3.5–5.0)
Alkaline Phosphatase: 92 U/L (ref 38–126)
Anion gap: 9 (ref 5–15)
BUN: 12 mg/dL (ref 8–23)
CO2: 24 mmol/L (ref 22–32)
Calcium: 8.1 mg/dL — ABNORMAL LOW (ref 8.9–10.3)
Chloride: 107 mmol/L (ref 98–111)
Creatinine, Ser: 1.18 mg/dL (ref 0.61–1.24)
GFR, Estimated: 55 mL/min — ABNORMAL LOW (ref >60)
Glucose, Bld: 91 mg/dL (ref 70–99)
Potassium: 2.6 mmol/L — CL (ref 3.5–5.1)
Sodium: 140 mmol/L (ref 135–145)
Total Bilirubin: 1.5 mg/dL — ABNORMAL HIGH (ref 0.3–1.2)
Total Protein: 5.2 g/dL — ABNORMAL LOW (ref 6.5–8.1)

## 2020-05-05 LAB — C DIFFICILE QUICK SCREEN W PCR REFLEX
C Diff antigen: NEGATIVE
C Diff interpretation: NOT DETECTED
C Diff toxin: NEGATIVE

## 2020-05-05 LAB — CBC
HCT: 32 % — ABNORMAL LOW (ref 39.0–52.0)
Hemoglobin: 10.9 g/dL — ABNORMAL LOW (ref 13.0–17.0)
MCH: 33.4 pg (ref 26.0–34.0)
MCHC: 34.1 g/dL (ref 30.0–36.0)
MCV: 98.2 fL (ref 80.0–100.0)
Platelets: 140 10*3/uL — ABNORMAL LOW (ref 150–400)
RBC: 3.26 MIL/uL — ABNORMAL LOW (ref 4.22–5.81)
RDW: 13.7 % (ref 11.5–15.5)
WBC: 5.2 10*3/uL (ref 4.0–10.5)
nRBC: 0 % (ref 0.0–0.2)

## 2020-05-05 LAB — GLUCOSE, CAPILLARY: Glucose-Capillary: 92 mg/dL (ref 70–99)

## 2020-05-05 MED ORDER — POTASSIUM CHLORIDE 10 MEQ/100ML IV SOLN
10.0000 meq | INTRAVENOUS | Status: AC
  Administered 2020-05-05 (×4): 10 meq via INTRAVENOUS
  Filled 2020-05-05 (×4): qty 100

## 2020-05-05 MED ORDER — POTASSIUM CHLORIDE CRYS ER 20 MEQ PO TBCR
40.0000 meq | EXTENDED_RELEASE_TABLET | Freq: Once | ORAL | Status: AC
  Administered 2020-05-05: 40 meq via ORAL
  Filled 2020-05-05: qty 2

## 2020-05-05 MED ORDER — ENOXAPARIN SODIUM 40 MG/0.4ML ~~LOC~~ SOLN
40.0000 mg | SUBCUTANEOUS | Status: DC
  Administered 2020-05-05 – 2020-05-07 (×3): 40 mg via SUBCUTANEOUS
  Filled 2020-05-05 (×3): qty 0.4

## 2020-05-05 NOTE — Plan of Care (Signed)

## 2020-05-05 NOTE — Progress Notes (Signed)
   05/05/20 0918  Assess: MEWS Score  Temp 97.7 F (36.5 C)  BP (!) 98/50  SpO2 100 %  O2 Device Room Air  Assess: MEWS Score  MEWS Temp 0  MEWS Systolic 1  MEWS Pulse 1  MEWS RR 0  MEWS LOC 0  MEWS Score 2  MEWS Score Color Yellow  Assess: if the MEWS score is Yellow or Red  Were vital signs taken at a resting state? Yes  Focused Assessment No change from prior assessment  Early Detection of Sepsis Score *See Row Information* Low  MEWS guidelines implemented *See Row Information* Yes  Treat  MEWS Interventions Administered scheduled meds/treatments  Take Vital Signs  Increase Vital Sign Frequency  Yellow: Q 2hr X 2 then Q 4hr X 2, if remains yellow, continue Q 4hrs  Escalate  MEWS: Escalate Yellow: discuss with charge nurse/RN and consider discussing with provider and RRT  Notify: Charge Nurse/RN  Name of Charge Nurse/RN Notified Myriam Jacobson RN  Date Charge Nurse/RN Notified 05/05/20  Time Charge Nurse/RN Notified [id number]  Notify: Provider  Provider Name/Title [name]  Date Provider Notified 05/05/20  Time Provider Notified [phone]  Notification Type Page  Notification Reason Other (Comment) (yellow MEWS)

## 2020-05-05 NOTE — Progress Notes (Signed)
Spoke with RN about MRI.  Pt is not NPO will be made NPO at midnight and MRI will be done in the am.

## 2020-05-05 NOTE — Progress Notes (Signed)
PROGRESS NOTE    [name]  NID:[id number] DOB: 1929/05/26 DOA: 05/04/2020 PCP: Thomas Cha, MD     Brief Narrative:  Thomas Doyle is a 86 y.o. male with medical history significant of HTN, Large B-cell lymphoma. Presents with intractable N/V/D. He has had persistent diarrhea for over a month. OTC medications did not help. Changing diet did not help. 2 weeks ago after speaking with his PCP, it was recommended that he go to Urgent Care. He was seen there and received unspecified meds that didn't help. He was able to get in with his PCP last week and was given probiotics. His symptoms seem to have gotten worse. He was weak and lethargic yesterday. His daughter reports that he wasn't coherent. They saw his PCP again today, and he was told to come to ED. At this point, he can not keep food or liquids down. Diarrhea remains persistent.  New events last 24 hours / Subjective: This morning, he states that he is feeling better, has some nausea but no further bowel movements.  Denies any abdominal pain.  Assessment & Plan:   Active Problems:   Intractable nausea and vomiting   Intractable nausea, vomiting, diarrhea -C. difficile negative -GI PCR pending -Continues to have some nausea today but no vomiting.  Continue to monitor on supportive care.  Continue IV fluid  Common bile duct dilatation -GI consulted  Hypokalemia -Replace, trend  Atrial fibrillation -Continue amiodarone, Toprol  Essential hypertension -Continue Norvasc  Restless leg syndrome -Continue Requip  CKD stage IIIa -Stable   DVT prophylaxis:  enoxaparin (LOVENOX) injection 30 mg Start: 05/04/20 2200  Code Status: DNR Family Communication: No family at bedside Disposition Plan:  Status is: Inpatient  Remains inpatient appropriate because:Persistent severe electrolyte disturbances, Ongoing diagnostic testing needed not appropriate for outpatient work up and IV treatments appropriate due to  intensity of illness or inability to take PO   Dispo: The patient is from: Home              Anticipated d/c is to: Home              Anticipated d/c date is: 2 days              Patient currently is not medically stable to d/c.  Remains with nausea.  GI consult pending.  Replace potassium   Consultants:   GI  Procedures:   None  Antimicrobials:  Anti-infectives (From admission, onward)   None        Objective: Vitals:   05/04/20 2201 05/05/20 0210 05/05/20 0617 05/05/20 0918  BP:  (!) 107/57 (!) 111/54 (!) 98/50  Pulse:  (!) 56 (!) 47   Resp:   20   Temp:  98.1 F (36.7 C) 98.2 F (36.8 C) 97.7 F (36.5 C)  TempSrc:  Oral Oral Oral  SpO2:  98% 97% 100%  Weight: 67.7 kg     Height: 5\' 10"  (1.778 m)       Intake/Output Summary (Last 24 hours) at 05/05/2020 1134 Last data filed at 05/05/2020 0500 Gross per 24 hour  Intake 1112.14 ml  Output 550 ml  Net 562.14 ml   Filed Weights   05/04/20 1620 05/04/20 2201  Weight: 66.7 kg 67.7 kg    Examination:  General exam: Appears calm and comfortable  Respiratory system: Clear to auscultation. Respiratory effort normal. No respiratory distress. No conversational dyspnea.  Cardiovascular system: S1 & S2 heard. No murmurs. No pedal edema. Gastrointestinal system: Abdomen  is nondistended, soft and nontender. Normal bowel sounds heard. Central nervous system: Alert and oriented. No focal neurological deficits. Speech clear.  Extremities: Symmetric in appearance  Skin: No rashes, lesions or ulcers on exposed skin  Psychiatry: Judgement and insight appear normal. Mood & affect appropriate.   Data Reviewed: I have personally reviewed following labs and imaging studies  CBC: Recent Labs  Lab 05/04/20 1214 05/05/20 0454  WBC 4.8 5.2  NEUTROABS 2.8  --   HGB 13.6 10.9*  HCT 38.9* 32.0*  MCV 96.8 98.2  PLT 180 563*   Basic Metabolic Panel: Recent Labs  Lab 05/04/20 1214 05/05/20 0454  NA 140 140  K 2.7*  2.6*  CL 105 107  CO2 24 24  GLUCOSE 97 91  BUN 21 12  CREATININE 1.38* 1.18  CALCIUM 8.7* 8.1*  MG 2.1  --    GFR: Estimated Creatinine Clearance: 41.4 mL/min (by C-G formula based on SCr of 1.18 mg/dL). Liver Function Tests: Recent Labs  Lab 05/04/20 1214 05/05/20 0454  AST 52* 36  ALT 49* 42  ALKPHOS 115 92  BILITOT 1.8* 1.5*  PROT 6.5 5.2*  ALBUMIN 3.8 3.1*   No results for input(s): LIPASE, AMYLASE in the last 168 hours. No results for input(s): AMMONIA in the last 168 hours. Coagulation Profile: No results for input(s): INR, PROTIME in the last 168 hours. Cardiac Enzymes: No results for input(s): CKTOTAL, CKMB, CKMBINDEX, TROPONINI in the last 168 hours. BNP (last 3 results) No results for input(s): PROBNP in the last 8760 hours. HbA1C: No results for input(s): HGBA1C in the last 72 hours. CBG: Recent Labs  Lab 05/04/20 1212  GLUCAP 92   Lipid Profile: No results for input(s): CHOL, HDL, LDLCALC, TRIG, CHOLHDL, LDLDIRECT in the last 72 hours. Thyroid Function Tests: No results for input(s): TSH, T4TOTAL, FREET4, T3FREE, THYROIDAB in the last 72 hours. Anemia Panel: No results for input(s): VITAMINB12, FOLATE, FERRITIN, TIBC, IRON, RETICCTPCT in the last 72 hours. Sepsis Labs: No results for input(s): PROCALCITON, LATICACIDVEN in the last 168 hours.  Recent Results (from the past 240 hour(s))  Respiratory Panel by RT PCR (Flu A&B, Covid) - Nasopharyngeal Swab     Status: None   Collection Time: 05/04/20  1:51 PM   Specimen: Nasopharyngeal Swab  Result Value Ref Range Status   SARS Coronavirus 2 by RT PCR NEGATIVE NEGATIVE Final    Comment: (NOTE) SARS-CoV-2 target nucleic acids are NOT DETECTED.  The SARS-CoV-2 RNA is generally detectable in upper respiratoy specimens during the acute phase of infection. The lowest concentration of SARS-CoV-2 viral copies this assay can detect is 131 copies/mL. A negative result does not preclude SARS-Cov-2 infection  and should not be used as the sole basis for treatment or other patient management decisions. A negative result may occur with  improper specimen collection/handling, submission of specimen other than nasopharyngeal swab, presence of viral mutation(s) within the areas targeted by this assay, and inadequate number of viral copies (<131 copies/mL). A negative result must be combined with clinical observations, patient history, and epidemiological information. The expected result is Negative.  Fact Sheet for Patients:  PinkCheek.be  Fact Sheet for Healthcare Providers:  GravelBags.it  This test is no t yet approved or cleared by the [place] FDA and  has been authorized for detection and/or diagnosis of SARS-CoV-2 by FDA under an Emergency Use Authorization (EUA). This EUA will remain  in effect (meaning this test can be used) for the duration of the COVID-19 declaration  under Section 564(b)(1) of the Act, 21 U.S.C. section 360bbb-3(b)(1), unless the authorization is terminated or revoked sooner.     Influenza A by PCR NEGATIVE NEGATIVE Final   Influenza B by PCR NEGATIVE NEGATIVE Final    Comment: (NOTE) The Xpert Xpress SARS-CoV-2/FLU/RSV assay is intended as an aid in  the diagnosis of influenza from Nasopharyngeal swab specimens and  should not be used as a sole basis for treatment. Nasal washings and  aspirates are unacceptable for Xpert Xpress SARS-CoV-2/FLU/RSV  testing.  Fact Sheet for Patients: PinkCheek.be  Fact Sheet for Healthcare Providers: GravelBags.it  This test is not yet approved or cleared by the [place] FDA and  has been authorized for detection and/or diagnosis of SARS-CoV-2 by  FDA under an Emergency Use Authorization (EUA). This EUA will remain  in effect (meaning this test can be used) for the duration of the  Covid-19 declaration  under Section 564(b)(1) of the Act, 21  U.S.C. section 360bbb-3(b)(1), unless the authorization is  terminated or revoked. Performed at [facility], [place] [address], [place], [place] [address]   C Difficile Quick Screen w PCR reflex     Status: None   Collection Time: 05/05/20 10:07 AM   Specimen: Stool  Result Value Ref Range Status   C Diff antigen NEGATIVE NEGATIVE Final   C Diff toxin NEGATIVE NEGATIVE Final   C Diff interpretation No C. difficile detected.  Final    Comment: Performed at [facility], [place] [address], [place], [place] [address]      Radiology Studies: CT Abdomen Pelvis W Contrast  Result Date: 05/04/2020 CLINICAL DATA:  Diverticulitis.  Diarrhea. EXAM: CT ABDOMEN AND PELVIS WITH CONTRAST TECHNIQUE: Multidetector CT imaging of the abdomen and pelvis was performed using the standard protocol following bolus administration of intravenous contrast. CONTRAST:  60mL OMNIPAQUE IOHEXOL 300 MG/ML  SOLN COMPARISON:  01/10/2008 FINDINGS: Lower chest: Pulmonary scarring and peripheral emphysematous change right more than left. No active pulmonary or lower chest process. Hepatobiliary: Previous cholecystectomy. Intra and extrahepatic biliary ductal dilatation, with the common duct measuring up to 2 cm in diameter. Duct is dilated all the way to the M Pugh low. No visible obstructing stone or mass. No evidence of hepatic parenchymal lesion. Pancreas: Negative Spleen: Normal Adrenals/Urinary Tract: Adrenal glands are normal. Kidneys are normal. No cyst, mass, stone or hydronephrosis. Bladder is normal. Stomach/Bowel: Stomach and small intestine are normal. Patient does have some liquid stool in the colon consistent with the history of diarrhea. Very minimal diverticulosis. No sign of diverticulitis. Vascular/Lymphatic: Aortic atherosclerosis. Maximal diameter the infrarenal abdominal aorta 2.7 cm. Reproductive: Normal Other: No free fluid or air.  Musculoskeletal: Ordinary lumbar degenerative changes. IMPRESSION: 1. Liquid stool in the colon consistent with the history of diarrhea. No sign of diverticulitis. 2. Previous cholecystectomy. Intra and extrahepatic biliary ductal dilatation, with the common duct measuring up to 2 cm in diameter. No visible obstructing stone or mass. Are there clinical or laboratory concerns regarding biliary obstruction? Aortic Atherosclerosis (ICD10-I70.0). Electronically Signed   By: Nelson Chimes M.D.   On: 05/04/2020 14:47   DG Chest Port 1 View  Result Date: 05/04/2020 CLINICAL DATA:  COPD.  Hypertension.  Vomiting and diarrhea. EXAM: PORTABLE CHEST 1 VIEW COMPARISON:  12/11/2017 FINDINGS: Artifact overlies the chest. The lungs are hyperinflated consistent with emphysema. Some scattered areas of pulmonary scarring. Evidence of infiltrate, collapse edema or effusion. Ordinary aortic atherosclerosis. IMPRESSION: No active disease. Emphysema. Aortic atherosclerosis. Electronically  Signed   By: Nelson Chimes M.D.   On: 05/04/2020 12:48      Scheduled Meds: . amiodarone  300 mg Oral Daily  . amLODipine  10 mg Oral Daily  . aspirin EC  81 mg Oral Daily  . enoxaparin (LOVENOX) injection  30 mg Subcutaneous Q24H  . metoprolol succinate  12.5 mg Oral Daily  . omega-3 acid ethyl esters  1 capsule Oral Daily   Continuous Infusions: . sodium chloride 75 mL/hr at 05/05/20 0440     LOS: 1 day      Time spent: 35 minutes   Dessa Phi, DO [facility] 05/05/2020, 11:34 AM   Available via Epic secure chat 7am-7pm After these hours, please refer to coverage provider listed on [email or web address]

## 2020-05-05 NOTE — Consult Note (Signed)
Referring Provider: Dr. Dessa Phi Primary Care Physician:  Leeroy Cha, MD Primary Gastroenterologist: [facility] (previous patient of Dr. Hassell Done)  Reason for Consultation: Abnormal CT, nausea/vomiting/diarrhea  HPI: Thomas Doyle is a 86 y.o. male with history of B-cell lymphoma and remote cholecystectomy presenting with nausea, vomiting, and diarrhea.  Patient reports persistent diarrhea daily for the last month.  He states he has had anywhere from 10-15 stools per day over the last week.  However, since admission, he states he has not had a bowel movement.  Denies any melena or hematochezia.  Denies abdominal pain.  Also reports nausea and vomiting worsening over the past month.  Has had intermittent episodes of vomiting but denies hematemesis or coffee-ground emesis.  Denies any GERD, dysphagia.  Reports he has lost at least 20 pounds in the last 1 month which she attributes to decreased appetite and persistent diarrhea.  He is not eating as much due to diarrhea after eating, as well as recent nausea and vomiting.  Denies family history of colon cancer or gastrointestinal malignancy.  Last colonoscopy in 2009 revealed diverticulosis but was otherwise normal.  Past Medical History:  Diagnosis Date  . AAA (abdominal aortic aneurysm) (HCC)    intrarenal  . Anemia   . COPD (chronic obstructive pulmonary disease) [facility]) dx April 2013  . Hypertension   . Large B-cell lymphoma ([place]) 11/2011  . Mitral valve prolapse   . Non Hodgkin's lymphoma ([facility])   . PAF (paroxysmal atrial fibrillation) (HCC)    On amiodarone, digoxin. Not on coumadin  . Pneumonia April 2013  . Right bundle branch block (RBBB)   . Sick sinus syndrome [facility]     Past Surgical History:  Procedure Laterality Date  . abdominal aorta aneurysm duplex  11/20/2012   normal - demonstrates normal taper  . CARDIAC CATHETERIZATION  09/02/2010   normal LV function, normal coronary arteries, irregularity of the distal  aorta with a small aneursymal dilatation  . CARDIOVASCULAR STRESS TEST  08/20/2010   evidence of mild-moderate ischemia in Basal inferoseptal, Basal inferior, Mid inferoseptal and Mid inferior regions. EKG negative for ischemia. Patient developed RBBB during exercise and transient ventricular bigeminal rhythm  . CHEST TUBE INSERTION  1990's   fell  thru deck, punctured lung, chest tube  . CHOLECYSTECTOMY  1980's  . CIRCUMCISION  age 77  . DOPPLER ECHOCARDIOGRAPHY  04/05/2012   EF 63%, borderline MVP with mild mitral regurg  . TONSILLECTOMY      Prior to Admission medications   Medication Sig Start Date End Date Taking? Authorizing Provider  amiodarone (PACERONE) 200 MG tablet TAKE ONE AND ONE-HALF (1 & 1/2) TABLET BY MOUTH DAILY Patient taking differently: Take 300 mg by mouth daily.  08/09/19  Yes Almyra Deforest, PA  amLODipine (NORVASC) 10 MG tablet Take 1 tablet (10 mg total) by mouth daily. 08/09/19 05/04/20 Yes Almyra Deforest, PA  aspirin 81 MG tablet Take 81 mg by mouth daily.   Yes [provider]  cholecalciferol (VITAMIN D3) 25 MCG (1000 UT) tablet Take 1,000 Units by mouth daily.   Yes [provider]  metoprolol succinate (TOPROL-XL) 25 MG 24 hr tablet Take 1 tablet (25 mg total) by mouth daily. ** DO NOT CRUSH **    (BETA BLOCKER) Patient taking differently: Take 12.5 mg by mouth daily.  08/09/19  Yes Meng, Isaac Laud, PA  omega-3 acid ethyl esters (LOVAZA) 1 g capsule Take 1 capsule (1 g total) by mouth daily. 08/09/19  Yes Almyra Deforest,  PA  rOPINIRole (REQUIP) 1 MG tablet Take 1 mg by mouth daily as needed (for restless legs).    Yes [provider]  tamsulosin (FLOMAX) 0.4 MG CAPS capsule Take 0.4 mg by mouth See admin instructions. 3 times weekly. 11/11/17  Yes [provider]  vitamin B-12 (CYANOCOBALAMIN) 1000 MCG tablet Take 1,000 mcg by mouth daily.   Yes [provider]  zolpidem (AMBIEN) 5 MG tablet Take 1 tablet (5 mg total) by mouth at bedtime as  needed. for sleep Patient not taking: Reported on 05/04/2020 10/20/17   Troy Sine, MD    Scheduled Meds: . amiodarone  300 mg Oral Daily  . amLODipine  10 mg Oral Daily  . aspirin EC  81 mg Oral Daily  . enoxaparin (LOVENOX) injection  30 mg Subcutaneous Q24H  . metoprolol succinate  12.5 mg Oral Daily  . omega-3 acid ethyl esters  1 capsule Oral Daily   Continuous Infusions: . sodium chloride 75 mL/hr at 05/05/20 0440   PRN Meds:.acetaminophen **OR** acetaminophen, melatonin, ondansetron **OR** ondansetron (ZOFRAN) IV, rOPINIRole  Allergies as of 05/04/2020 - Review Complete 05/04/2020  Allergen Reaction Noted  . Codeine Nausea Only 12/11/2017    Family History  Problem Relation Age of Onset  . Atrial fibrillation Mother   . Cancer Father   . Heart attack Father     Social History   Socioeconomic History  . Marital status: Widowed    Spouse name: Not on file  . Number of children: 2  . Years of education: 51  . Highest education level: Not on file  Occupational History  . Occupation: Retired from [employer]  . Smoking status: Never Smoker  . Smokeless tobacco: Never Used  Substance and Sexual Activity  . Alcohol use: No  . Drug use: No  . Sexual activity: Not on file  Other Topics Concern  . Not on file  Social History Narrative   Widowed x 3 years.  Lives alone.  Independent of ADLs and ambulation.  Emergency Contact: Rayn Enderson ([place]): [phone]. Has two daughters. Still living at home, doesn't use cane/walker.             Social Determinants of Health   Financial Resource Strain:   . Difficulty of Paying Living Expenses: Not on file  Food Insecurity:   . Worried About Charity fundraiser in the Last Year: Not on file  . Ran Out of Food in the Last Year: Not on file  Transportation Needs:   . Lack of Transportation (Medical): Not on file  . Lack of Transportation (Non-Medical): Not on file  Physical Activity:   . Days of Exercise per  Week: Not on file  . Minutes of Exercise per Session: Not on file  Stress:   . Feeling of Stress : Not on file  Social Connections:   . Frequency of Communication with Friends and Family: Not on file  . Frequency of Social Gatherings with Friends and Family: Not on file  . Attends Religious Services: Not on file  . Active Member of Clubs or Organizations: Not on file  . Attends Archivist Meetings: Not on file  . Marital Status: Not on file  Intimate Partner Violence:   . Fear of Current or Ex-Partner: Not on file  . Emotionally Abused: Not on file  . Physically Abused: Not on file  . Sexually Abused: Not on file    Review of Systems: Review of Systems  Constitutional: Positive for malaise/fatigue and weight loss. Negative for chills and fever.  HENT: Negative for hearing loss and tinnitus.   Eyes: Negative for pain and redness.  Respiratory: Negative for cough and shortness of breath.   Cardiovascular: Negative for chest pain and palpitations.  Gastrointestinal: Positive for diarrhea, nausea and vomiting. Negative for abdominal pain, blood in stool, constipation, heartburn and melena.  Genitourinary: Negative for flank pain and hematuria.  Musculoskeletal: Negative for falls and joint pain.  Skin: Negative for itching and rash.  Neurological: Negative for seizures and loss of consciousness.  Endo/Heme/Allergies: Negative for polydipsia. Does not bruise/bleed easily.  Psychiatric/Behavioral: Negative for substance abuse. The patient is not nervous/anxious.     Physical Exam: Vital signs: Vitals:   05/05/20 0617 05/05/20 0918  BP: (!) 111/54 (!) 98/50  Pulse: (!) 47   Resp: 20   Temp: 98.2 F (36.8 C) 97.7 F (36.5 C)  SpO2: 97% 100%   Last BM Date: 05/04/20  Physical Exam Constitutional:      General: He is not in acute distress.    Appearance: Normal appearance.  HENT:     Head: Normocephalic and atraumatic.     Nose: Nose normal.     Mouth/Throat:      Mouth: Mucous membranes are moist.     Pharynx: Oropharynx is clear.  Eyes:     General: No scleral icterus.    Extraocular Movements: Extraocular movements intact.     Conjunctiva/sclera: Conjunctivae normal.  Cardiovascular:     Rate and Rhythm: Regular rhythm. Bradycardia present.     Pulses: Normal pulses.     Heart sounds: Normal heart sounds.  Pulmonary:     Effort: Pulmonary effort is normal. No respiratory distress.     Breath sounds: Normal breath sounds.  Abdominal:     General: Bowel sounds are normal. There is no distension.     Palpations: Abdomen is soft. There is mass (Round, firm, non-tender lesion in epigastrium).     Tenderness: There is no abdominal tenderness. There is no guarding or rebound.     Hernia: No hernia is present.  Musculoskeletal:        General: No swelling or tenderness.     Cervical back: Normal range of motion and neck supple.  Skin:    General: Skin is warm and dry.  Neurological:     General: No focal deficit present.     Mental Status: He is alert and oriented to person, place, and time.  Psychiatric:        Mood and Affect: Mood normal.        Behavior: Behavior normal.      GI:  Lab Results: Recent Labs    05/04/20 1214 05/05/20 0454  WBC 4.8 5.2  HGB 13.6 10.9*  HCT 38.9* 32.0*  PLT 180 140*   BMET Recent Labs    05/04/20 1214 05/05/20 0454  NA 140 140  K 2.7* 2.6*  CL 105 107  CO2 24 24  GLUCOSE 97 91  BUN 21 12  CREATININE 1.38* 1.18  CALCIUM 8.7* 8.1*   LFT Recent Labs    05/05/20 0454  PROT 5.2*  ALBUMIN 3.1*  AST 36  ALT 42  ALKPHOS 92  BILITOT 1.5*   PT/INR No results for input(s): LABPROT, INR in the last 72 hours.   Studies/Results: CT Abdomen Pelvis W Contrast  Result Date: 05/04/2020 CLINICAL DATA:  Diverticulitis.  Diarrhea. EXAM: CT ABDOMEN AND PELVIS WITH CONTRAST TECHNIQUE: Multidetector CT  imaging of the abdomen and pelvis was performed using the standard protocol following bolus  administration of intravenous contrast. CONTRAST:  45mL OMNIPAQUE IOHEXOL 300 MG/ML  SOLN COMPARISON:  01/10/2008 FINDINGS: Lower chest: Pulmonary scarring and peripheral emphysematous change right more than left. No active pulmonary or lower chest process. Hepatobiliary: Previous cholecystectomy. Intra and extrahepatic biliary ductal dilatation, with the common duct measuring up to 2 cm in diameter. Duct is dilated all the way to the M Pugh low. No visible obstructing stone or mass. No evidence of hepatic parenchymal lesion. Pancreas: Negative Spleen: Normal Adrenals/Urinary Tract: Adrenal glands are normal. Kidneys are normal. No cyst, mass, stone or hydronephrosis. Bladder is normal. Stomach/Bowel: Stomach and small intestine are normal. Patient does have some liquid stool in the colon consistent with the history of diarrhea. Very minimal diverticulosis. No sign of diverticulitis. Vascular/Lymphatic: Aortic atherosclerosis. Maximal diameter the infrarenal abdominal aorta 2.7 cm. Reproductive: Normal Other: No free fluid or air. Musculoskeletal: Ordinary lumbar degenerative changes. IMPRESSION: 1. Liquid stool in the colon consistent with the history of diarrhea. No sign of diverticulitis. 2. Previous cholecystectomy. Intra and extrahepatic biliary ductal dilatation, with the common duct measuring up to 2 cm in diameter. No visible obstructing stone or mass. Are there clinical or laboratory concerns regarding biliary obstruction? Aortic Atherosclerosis (ICD10-I70.0). Electronically Signed   By: Nelson Chimes M.D.   On: 05/04/2020 14:47   DG Chest Port 1 View  Result Date: 05/04/2020 CLINICAL DATA:  COPD.  Hypertension.  Vomiting and diarrhea. EXAM: PORTABLE CHEST 1 VIEW COMPARISON:  12/11/2017 FINDINGS: Artifact overlies the chest. The lungs are hyperinflated consistent with emphysema. Some scattered areas of pulmonary scarring. Evidence of infiltrate, collapse edema or effusion. Ordinary aortic  atherosclerosis. IMPRESSION: No active disease. Emphysema. Aortic atherosclerosis. Electronically Signed   By: Nelson Chimes M.D.   On: 05/04/2020 12:48    Impression: Nausea/vomiting/diarrhea: GI pathogen and C. difficile panel pending -CT 10/11: liquid stool in the colon, no sign of diverticulitis  Abnormal CT: Intra and extrahepatic biliary ductal dilatation, with the common duct measuring up to 2 cm in diameter. No visible obstructing stone or mass.  History of remote cholecystectomy.  No opioid use. -T bili mildly elevated to 1.5, otherwise normal LFTs today (LFTs mildly elevated yesterday: T bili 1.8/ AST 52/ ALT 49/ ALP 115 -Normal WBCs (5.2)  Hypokalemia: presumably from diarrhea.  Potassium 2.6 today  Plan: Await C. difficile and GI pathogen panel.  Proceed with MRI/MRCP today due to biliary dilation of unknown origin, as well as persistent nausea and vomiting.  Continue supportive care.  [facility] will follow.   LOS: 1 day   Salley Slaughter  PA-C 05/05/2020, 11:57 AM  Contact #  [phone]

## 2020-05-05 NOTE — Plan of Care (Signed)
  Problem: Nutrition: Goal: Adequate nutrition will be maintained Outcome: Not Progressing   Problem: Coping: Goal: Level of anxiety will decrease Outcome: Not Progressing

## 2020-05-06 ENCOUNTER — Inpatient Hospital Stay (HOSPITAL_COMMUNITY): Payer: Medicare Other

## 2020-05-06 DIAGNOSIS — I10 Essential (primary) hypertension: Secondary | ICD-10-CM

## 2020-05-06 DIAGNOSIS — G2581 Restless legs syndrome: Secondary | ICD-10-CM | POA: Diagnosis present

## 2020-05-06 DIAGNOSIS — R112 Nausea with vomiting, unspecified: Secondary | ICD-10-CM | POA: Diagnosis not present

## 2020-05-06 DIAGNOSIS — E876 Hypokalemia: Secondary | ICD-10-CM | POA: Diagnosis not present

## 2020-05-06 DIAGNOSIS — R197 Diarrhea, unspecified: Secondary | ICD-10-CM | POA: Diagnosis not present

## 2020-05-06 DIAGNOSIS — I482 Chronic atrial fibrillation, unspecified: Secondary | ICD-10-CM

## 2020-05-06 LAB — GASTROINTESTINAL PANEL BY PCR, STOOL (REPLACES STOOL CULTURE)

## 2020-05-06 LAB — HEPATIC FUNCTION PANEL
ALT: 44 U/L (ref 0–44)
AST: 44 U/L — ABNORMAL HIGH (ref 15–41)
Albumin: 2.9 g/dL — ABNORMAL LOW (ref 3.5–5.0)
Alkaline Phosphatase: 84 U/L (ref 38–126)
Bilirubin, Direct: 0.2 mg/dL (ref 0.0–0.2)
Indirect Bilirubin: 0.9 mg/dL (ref 0.3–0.9)
Total Bilirubin: 1.1 mg/dL (ref 0.3–1.2)
Total Protein: 5 g/dL — ABNORMAL LOW (ref 6.5–8.1)

## 2020-05-06 LAB — BASIC METABOLIC PANEL
Anion gap: 9 (ref 5–15)
BUN: 9 mg/dL (ref 8–23)
CO2: 21 mmol/L — ABNORMAL LOW (ref 22–32)
Calcium: 8.2 mg/dL — ABNORMAL LOW (ref 8.9–10.3)
Chloride: 113 mmol/L — ABNORMAL HIGH (ref 98–111)
Creatinine, Ser: 1.11 mg/dL (ref 0.61–1.24)
GFR, Estimated: 59 mL/min — ABNORMAL LOW (ref >60)
Glucose, Bld: 90 mg/dL (ref 70–99)
Potassium: 3 mmol/L — ABNORMAL LOW (ref 3.5–5.1)
Sodium: 143 mmol/L (ref 135–145)

## 2020-05-06 LAB — CBC
HCT: 32.8 % — ABNORMAL LOW (ref 39.0–52.0)
Hemoglobin: 11.1 g/dL — ABNORMAL LOW (ref 13.0–17.0)
MCH: 33.8 pg (ref 26.0–34.0)
MCHC: 33.8 g/dL (ref 30.0–36.0)
MCV: 100 fL (ref 80.0–100.0)
Platelets: 144 10*3/uL — ABNORMAL LOW (ref 150–400)
RBC: 3.28 MIL/uL — ABNORMAL LOW (ref 4.22–5.81)
RDW: 13.7 % (ref 11.5–15.5)
WBC: 4.7 10*3/uL (ref 4.0–10.5)
nRBC: 0 % (ref 0.0–0.2)

## 2020-05-06 LAB — PHOSPHORUS: Phosphorus: 2 mg/dL — ABNORMAL LOW (ref 2.5–4.6)

## 2020-05-06 LAB — MAGNESIUM: Magnesium: 1.7 mg/dL (ref 1.7–2.4)

## 2020-05-06 MED ORDER — POTASSIUM CHLORIDE CRYS ER 20 MEQ PO TBCR
40.0000 meq | EXTENDED_RELEASE_TABLET | ORAL | Status: AC
  Administered 2020-05-06 (×2): 40 meq via ORAL
  Filled 2020-05-06 (×2): qty 2

## 2020-05-06 MED ORDER — CHOLESTYRAMINE LIGHT 4 G PO PACK: 4.0000 g | PACK | Freq: Every day | ORAL | Status: DC

## 2020-05-06 MED ORDER — BOOST / RESOURCE BREEZE PO LIQD CUSTOM
1.0000 | Freq: Two times a day (BID) | ORAL | Status: DC
  Administered 2020-05-07 – 2020-05-08 (×3): 1 via ORAL

## 2020-05-06 MED ORDER — MAGNESIUM SULFATE 4 GM/100ML IV SOLN
4.0000 g | Freq: Once | INTRAVENOUS | Status: AC
  Administered 2020-05-06: 4 g via INTRAVENOUS
  Filled 2020-05-06: qty 100

## 2020-05-06 MED ORDER — ADULT MULTIVITAMIN W/MINERALS CH
1.0000 | ORAL_TABLET | Freq: Every day | ORAL | Status: DC
  Administered 2020-05-06 – 2020-05-08 (×3): 1 via ORAL
  Filled 2020-05-06 (×3): qty 1

## 2020-05-06 MED ORDER — POTASSIUM PHOSPHATES 15 MMOLE/5ML IV SOLN
30.0000 mmol | Freq: Once | INTRAVENOUS | Status: AC
  Administered 2020-05-06: 30 mmol via INTRAVENOUS
  Filled 2020-05-06: qty 10

## 2020-05-06 MED ORDER — GADOBUTROL 1 MMOL/ML IV SOLN
7.0000 mL | Freq: Once | INTRAVENOUS | Status: AC | PRN
  Administered 2020-05-06: 7 mL via INTRAVENOUS

## 2020-05-06 MED ORDER — AZITHROMYCIN 250 MG PO TABS
500.0000 mg | ORAL_TABLET | Freq: Every day | ORAL | Status: DC
  Administered 2020-05-06 – 2020-05-08 (×3): 500 mg via ORAL
  Filled 2020-05-06 (×3): qty 2

## 2020-05-06 MED ORDER — KATE FARMS STANDARD 1.4 PO LIQD
325.0000 mL | Freq: Every day | ORAL | Status: DC
  Administered 2020-05-06: 325 mL via ORAL
  Filled 2020-05-06 (×3): qty 325

## 2020-05-06 MED ORDER — PROSOURCE PLUS PO LIQD
30.0000 mL | Freq: Every day | ORAL | Status: DC
  Administered 2020-05-06 – 2020-05-07 (×2): 30 mL via ORAL
  Filled 2020-05-06 (×2): qty 30

## 2020-05-06 NOTE — Progress Notes (Signed)
Chaplain visited patient and offered ministry of presence and prayer. Rev. Tamsen Snider Pager [phone]

## 2020-05-06 NOTE — Progress Notes (Signed)
[facility] Gastroenterology Progress Note  Thomas Doyle 86 y.o. 07/29/1928  CC:  Nausea/vomiting/diarrhea  Subjective: Patient reports continued diarrhea, stating he had at least 6 loose stools yesterday and 2 thus far today.  Denies any abdominal pain.  Reports continued nausea but no vomiting.  States he wants to advance his diet.  ROS : Review of Systems  Cardiovascular: Negative for chest pain and palpitations.  Gastrointestinal: Positive for diarrhea and nausea. Negative for abdominal pain, blood in stool, constipation, heartburn, melena and vomiting.   Objective: Vital signs in last 24 hours: Vitals:   05/06/20 0456 05/06/20 0913  BP: 118/62 120/60  Pulse: (!) 52 (!) 58  Resp: 18   Temp: 98 F (36.7 C)   SpO2: 96%     Physical Exam:  General:  Lethargic, elderly,, oriented, cooperative, no acute distress  Head:  Normocephalic, without obvious abnormality, atraumatic  Eyes:  Anicteric sclera, EOMs intact  Lungs:   Clear to auscultation bilaterally, respirations unlabored  Heart:  Mildly bradycardic with regular rhythm, S1, S2 normal  Abdomen:   Soft, non-tender, non-distended, bowel sounds active all four quadrants,  no guarding or peritoneal  Extremities: Extremities normal, atraumatic, no  edema  Pulses: 2+ and symmetric    Lab Results: Recent Labs    05/04/20 1214 05/04/20 1214 05/05/20 0454 05/06/20 0522  NA 140   < > 140 143  K 2.7*   < > 2.6* 3.0*  CL 105   < > 107 113*  CO2 24   < > 24 21*  GLUCOSE 97   < > 91 90  BUN 21   < > 12 9  CREATININE 1.38*   < > 1.18 1.11  CALCIUM 8.7*   < > 8.1* 8.2*  MG 2.1  --   --  1.7  PHOS  --   --   --  2.0*   < > = values in this interval not displayed.   Recent Labs    05/05/20 0454 05/06/20 0522  AST 36 44*  ALT 42 44  ALKPHOS 92 84  BILITOT 1.5* 1.1  PROT 5.2* 5.0*  ALBUMIN 3.1* 2.9*   Recent Labs    05/04/20 1214 05/04/20 1214 05/05/20 0454 05/06/20 0522  WBC 4.8   < > 5.2 4.7  NEUTROABS 2.8  --    --   --   HGB 13.6   < > 10.9* 11.1*  HCT 38.9*   < > 32.0* 32.8*  MCV 96.8   < > 98.2 100.0  PLT 180   < > 140* 144*   < > = values in this interval not displayed.   No results for input(s): LABPROT, INR in the last 72 hours.    Assessment/Plan: Nausea/vomiting/diarrhea: Possibly infectious gastroenteritis.  GI pathogen and C. difficile panel pending -CT 10/11: liquid stool in the colon, no sign of diverticulitis -Normal kidney function: BUN 9/Cr 1.11  Abnormal CT: Intra and extrahepatic biliary ductal dilatation, with the common duct measuring up to 2 cm in diameter. No visible obstructing stone or mass.  History of remote cholecystectomy.  No opioid use. -Today, mildly elevated AST (44) otherwise normal LFTs  -Normal WBCs (4.7) -MRI/MRCP completed this morning, pending  Hypokalemia: presumably from diarrhea.  Potassium 3.0 today  Plan: Await GI pathogen panel.  Initiate cholestyramine tonight, presuming GI pathogen panel is negative.  Discussed proceeding with EGD due to persistent nausea.  Patient declines EGD at this time, stating he wants to attempt to advance his  diet.  We will rediscuss EGD if symptoms persist or worsen.  Continue supportive care.  [facility] will follow.  Salley Slaughter PA-C 05/06/2020, 10:01 AM  Contact #  [phone]

## 2020-05-06 NOTE — Progress Notes (Signed)
Initial Nutrition Assessment  DOCUMENTATION CODES:   Not applicable  INTERVENTION:  - will order Boost Breeze BID, each supplement provides 250 kcal and 9 grams of protein. - will order Dillard Essex 1.4 po once/day, each supplement provides 455 kcal and 20 grams protein.  - will order 30 ml Prosource Plus once/day, each supplement provides 100 kcal and 15 grams protein.  - will order 1 tablet multivitamin with minerals/day. - will completion NFPE at follow-up.  NUTRITION DIAGNOSIS:   Inadequate oral intake related to acute illness, nausea, diarrhea, vomiting as evidenced by per patient/family report.  GOAL:   Patient will meet greater than or equal to 90% of their needs  MONITOR:   PO intake, Supplement acceptance, Labs, Weight trends  REASON FOR ASSESSMENT:   Malnutrition Screening Tool  ASSESSMENT:   86 y.o. male with medical history of HTN, anemia, COPD, mitral valve prolapse, SSS, afib, AAA, and large B-cell non-Hodgkin's lymphoma. He presented to the ED due to intractable N/V/D for 1 month. OTC medications, probiotics, and a change in diet did not help/provide relief. Symptoms worsened to the point of being unable to keep down foods or beverages.  Diet advanced from CLD to Soft today at 0930. He consumed 0% of dinner last night on CLD and no other intakes documented since admission.   Unable to see patient at this time. MST report note indicates that patient reported losing 20 lb in the past few months and that most of weight loss has occurred over the past 2 weeks.  Weight on 10/11 was 149 lb and PTA the most recently documented weight was on 08/09/19 when he weighed 159 lb. This indicates 10 lb weight loss (6.3% body weight).   Per notes: - cdiff negative - GI PCR--positive for campylobacter  - CBD dilatation - declines EGD - hx of stage 3 CKD   Labs reviewed; K: 3 mmol/l, Cl: 113 mmol/l, Ca: 8.2 mg/dl, Phos: 2 mg/dl. Medications reviewed; 4 g IV Mg sulfate x1 run  10/13, 1 g lovaza/day, 40 mEq Klor-Con x2 doses 10/13, 30 mmol IV KPhos x1 run 10/13. IVF; NS @ 75 ml/hr.    NUTRITION - FOCUSED PHYSICAL EXAM:  unable to complete at this time.   Diet Order:   Diet Order            DIET SOFT Room service appropriate? Yes; Fluid consistency: Thin  Diet effective now                 EDUCATION NEEDS:   No education needs have been identified at this time  Skin:  Skin Assessment: Reviewed RN Assessment  Last BM:  10/13 (type 7)  Height:   Ht Readings from Last 1 Encounters:  05/04/20 5\' 10"  (1.778 m)    Weight:   Wt Readings from Last 1 Encounters:  05/04/20 67.7 kg    Estimated Nutritional Needs:  Kcal:  1650-1875 kcal Protein:  80-90 grams Fluid:  >/= 2.2 L/day      Jarome Matin, MS, RD, LDN, CNSC Inpatient Clinical Dietitian RD pager # available in [facility]  After hours/weekend pager # available in [facility]

## 2020-05-06 NOTE — Progress Notes (Signed)
Dr. Grandville Silos and GI MD Dr. Alessandra Bevels updated regarding GI panel positive results. New orders to be placed

## 2020-05-06 NOTE — Progress Notes (Signed)
GI pathogen panel positive for Campylobacter.  Start azithromycin 500 mg daily for 5 days.  We will hold off on initiation of cholestyramine at this time.  [facility] will follow.  Thomas Doyle [facility] Gastroenterology [phone]

## 2020-05-06 NOTE — Progress Notes (Signed)
PROGRESS NOTE    Thomas Doyle  DJT:[id number] DOB: 27-Jan-1929 DOA: 05/04/2020 PCP: Leeroy Cha, MD   Chief Complaint  Patient presents with  . Emesis  . Diarrhea    Brief Narrative:  Thomas [name] a 86 y.o.malewith medical history significant ofHTN, Large B-cell lymphoma. Presents with intractable N/V/D. He has had persistent diarrhea for over a month. OTC medications did not help. Changing diet did not help.2 weeks ago after speaking with his PCP, it was recommended that he go to Urgent Care.He was seen there and receivedunspecified meds that didn't help. He was able to get in with hisPCP last week and was given probiotics.His symptoms seem to have gotten worse.He was weak and lethargic yesterday.His daughter reports that he wasn't coherent.They sawhisPCPagaintoday, and he was told tocome to ED. At this point, he can not keep food or liquids down. Diarrhea remains persistent.   Assessment & Plan:   Active Problems:   Intractable nausea and vomiting   Diarrhea   Hypokalemia   Hypomagnesemia   Hypophosphatemia   Atrial fibrillation, chronic (HCC)   Restless leg syndrome  1 nausea/ vomiting/ diarrhea  secondary to campylobacter infection as noted on GI pathogen panel.  C. difficile PCR was negative.  Patient with some clinical improvement with no further emesis with improvement with nausea.  Patient does endorse some diarrhea.  CT abdomen and pelvis done on admission with liquid stool in the colon, no diverticulitis, previous cholecystectomy, intra and extra hepatic biliary ductal dilatation with no visible obstructing stone or mass.  MRI abdomen which was done with common bile duct dilatation of 18 mm without any obstructive findings consistent with postcholecystectomy, small hyperenhancing lesion in the right hepatic lobe.  Patient seen by GI this morning EGD offered but patient declined and patient wanted to be tried on soft food and to advance to  regular diet if able to.  Patient initially requested cholestyramine which was ordered however subsequently discontinued as GI pathogen panel came back positive for campylobacter infection.  Patient has been started on azithromycin per GI recommendations.  Supportive care.  Follow.  2.  Hypokalemia/hypomagnesemia/hypophosphatemia Replete.  K. Dur 40 mEq p.o. every 4 hours x2 doses, K-Phos 30 mmol IV x1, magnesium sulfate 4 g IV x1.  3.  Atrial fibrillation Patient noted to have some bradycardia with heart rates in the 40s to the 50s and as such we will discontinue Toprol-XL.  Continue amiodarone.  Follow.  4.  Hypertension Norvasc.  Toprol-XL discontinued due to bradycardia.  5.  Restless leg syndrome Requip.  6.  Chronic kidney disease stage IIIa Stable.    DVT prophylaxis: Lovenox Code Status: DNR Family Communication: Updated patient.  No family at bedside. Disposition:   Status is: Inpatient    Dispo: The patient is from: Home              Anticipated d/c is to: Home              Anticipated d/c date is: 2 to 3 days              Patient currently with diarrhea, electrolyte abnormalities, started on antibiotics, not stable for discharge.       Consultants:   Gastroenterology: Dr. Alessandra Bevels 05/05/2020  Procedures:   CT abdomen and pelvis 05/04/2020  Chest x-ray 05/04/2020  MRI abdomen 05/06/2020  Antimicrobials:   Azithromycin 05/06/2020   Subjective: Patient laying in bed.  States some improvement with nausea.  Denies any emesis.  Still with  watery loose stools.  Denies any chest pain or shortness of breath.  No significant abdominal pain.  Wanting his diet to be advanced.  States he has been placed on cholestyramine per GI this evening and will reassess in terms of his GI symptoms tomorrow.  Objective: Vitals:   05/06/20 0456 05/06/20 0913 05/06/20 1313 05/06/20 2018  BP: 118/62 120/60 (!) 131/57 (!) 127/51  Pulse: (!) 52 (!) 58 (!) 59 (!) 53  Resp:  18  17 18   Temp: 98 F (36.7 C)  97.7 F (36.5 C) 98 F (36.7 C)  TempSrc: Oral  Oral Oral  SpO2: 96%  98% 96%  Weight:      Height:        Intake/Output Summary (Last 24 hours) at 05/06/2020 2157 Last data filed at 05/06/2020 1714 Gross per 24 hour  Intake 944.7 ml  Output 500 ml  Net 444.7 ml   Filed Weights   05/04/20 1620 05/04/20 2201  Weight: 66.7 kg 67.7 kg    Examination:  General exam: Appears calm and comfortable  Respiratory system: Clear to auscultation. Respiratory effort normal. Cardiovascular system: Bradycardia. No JVD, murmurs, rubs, gallops or clicks. No pedal edema. Gastrointestinal system: Abdomen is nondistended, soft and nontender. No organomegaly or masses felt. Normal bowel sounds heard. Central nervous system: Alert and oriented. No focal neurological deficits. Extremities: Symmetric 5 x 5 power. Skin: No rashes, lesions or ulcers Psychiatry: Judgement and insight appear normal. Mood & affect appropriate.     Data Reviewed: I have personally reviewed following labs and imaging studies  CBC: Recent Labs  Lab 05/04/20 1214 05/05/20 0454 05/06/20 0522  WBC 4.8 5.2 4.7  NEUTROABS 2.8  --   --   HGB 13.6 10.9* 11.1*  HCT 38.9* 32.0* 32.8*  MCV 96.8 98.2 100.0  PLT 180 140* 144*    Basic Metabolic Panel: Recent Labs  Lab 05/04/20 1214 05/05/20 0454 05/06/20 0522  NA 140 140 143  K 2.7* 2.6* 3.0*  CL 105 107 113*  CO2 24 24 21*  GLUCOSE 97 91 90  BUN 21 12 9   CREATININE 1.38* 1.18 1.11  CALCIUM 8.7* 8.1* 8.2*  MG 2.1  --  1.7  PHOS  --   --  2.0*    GFR: Estimated Creatinine Clearance: 44 mL/min (by C-G formula based on SCr of 1.11 mg/dL).  Liver Function Tests: Recent Labs  Lab 05/04/20 1214 05/05/20 0454 05/06/20 0522  AST 52* 36 44*  ALT 49* 42 44  ALKPHOS 115 92 84  BILITOT 1.8* 1.5* 1.1  PROT 6.5 5.2* 5.0*  ALBUMIN 3.8 3.1* 2.9*    CBG: Recent Labs  Lab 05/04/20 1212  GLUCAP 92     Recent Results  (from the past 240 hour(s))  Respiratory Panel by RT PCR (Flu A&B, Covid) - Nasopharyngeal Swab     Status: None   Collection Time: 05/04/20  1:51 PM   Specimen: Nasopharyngeal Swab  Result Value Ref Range Status   SARS Coronavirus 2 by RT PCR NEGATIVE NEGATIVE Final    Comment: (NOTE) SARS-CoV-2 target nucleic acids are NOT DETECTED.  The SARS-CoV-2 RNA is generally detectable in upper respiratoy specimens during the acute phase of infection. The lowest concentration of SARS-CoV-2 viral copies this assay can detect is 131 copies/mL. A negative result does not preclude SARS-Cov-2 infection and should not be used as the sole basis for treatment or other patient management decisions. A negative result may occur with  improper specimen collection/handling, submission  of specimen other than nasopharyngeal swab, presence of viral mutation(s) within the areas targeted by this assay, and inadequate number of viral copies (<131 copies/mL). A negative result must be combined with clinical observations, patient history, and epidemiological information. The expected result is Negative.  Fact Sheet for Patients:  PinkCheek.be  Fact Sheet for Healthcare Providers:  GravelBags.it  This test is no t yet approved or cleared by the [place] FDA and  has been authorized for detection and/or diagnosis of SARS-CoV-2 by FDA under an Emergency Use Authorization (EUA). This EUA will remain  in effect (meaning this test can be used) for the duration of the COVID-19 declaration under Section 564(b)(1) of the Act, 21 U.S.C. section 360bbb-3(b)(1), unless the authorization is terminated or revoked sooner.     Influenza A by PCR NEGATIVE NEGATIVE Final   Influenza B by PCR NEGATIVE NEGATIVE Final    Comment: (NOTE) The Xpert Xpress SARS-CoV-2/FLU/RSV assay is intended as an aid in  the diagnosis of influenza from Nasopharyngeal swab specimens  and  should not be used as a sole basis for treatment. Nasal washings and  aspirates are unacceptable for Xpert Xpress SARS-CoV-2/FLU/RSV  testing.  Fact Sheet for Patients: PinkCheek.be  Fact Sheet for Healthcare Providers: GravelBags.it  This test is not yet approved or cleared by the [place] FDA and  has been authorized for detection and/or diagnosis of SARS-CoV-2 by  FDA under an Emergency Use Authorization (EUA). This EUA will remain  in effect (meaning this test can be used) for the duration of the  Covid-19 declaration under Section 564(b)(1) of the Act, 21  U.S.C. section 360bbb-3(b)(1), unless the authorization is  terminated or revoked. Performed at [facility], [place] [address] [place], [place] [address]   Gastrointestinal Panel by PCR , Stool     Status: Abnormal   Collection Time: 05/05/20 10:07 AM   Specimen: Stool  Result Value Ref Range Status   Campylobacter species DETECTED (A) NOT DETECTED Final    Comment: RESULT CALLED TO, READ BACK BY AND VERIFIED WITH: JENNIFER PAFFT 05/06/20 1426 KLW    Plesimonas shigelloides NOT DETECTED NOT DETECTED Final   Salmonella species NOT DETECTED NOT DETECTED Final   Yersinia enterocolitica NOT DETECTED NOT DETECTED Final   Vibrio species NOT DETECTED NOT DETECTED Final   Vibrio cholerae NOT DETECTED NOT DETECTED Final   Enteroaggregative E coli (EAEC) NOT DETECTED NOT DETECTED Final   Enteropathogenic E coli (EPEC) NOT DETECTED NOT DETECTED Final   Enterotoxigenic E coli (ETEC) NOT DETECTED NOT DETECTED Final   Shiga like toxin producing E coli (STEC) NOT DETECTED NOT DETECTED Final   Shigella/Enteroinvasive E coli (EIEC) NOT DETECTED NOT DETECTED Final   Cryptosporidium NOT DETECTED NOT DETECTED Final   Cyclospora cayetanensis NOT DETECTED NOT DETECTED Final   Entamoeba histolytica NOT DETECTED NOT DETECTED Final   Giardia lamblia NOT  DETECTED NOT DETECTED Final   Adenovirus F40/41 NOT DETECTED NOT DETECTED Final   Astrovirus NOT DETECTED NOT DETECTED Final   Norovirus GI/GII NOT DETECTED NOT DETECTED Final   Rotavirus A NOT DETECTED NOT DETECTED Final   Sapovirus (I, II, IV, and V) NOT DETECTED NOT DETECTED Final    Comment: Performed at [facility], [address] [place], [place] [address]  C Difficile Quick Screen w PCR reflex     Status: None   Collection Time: 05/05/20 10:07 AM   Specimen: Stool  Result Value Ref Range Status   C Diff antigen  NEGATIVE NEGATIVE Final   C Diff toxin NEGATIVE NEGATIVE Final   C Diff interpretation No C. difficile detected.  Final    Comment: Performed at [facility], [place] [address], [place], [place] [address]         Radiology Studies: MR 3D Recon At Scanner  Result Date: 05/06/2020 CLINICAL DATA:  Evaluate common bile duct dilatation. EXAM: MRI ABDOMEN WITHOUT AND WITH CONTRAST (INCLUDING MRCP) TECHNIQUE: Multiplanar multisequence MR imaging of the abdomen was performed both before and after the administration of intravenous contrast. Heavily T2-weighted images of the biliary and pancreatic ducts were obtained, and three-dimensional MRCP images were rendered by post processing. CONTRAST:  71mL GADAVIST GADOBUTROL 1 MMOL/ML IV SOLN COMPARISON:  CT AP 05/04/2020 FINDINGS: Lower chest: Bilateral pleural effusions identified. Hepatobiliary: On the arterial phase portion of the exam there is a small, hyperenhancing focus within the lateral right hepatic lobe. No corresponding signal abnormality on the precontrast T1 weighted images or the T2 weighted images. There is no washout associated with this structure on delayed imaging. Previous cholecystectomy. Mild intrahepatic bile duct dilatation. Fusiform common bile duct dilatation is again noted which measures up to 1.8 cm, image 13/6. No choledocholithiasis identified. Pancreas: No pancreatic inflammation,  main duct dilatation or mass identified. Spleen:  Within normal limits in size and appearance. Adrenals/Urinary Tract: Normal appearance of the adrenal glands. No kidney mass or hydronephrosis identified. Stomach/Bowel: Stomach is nondistended. Liquid stool is again noted within the colon compatible with a diarrheal state. No small bowel dilatation. Vascular/Lymphatic: No aneurysm identified. No signs of upper abdominal adenopathy. Other:  No free fluid or fluid collections within the abdomen. Musculoskeletal: No suspicious bone lesions identified. IMPRESSION: 1. Previous cholecystectomy. Fusiform common bile duct dilatation which measures up to 1.8 cm. No choledocholithiasis identified. In the setting of a normal bilirubin findings are favored to represent sequelae of post cholecystectomy physiology. 2. Small, hyperenhancing focus within the lateral right hepatic lobe is identified on the arterial phase portion of the exam. In the absence of risk factors for hepatoma this is favored to represent a benign abnormality. Recommend follow-up liver protocol CT or MRI in 6 months to ensure stability. 3. Liquid stool within the colon compatible with a diarrheal state. 4. Bilateral pleural effusions. Electronically Signed   By: Kerby Moors M.D.   On: 05/06/2020 10:09   MR ABDOMEN MRCP W WO CONTAST  Result Date: 05/06/2020 CLINICAL DATA:  Evaluate common bile duct dilatation. EXAM: MRI ABDOMEN WITHOUT AND WITH CONTRAST (INCLUDING MRCP) TECHNIQUE: Multiplanar multisequence MR imaging of the abdomen was performed both before and after the administration of intravenous contrast. Heavily T2-weighted images of the biliary and pancreatic ducts were obtained, and three-dimensional MRCP images were rendered by post processing. CONTRAST:  95mL GADAVIST GADOBUTROL 1 MMOL/ML IV SOLN COMPARISON:  CT AP 05/04/2020 FINDINGS: Lower chest: Bilateral pleural effusions identified. Hepatobiliary: On the arterial phase portion of the  exam there is a small, hyperenhancing focus within the lateral right hepatic lobe. No corresponding signal abnormality on the precontrast T1 weighted images or the T2 weighted images. There is no washout associated with this structure on delayed imaging. Previous cholecystectomy. Mild intrahepatic bile duct dilatation. Fusiform common bile duct dilatation is again noted which measures up to 1.8 cm, image 13/6. No choledocholithiasis identified. Pancreas: No pancreatic inflammation, main duct dilatation or mass identified. Spleen:  Within normal limits in size and appearance. Adrenals/Urinary Tract: Normal appearance of the adrenal glands. No kidney mass or hydronephrosis identified. Stomach/Bowel:  Stomach is nondistended. Liquid stool is again noted within the colon compatible with a diarrheal state. No small bowel dilatation. Vascular/Lymphatic: No aneurysm identified. No signs of upper abdominal adenopathy. Other:  No free fluid or fluid collections within the abdomen. Musculoskeletal: No suspicious bone lesions identified. IMPRESSION: 1. Previous cholecystectomy. Fusiform common bile duct dilatation which measures up to 1.8 cm. No choledocholithiasis identified. In the setting of a normal bilirubin findings are favored to represent sequelae of post cholecystectomy physiology. 2. Small, hyperenhancing focus within the lateral right hepatic lobe is identified on the arterial phase portion of the exam. In the absence of risk factors for hepatoma this is favored to represent a benign abnormality. Recommend follow-up liver protocol CT or MRI in 6 months to ensure stability. 3. Liquid stool within the colon compatible with a diarrheal state. 4. Bilateral pleural effusions. Electronically Signed   By: Kerby Moors M.D.   On: 05/06/2020 10:09        Scheduled Meds: . (feeding supplement) PROSource Plus  30 mL Oral Daily  . amiodarone  300 mg Oral Daily  . amLODipine  10 mg Oral Daily  . aspirin EC  81 mg  Oral Daily  . azithromycin  500 mg Oral Daily  . enoxaparin (LOVENOX) injection  40 mg Subcutaneous Q24H  . feeding supplement  1 Container Oral BID BM  . feeding supplement (KATE FARMS STANDARD 1.4)  325 mL Oral Daily  . multivitamin with minerals  1 tablet Oral Daily  . omega-3 acid ethyl esters  1 capsule Oral Daily   Continuous Infusions:    LOS: 2 days    Time spent: 40 minutes    Irine Seal, MD [facility]   To contact the attending provider between 7A-7P or the covering provider during after hours 7P-7A, please log into the web site [email or web address] and access using universal [place] password for that web site. If you do not have the password, please call the hospital operator.  05/06/2020, 9:57 PM

## 2020-05-07 DIAGNOSIS — E876 Hypokalemia: Secondary | ICD-10-CM | POA: Diagnosis not present

## 2020-05-07 DIAGNOSIS — R112 Nausea with vomiting, unspecified: Secondary | ICD-10-CM | POA: Diagnosis not present

## 2020-05-07 DIAGNOSIS — A045 Campylobacter enteritis: Principal | ICD-10-CM

## 2020-05-07 DIAGNOSIS — R197 Diarrhea, unspecified: Secondary | ICD-10-CM | POA: Diagnosis not present

## 2020-05-07 LAB — COMPREHENSIVE METABOLIC PANEL
ALT: 40 U/L (ref 0–44)
AST: 31 U/L (ref 15–41)
Albumin: 3 g/dL — ABNORMAL LOW (ref 3.5–5.0)
Alkaline Phosphatase: 92 U/L (ref 38–126)
Anion gap: 7 (ref 5–15)
BUN: 10 mg/dL (ref 8–23)
CO2: 24 mmol/L (ref 22–32)
Calcium: 8 mg/dL — ABNORMAL LOW (ref 8.9–10.3)
Chloride: 110 mmol/L (ref 98–111)
Creatinine, Ser: 1.21 mg/dL (ref 0.61–1.24)
GFR, Estimated: 53 mL/min — ABNORMAL LOW (ref >60)
Glucose, Bld: 96 mg/dL (ref 70–99)
Potassium: 3.7 mmol/L (ref 3.5–5.1)
Sodium: 141 mmol/L (ref 135–145)
Total Bilirubin: 0.9 mg/dL (ref 0.3–1.2)
Total Protein: 5.2 g/dL — ABNORMAL LOW (ref 6.5–8.1)

## 2020-05-07 LAB — CBC WITH DIFFERENTIAL/PLATELET
Abs Immature Granulocytes: 0.02 10*3/uL (ref 0.00–0.07)
Basophils Absolute: 0 10*3/uL (ref 0.0–0.1)
Basophils Relative: 0 %
Eosinophils Absolute: 0.4 10*3/uL (ref 0.0–0.5)
Eosinophils Relative: 8 %
HCT: 33.8 % — ABNORMAL LOW (ref 39.0–52.0)
Hemoglobin: 11.3 g/dL — ABNORMAL LOW (ref 13.0–17.0)
Immature Granulocytes: 0 %
Lymphocytes Relative: 23 %
Lymphs Abs: 1.2 10*3/uL (ref 0.7–4.0)
MCH: 33.1 pg (ref 26.0–34.0)
MCHC: 33.4 g/dL (ref 30.0–36.0)
MCV: 99.1 fL (ref 80.0–100.0)
Monocytes Absolute: 0.4 10*3/uL (ref 0.1–1.0)
Monocytes Relative: 8 %
Neutro Abs: 3.1 10*3/uL (ref 1.7–7.7)
Neutrophils Relative %: 61 %
Platelets: 160 10*3/uL (ref 150–400)
RBC: 3.41 MIL/uL — ABNORMAL LOW (ref 4.22–5.81)
RDW: 14 % (ref 11.5–15.5)
WBC: 5.1 10*3/uL (ref 4.0–10.5)
nRBC: 0 % (ref 0.0–0.2)

## 2020-05-07 LAB — MAGNESIUM: Magnesium: 2.3 mg/dL (ref 1.7–2.4)

## 2020-05-07 LAB — PHOSPHORUS: Phosphorus: 2.7 mg/dL (ref 2.5–4.6)

## 2020-05-07 MED ORDER — TAMSULOSIN HCL 0.4 MG PO CAPS: 0.4000 mg | ORAL_CAPSULE | ORAL | Status: DC

## 2020-05-07 MED ORDER — VITAMIN B-12 1000 MCG PO TABS
1000.0000 ug | ORAL_TABLET | Freq: Every day | ORAL | Status: DC
  Administered 2020-05-07 – 2020-05-08 (×2): 1000 ug via ORAL
  Filled 2020-05-07 (×2): qty 1

## 2020-05-07 MED ORDER — TAMSULOSIN HCL 0.4 MG PO CAPS
0.4000 mg | ORAL_CAPSULE | Freq: Every day | ORAL | Status: DC
  Administered 2020-05-07 – 2020-05-08 (×2): 0.4 mg via ORAL
  Filled 2020-05-07 (×2): qty 1

## 2020-05-07 MED ORDER — POTASSIUM CHLORIDE CRYS ER 20 MEQ PO TBCR
20.0000 meq | EXTENDED_RELEASE_TABLET | Freq: Once | ORAL | Status: AC
  Administered 2020-05-07: 20 meq via ORAL
  Filled 2020-05-07: qty 1

## 2020-05-07 MED ORDER — VITAMIN D 25 MCG (1000 UNIT) PO TABS
1000.0000 [IU] | ORAL_TABLET | Freq: Every day | ORAL | Status: DC
  Administered 2020-05-07 – 2020-05-08 (×2): 1000 [IU] via ORAL
  Filled 2020-05-07 (×2): qty 1

## 2020-05-07 NOTE — Care Management Important Message (Signed)
Important Message  Patient Details IM Letter given to the Patient Name: Thomas Doyle MRN: [id number] Date of Birth: [birth year]   Medicare Important Message Given:  Yes     Kerin Salen 05/07/2020, 10:47 AM

## 2020-05-07 NOTE — Plan of Care (Signed)

## 2020-05-07 NOTE — Progress Notes (Signed)
[facility] Progress Note  Thomas Doyle 86 y.o. Apr 10, 1929  CC:  Nausea/vomiting/diarrhea  Subjective: Patient reports feeling better.  States he has only had 2 stools in the last 24 hours, and none thus far today.  Stools yesterday were still liquid, but this is much improved from his 10-20 bowel movements per day.  Had a small amount of nausea this morning but states nausea has drastically improved.  No vomiting and is tolerating a soft diet.  ROS : Review of Systems  Cardiovascular: Negative for chest pain and palpitations.  Gastrointestinal: Positive for diarrhea and nausea. Negative for abdominal pain, blood in stool, constipation, heartburn, melena and vomiting.   Objective: Vital signs in last 24 hours: Vitals:   05/06/20 2018 05/07/20 0513  BP: (!) 127/51 (!) 104/54  Pulse: (!) 53 (!) 46  Resp: 18 16  Temp: 98 F (36.7 C) 97.8 F (36.6 C)  SpO2: 96% 95%    Physical Exam:  General:  Lethargic, elderly, oriented, cooperative, no acute distress  Head:  Normocephalic, without obvious abnormality, atraumatic  Eyes:  Anicteric sclera, EOMs intact  Lungs:   Clear to auscultation bilaterally, respirations unlabored  Heart:  Mildly bradycardic with regular rhythm, S1, S2 normal  Abdomen:   Soft, non-tender, non-distended, bowel sounds active all four quadrants,  no guarding or peritoneal signs  Extremities: Extremities normal, atraumatic, no  edema  Pulses: 2+ and symmetric    Lab Results: Recent Labs    05/06/20 0522 05/07/20 0538  NA 143 141  K 3.0* 3.7  CL 113* 110  CO2 21* 24  GLUCOSE 90 96  BUN 9 10  CREATININE 1.11 1.21  CALCIUM 8.2* 8.0*  MG 1.7 2.3  PHOS 2.0* 2.7   Recent Labs    05/06/20 0522 05/07/20 0538  AST 44* 31  ALT 44 40  ALKPHOS 84 92  BILITOT 1.1 0.9  PROT 5.0* 5.2*  ALBUMIN 2.9* 3.0*   Recent Labs    05/04/20 1214 05/05/20 0454 05/06/20 0522 05/07/20 0538  WBC 4.8   < > 4.7 5.1  NEUTROABS 2.8  --   --  3.1  HGB  13.6   < > 11.1* 11.3*  HCT 38.9*   < > 32.8* 33.8*  MCV 96.8   < > 100.0 99.1  PLT 180   < > 144* 160   < > = values in this interval not displayed.   No results for input(s): LABPROT, INR in the last 72 hours.    Assessment/Plan: Nausea/vomiting/diarrhea due to Campylobacter infection, improving -CT 10/11: liquid stool in the colon, no sign of diverticulitis -WBCs remain normal (5.1) -BUN 10/Cr 1.21  Abnormal CT: Intra and extrahepatic biliary ductal dilatation, with the common duct measuring up to 2 cm in diameter. No visible obstructing stone or mass.  History of remote cholecystectomy.  No opioid use. -Today, mildly elevated AST (44) otherwise normal LFTs  -Normal WBCs (4.7) -MRI MRCP 05/06/20 showed CBD dilation up to 18 mm without any obstructive finding, most likely postcholecystectomy related.  Also showed small hyperenhancing lesion in the right hepatic lobe.  Repeat CT or MRI recommended in 6 months.  Hypokalemia: presumably from diarrhea, resolved.  Potassium 3.7 today  Plan: Continue azithromycin 500 mg qd for a total of 5 days.  Continue supportive care.  Repeat MRI in 6 months due to hyperenhancing lesion in right hepatic lobe.  [facility] will sign off. Thank you for the consultation. Please let us know if we can be  of any further assistance during this hospital stay.  Salley Slaughter PA-C 05/07/2020, 9:47 AM  Contact #  [phone]

## 2020-05-07 NOTE — Progress Notes (Signed)
PROGRESS NOTE    Thomas SAKUMA  Doyle:[id number] DOB: 03-20-1929 DOA: 05/04/2020 PCP: Leeroy Cha, MD   Chief Complaint  Patient presents with  . Emesis  . Diarrhea    Brief Narrative:  Thomas Glogowski Smithis a 86 y.o.malewith medical history significant ofHTN, Large B-cell lymphoma. Presents with intractable N/V/D. He has had persistent diarrhea for over a month. OTC medications did not help. Changing diet did not help.2 weeks ago after speaking with his PCP, it was recommended that he go to Urgent Care.He was seen there and receivedunspecified meds that didn't help. He was able to get in with hisPCP last week and was given probiotics.His symptoms seem to have gotten worse.He was weak and lethargic yesterday.His daughter reports that he wasn't coherent.They sawhisPCPagaintoday, and he was told tocome to ED. At this point, he can not keep food or liquids down. Diarrhea remains persistent.   Assessment & Plan:   Principal Problem:   Campylobacter gastroenteritis Active Problems:   Intractable nausea and vomiting   Dehydration   Diarrhea   Hypokalemia   Hypomagnesemia   Hypophosphatemia   Atrial fibrillation, chronic (HCC)   Restless leg syndrome  1 nausea/ vomiting/ diarrhea secondary to campylobacter infection  as noted on GI pathogen panel.  C. difficile PCR was negative.  Patient improving clinically with nausea, no further emesis, decreasing number of loose stools.  CT abdomen and pelvis done on admission with liquid stool in the colon, no diverticulitis, previous cholecystectomy, intra and extra hepatic biliary ductal dilatation with no visible obstructing stone or mass.  MRI abdomen which was done with common bile duct dilatation of 18 mm without any obstructive findings consistent with postcholecystectomy, small hyperenhancing lesion in the right hepatic lobe.  Patient seen by GI, EGD offered but patient declined and patient wanted to be tried on soft  food and to advance to regular diet if able to.  Patient initially requested cholestyramine which was ordered however subsequently discontinued as GI pathogen panel came back positive for campylobacter infection.  Patient started on azithromycin which she is tolerating and will treat for 5 to 7 days.  Check a EKG to follow QTC.  Supportive care. Follow.  2.  Hypokalemia/hypomagnesemia/hypophosphatemia Secondary to GI losses.  Potassium at 3.7, phosphorus at 2.7, magnesium at 2.3.  Follow.  3.  Atrial fibrillation  patient noted to have some bradycardia with heart rates in the 40s to the 50s on 05/06/2020 and currently asymptomatic.  Toprol-XL discontinued.  DC Norvasc.  Continue amiodarone however if continued bradycardia may need to decrease dose of amiodarone.  Will need outpatient follow-up with cardiology.  Follow.  4.  Hypertension Blood pressure borderline.  Toprol-XL discontinued yesterday due to bradycardia.  DC Norvasc.  Follow.   5.  Restless leg syndrome Continue Requip.  6.  Chronic kidney disease stage IIIa Renal function stable.    7.  Bradycardia Patient with asymptomatic bradycardia.  Toprol-XL was discontinued yesterday.  DC Norvasc.  Follow.      DVT prophylaxis: Lovenox Code Status: DNR Family Communication: Updated patient.  No family at bedside. Disposition:   Status is: Inpatient    Dispo: The patient is from: Home              Anticipated d/c is to: Home              Anticipated d/c date is: 1 to 2 days.              Patient currently with diarrhea, electrolyte  abnormalities, on antibiotics.  Not stable for discharge.        Consultants:   Gastroenterology: Dr. Alessandra Bevels 05/05/2020  Procedures:   CT abdomen and pelvis 05/04/2020  Chest x-ray 05/04/2020  MRI abdomen 05/06/2020  Antimicrobials:   Azithromycin 05/06/2020>>>>   Subjective: Patient sitting up in the chair.  States he is feeling much better.  States nausea has improved  significantly and has almost resolved.  Denies any further emesis.  No abdominal pain.  Still with loose stools but number of stools have decreased per patient.  Patient stated had 2 loose stools today.  Tolerated soft diet.   Objective: Vitals:   05/06/20 0913 05/06/20 1313 05/06/20 2018 05/07/20 0513  BP: 120/60 (!) 131/57 (!) 127/51 (!) 104/54  Pulse: (!) 58 (!) 59 (!) 53 (!) 46  Resp:  17 18 16   Temp:  97.7 F (36.5 C) 98 F (36.7 C) 97.8 F (36.6 C)  TempSrc:  Oral Oral Oral  SpO2:  98% 96% 95%  Weight:      Height:        Intake/Output Summary (Last 24 hours) at 05/07/2020 1159 Last data filed at 05/07/2020 0839 Gross per 24 hour  Intake 704.7 ml  Output 300 ml  Net 404.7 ml   Filed Weights   05/04/20 1620 05/04/20 2201  Weight: 66.7 kg 67.7 kg    Examination:  General exam: NAD Respiratory system: CTAB, no wheezes, no crackles, no rhonchi.  Normal respiratory effort.  Cardiovascular system: Bradycardia.  No JVD, no murmurs rubs or gallops.  No lower extremity edema.  Gastrointestinal system: Abdomen is nondistended, soft, nontender, positive bowel sounds.  No rebound.  No guarding. Central nervous system: Alert and oriented. No focal neurological deficits. Extremities: Symmetric 5 x 5 power. Skin: No rashes, lesions or ulcers Psychiatry: Judgement and insight appear normal. Mood & affect appropriate.     Data Reviewed: I have personally reviewed following labs and imaging studies  CBC: Recent Labs  Lab 05/04/20 1214 05/05/20 0454 05/06/20 0522 05/07/20 0538  WBC 4.8 5.2 4.7 5.1  NEUTROABS 2.8  --   --  3.1  HGB 13.6 10.9* 11.1* 11.3*  HCT 38.9* 32.0* 32.8* 33.8*  MCV 96.8 98.2 100.0 99.1  PLT 180 140* 144* 735    Basic Metabolic Panel: Recent Labs  Lab 05/04/20 1214 05/05/20 0454 05/06/20 0522 05/07/20 0538  NA 140 140 143 141  K 2.7* 2.6* 3.0* 3.7  CL 105 107 113* 110  CO2 24 24 21* 24  GLUCOSE 97 91 90 96  BUN 21 12 9 10   CREATININE  1.38* 1.18 1.11 1.21  CALCIUM 8.7* 8.1* 8.2* 8.0*  MG 2.1  --  1.7 2.3  PHOS  --   --  2.0* 2.7    GFR: Estimated Creatinine Clearance: 40.4 mL/min (by C-G formula based on SCr of 1.21 mg/dL).  Liver Function Tests: Recent Labs  Lab 05/04/20 1214 05/05/20 0454 05/06/20 0522 05/07/20 0538  AST 52* 36 44* 31  ALT 49* 42 44 40  ALKPHOS 115 92 84 92  BILITOT 1.8* 1.5* 1.1 0.9  PROT 6.5 5.2* 5.0* 5.2*  ALBUMIN 3.8 3.1* 2.9* 3.0*    CBG: Recent Labs  Lab 05/04/20 1212  GLUCAP 92     Recent Results (from the past 240 hour(s))  Respiratory Panel by RT PCR (Flu A&B, Covid) - Nasopharyngeal Swab     Status: None   Collection Time: 05/04/20  1:51 PM   Specimen: Nasopharyngeal Swab  Result Value Ref Range Status   SARS Coronavirus 2 by RT PCR NEGATIVE NEGATIVE Final    Comment: (NOTE) SARS-CoV-2 target nucleic acids are NOT DETECTED.  The SARS-CoV-2 RNA is generally detectable in upper respiratoy specimens during the acute phase of infection. The lowest concentration of SARS-CoV-2 viral copies this assay can detect is 131 copies/mL. A negative result does not preclude SARS-Cov-2 infection and should not be used as the sole basis for treatment or other patient management decisions. A negative result may occur with  improper specimen collection/handling, submission of specimen other than nasopharyngeal swab, presence of viral mutation(s) within the areas targeted by this assay, and inadequate number of viral copies (<131 copies/mL). A negative result must be combined with clinical observations, patient history, and epidemiological information. The expected result is Negative.  Fact Sheet for Patients:  PinkCheek.be  Fact Sheet for Healthcare Providers:  GravelBags.it  This test is no t yet approved or cleared by the [place] FDA and  has been authorized for detection and/or diagnosis of SARS-CoV-2 by FDA  under an Emergency Use Authorization (EUA). This EUA will remain  in effect (meaning this test can be used) for the duration of the COVID-19 declaration under Section 564(b)(1) of the Act, 21 U.S.C. section 360bbb-3(b)(1), unless the authorization is terminated or revoked sooner.     Influenza A by PCR NEGATIVE NEGATIVE Final   Influenza B by PCR NEGATIVE NEGATIVE Final    Comment: (NOTE) The Xpert Xpress SARS-CoV-2/FLU/RSV assay is intended as an aid in  the diagnosis of influenza from Nasopharyngeal swab specimens and  should not be used as a sole basis for treatment. Nasal washings and  aspirates are unacceptable for Xpert Xpress SARS-CoV-2/FLU/RSV  testing.  Fact Sheet for Patients: PinkCheek.be  Fact Sheet for Healthcare Providers: GravelBags.it  This test is not yet approved or cleared by the [place] FDA and  has been authorized for detection and/or diagnosis of SARS-CoV-2 by  FDA under an Emergency Use Authorization (EUA). This EUA will remain  in effect (meaning this test can be used) for the duration of the  Covid-19 declaration under Section 564(b)(1) of the Act, 21  U.S.C. section 360bbb-3(b)(1), unless the authorization is  terminated or revoked. Performed at [facility], [place] [address] [place], [place] [address]   Gastrointestinal Panel by PCR , Stool     Status: Abnormal   Collection Time: 05/05/20 10:07 AM   Specimen: Stool  Result Value Ref Range Status   Campylobacter species DETECTED (A) NOT DETECTED Final    Comment: RESULT CALLED TO, READ BACK BY AND VERIFIED WITH: JENNIFER PAFFT 05/06/20 1426 KLW    Plesimonas shigelloides NOT DETECTED NOT DETECTED Final   Salmonella species NOT DETECTED NOT DETECTED Final   Yersinia enterocolitica NOT DETECTED NOT DETECTED Final   Vibrio species NOT DETECTED NOT DETECTED Final   Vibrio cholerae NOT DETECTED NOT DETECTED Final    Enteroaggregative E coli (EAEC) NOT DETECTED NOT DETECTED Final   Enteropathogenic E coli (EPEC) NOT DETECTED NOT DETECTED Final   Enterotoxigenic E coli (ETEC) NOT DETECTED NOT DETECTED Final   Shiga like toxin producing E coli (STEC) NOT DETECTED NOT DETECTED Final   Shigella/Enteroinvasive E coli (EIEC) NOT DETECTED NOT DETECTED Final   Cryptosporidium NOT DETECTED NOT DETECTED Final   Cyclospora cayetanensis NOT DETECTED NOT DETECTED Final   Entamoeba histolytica NOT DETECTED NOT DETECTED Final   Giardia lamblia NOT DETECTED NOT DETECTED Final   Adenovirus F40/41 NOT DETECTED  NOT DETECTED Final   Astrovirus NOT DETECTED NOT DETECTED Final   Norovirus GI/GII NOT DETECTED NOT DETECTED Final   Rotavirus A NOT DETECTED NOT DETECTED Final   Sapovirus (I, II, IV, and V) NOT DETECTED NOT DETECTED Final    Comment: Performed at [facility], [address] [place], [place] [address]  C Difficile Quick Screen w PCR reflex     Status: None   Collection Time: 05/05/20 10:07 AM   Specimen: Stool  Result Value Ref Range Status   C Diff antigen NEGATIVE NEGATIVE Final   C Diff toxin NEGATIVE NEGATIVE Final   C Diff interpretation No C. difficile detected.  Final    Comment: Performed at [facility], [place] [address], [place], [place] [address]         Radiology Studies: MR 3D Recon At Scanner  Result Date: 05/06/2020 CLINICAL DATA:  Evaluate common bile duct dilatation. EXAM: MRI ABDOMEN WITHOUT AND WITH CONTRAST (INCLUDING MRCP) TECHNIQUE: Multiplanar multisequence MR imaging of the abdomen was performed both before and after the administration of intravenous contrast. Heavily T2-weighted images of the biliary and pancreatic ducts were obtained, and three-dimensional MRCP images were rendered by post processing. CONTRAST:  67mL GADAVIST GADOBUTROL 1 MMOL/ML IV SOLN COMPARISON:  CT AP 05/04/2020 FINDINGS: Lower chest: Bilateral pleural effusions identified.  Hepatobiliary: On the arterial phase portion of the exam there is a small, hyperenhancing focus within the lateral right hepatic lobe. No corresponding signal abnormality on the precontrast T1 weighted images or the T2 weighted images. There is no washout associated with this structure on delayed imaging. Previous cholecystectomy. Mild intrahepatic bile duct dilatation. Fusiform common bile duct dilatation is again noted which measures up to 1.8 cm, image 13/6. No choledocholithiasis identified. Pancreas: No pancreatic inflammation, main duct dilatation or mass identified. Spleen:  Within normal limits in size and appearance. Adrenals/Urinary Tract: Normal appearance of the adrenal glands. No kidney mass or hydronephrosis identified. Stomach/Bowel: Stomach is nondistended. Liquid stool is again noted within the colon compatible with a diarrheal state. No small bowel dilatation. Vascular/Lymphatic: No aneurysm identified. No signs of upper abdominal adenopathy. Other:  No free fluid or fluid collections within the abdomen. Musculoskeletal: No suspicious bone lesions identified. IMPRESSION: 1. Previous cholecystectomy. Fusiform common bile duct dilatation which measures up to 1.8 cm. No choledocholithiasis identified. In the setting of a normal bilirubin findings are favored to represent sequelae of post cholecystectomy physiology. 2. Small, hyperenhancing focus within the lateral right hepatic lobe is identified on the arterial phase portion of the exam. In the absence of risk factors for hepatoma this is favored to represent a benign abnormality. Recommend follow-up liver protocol CT or MRI in 6 months to ensure stability. 3. Liquid stool within the colon compatible with a diarrheal state. 4. Bilateral pleural effusions. Electronically Signed   By: Kerby Moors M.D.   On: 05/06/2020 10:09   MR ABDOMEN MRCP W WO CONTAST  Result Date: 05/06/2020 CLINICAL DATA:  Evaluate common bile duct dilatation. EXAM: MRI  ABDOMEN WITHOUT AND WITH CONTRAST (INCLUDING MRCP) TECHNIQUE: Multiplanar multisequence MR imaging of the abdomen was performed both before and after the administration of intravenous contrast. Heavily T2-weighted images of the biliary and pancreatic ducts were obtained, and three-dimensional MRCP images were rendered by post processing. CONTRAST:  64mL GADAVIST GADOBUTROL 1 MMOL/ML IV SOLN COMPARISON:  CT AP 05/04/2020 FINDINGS: Lower chest: Bilateral pleural effusions identified. Hepatobiliary: On the arterial phase portion of the exam there is a small,  hyperenhancing focus within the lateral right hepatic lobe. No corresponding signal abnormality on the precontrast T1 weighted images or the T2 weighted images. There is no washout associated with this structure on delayed imaging. Previous cholecystectomy. Mild intrahepatic bile duct dilatation. Fusiform common bile duct dilatation is again noted which measures up to 1.8 cm, image 13/6. No choledocholithiasis identified. Pancreas: No pancreatic inflammation, main duct dilatation or mass identified. Spleen:  Within normal limits in size and appearance. Adrenals/Urinary Tract: Normal appearance of the adrenal glands. No kidney mass or hydronephrosis identified. Stomach/Bowel: Stomach is nondistended. Liquid stool is again noted within the colon compatible with a diarrheal state. No small bowel dilatation. Vascular/Lymphatic: No aneurysm identified. No signs of upper abdominal adenopathy. Other:  No free fluid or fluid collections within the abdomen. Musculoskeletal: No suspicious bone lesions identified. IMPRESSION: 1. Previous cholecystectomy. Fusiform common bile duct dilatation which measures up to 1.8 cm. No choledocholithiasis identified. In the setting of a normal bilirubin findings are favored to represent sequelae of post cholecystectomy physiology. 2. Small, hyperenhancing focus within the lateral right hepatic lobe is identified on the arterial phase  portion of the exam. In the absence of risk factors for hepatoma this is favored to represent a benign abnormality. Recommend follow-up liver protocol CT or MRI in 6 months to ensure stability. 3. Liquid stool within the colon compatible with a diarrheal state. 4. Bilateral pleural effusions. Electronically Signed   By: Kerby Moors M.D.   On: 05/06/2020 10:09        Scheduled Meds: . (feeding supplement) PROSource Plus  30 mL Oral Daily  . amiodarone  300 mg Oral Daily  . aspirin EC  81 mg Oral Daily  . azithromycin  500 mg Oral Daily  . cholecalciferol  1,000 Units Oral Daily  . enoxaparin (LOVENOX) injection  40 mg Subcutaneous Q24H  . feeding supplement  1 Container Oral BID BM  . feeding supplement (KATE FARMS STANDARD 1.4)  325 mL Oral Daily  . multivitamin with minerals  1 tablet Oral Daily  . omega-3 acid ethyl esters  1 capsule Oral Daily  . tamsulosin  0.4 mg Oral Daily  . vitamin B-12  1,000 mcg Oral Daily   Continuous Infusions:    LOS: 3 days    Time spent: 35 minutes    Irine Seal, MD [facility]   To contact the attending provider between 7A-7P or the covering provider during after hours 7P-7A, please log into the web site [email or web address] and access using universal [place] password for that web site. If you do not have the password, please call the hospital operator.  05/07/2020, 11:59 AM

## 2020-05-07 NOTE — Progress Notes (Signed)
Spoke with IP regarding need for isoation related to GI panel results. Per IP if patient does not have rectal tube and is not incontinent of stool then isolation precautions can be d/c.

## 2020-05-08 DIAGNOSIS — E876 Hypokalemia: Secondary | ICD-10-CM | POA: Diagnosis not present

## 2020-05-08 DIAGNOSIS — A045 Campylobacter enteritis: Secondary | ICD-10-CM | POA: Diagnosis not present

## 2020-05-08 DIAGNOSIS — R1114 Bilious vomiting: Secondary | ICD-10-CM | POA: Diagnosis not present

## 2020-05-08 DIAGNOSIS — I482 Chronic atrial fibrillation, unspecified: Secondary | ICD-10-CM | POA: Diagnosis not present

## 2020-05-08 LAB — CBC WITH DIFFERENTIAL/PLATELET
Abs Immature Granulocytes: 0.01 10*3/uL (ref 0.00–0.07)
Basophils Absolute: 0 10*3/uL (ref 0.0–0.1)
Basophils Relative: 0 %
Eosinophils Absolute: 0.3 10*3/uL (ref 0.0–0.5)
Eosinophils Relative: 7 %
HCT: 30.2 % — ABNORMAL LOW (ref 39.0–52.0)
Hemoglobin: 10.4 g/dL — ABNORMAL LOW (ref 13.0–17.0)
Immature Granulocytes: 0 %
Lymphocytes Relative: 25 %
Lymphs Abs: 1.2 10*3/uL (ref 0.7–4.0)
MCH: 33.9 pg (ref 26.0–34.0)
MCHC: 34.4 g/dL (ref 30.0–36.0)
MCV: 98.4 fL (ref 80.0–100.0)
Monocytes Absolute: 0.5 10*3/uL (ref 0.1–1.0)
Monocytes Relative: 10 %
Neutro Abs: 2.8 10*3/uL (ref 1.7–7.7)
Neutrophils Relative %: 58 %
Platelets: 137 10*3/uL — ABNORMAL LOW (ref 150–400)
RBC: 3.07 MIL/uL — ABNORMAL LOW (ref 4.22–5.81)
RDW: 13.9 % (ref 11.5–15.5)
WBC: 4.8 10*3/uL (ref 4.0–10.5)
nRBC: 0 % (ref 0.0–0.2)

## 2020-05-08 LAB — RENAL FUNCTION PANEL
Albumin: 2.8 g/dL — ABNORMAL LOW (ref 3.5–5.0)
Anion gap: 8 (ref 5–15)
BUN: 13 mg/dL (ref 8–23)
CO2: 23 mmol/L (ref 22–32)
Calcium: 7.9 mg/dL — ABNORMAL LOW (ref 8.9–10.3)
Chloride: 107 mmol/L (ref 98–111)
Creatinine, Ser: 1 mg/dL (ref 0.61–1.24)
GFR, Estimated: >60 mL/min (ref >60)
Glucose, Bld: 89 mg/dL (ref 70–99)
Phosphorus: 3 mg/dL (ref 2.5–4.6)
Potassium: 3.4 mmol/L — ABNORMAL LOW (ref 3.5–5.1)
Sodium: 138 mmol/L (ref 135–145)

## 2020-05-08 LAB — MAGNESIUM: Magnesium: 1.9 mg/dL (ref 1.7–2.4)

## 2020-05-08 MED ORDER — POTASSIUM CHLORIDE CRYS ER 20 MEQ PO TBCR
40.0000 meq | EXTENDED_RELEASE_TABLET | Freq: Once | ORAL | Status: AC
  Administered 2020-05-08: 40 meq via ORAL
  Filled 2020-05-08: qty 2

## 2020-05-08 MED ORDER — AZITHROMYCIN 500 MG PO TABS: 500.0000 mg | ORAL_TABLET | Freq: Every day | ORAL | 0 refills | Status: AC

## 2020-05-08 NOTE — Evaluation (Signed)
Occupational Therapy Evaluation Patient Details Name: Thomas Doyle MRN: [id number] DOB: 1928/09/13 Today's Date: 05/08/2020    History of Present Illness 86 y.o. male with medical history significant of HTN, Large B-cell lymphoma. Presents with intractable N/V/D secondary to campylobacter infection as noted on GI pathogen panel   Clinical Impression   Patient evaluated by Occupational Therapy with no further acute OT needs identified. All education has been completed and the patient has no further questions.  See below for any follow-up Occupational Therapy or equipment needs. OT is signing off. Thank you for this referral.  ?   Follow Up Recommendations  No OT follow up    Equipment Recommendations  Tub/shower seat    Recommendations for Other Services       Precautions / Restrictions Precautions Precautions: Fall Precaution Comments: reports ~ 12 falls in past year with no injuries resulting Restrictions Weight Bearing Restrictions: No      Mobility Bed Mobility Overal bed mobility: Modified Independent             General bed mobility comments: Supine to sit with HOB partially rasied Mod I.  Transfers Overall transfer level: Modified independent Equipment used: Rolling walker (2 wheeled) Transfers: Sit to/from [facility] Sit to Stand: Modified independent (Device/Increase time) Stand pivot transfers: Independent       General transfer comment: Pt ambulated in room and stood at sink for grooming with RW. Pt demonstrated stand/pivot without AD Independently.    Balance Overall balance assessment: History of Falls   Sitting balance-Leahy Scale: Normal       Standing balance-Leahy Scale: Good               High level balance activites: Side stepping;Backward walking;Direction changes;Sudden stops;Head turns High Level Balance Comments: slight drifitng however no overt LOB with challenges above           ADL either  performed or assessed with clinical judgement   ADL Overall ADL's : Modified independent                                       General ADL Comments: Pt is demonstrating BADLs at Indepedent to Mod I level.  Did recommend a shower chair and long bath brush to increase safety with showers and conserve energy. Recommended supervision with showers. Pt agreeable.     Vision Baseline Vision/History: No visual deficits Vision Assessment?: No apparent visual deficits     Perception     Praxis      Pertinent Vitals/Pain Pain Assessment: No/denies pain     Hand Dominance Right   Extremity/Trunk Assessment Upper Extremity Assessment Upper Extremity Assessment: Overall WFL for tasks assessed   Lower Extremity Assessment Lower Extremity Assessment: Overall WFL for tasks assessed   Cervical / Trunk Assessment Cervical / Trunk Assessment: Normal   Communication Communication Communication: No difficulties   Cognition Arousal/Alertness: Awake/alert Behavior During Therapy: WFL for tasks assessed/performed Overall Cognitive Status: Within Functional Limits for tasks assessed                                 General Comments: A&Ox4, very pleasant   General Comments       Exercises     Shoulder Instructions      Home Living Family/patient expects to be discharged to:: Private residence Living Arrangements: Children Available  Help at Discharge: Family;Available 24 hours/day Type of Home: House Home Access: Stairs to enter [employer] of Steps: 6 Entrance Stairs-Rails: Can reach both Home Layout: Two level Alternate Level Stairs-Number of Steps: 12-14 Alternate Level Stairs-Rails: Left Bathroom Shower/Tub: Tub/shower unit   Bathroom Toilet: Handicapped height     Home Equipment: Environmental consultant - 2 wheels;Cane - single point;Wheelchair - manual   Additional Comments: Wheelchair is a Systems analyst.      Prior Functioning/Environment  Level of Independence: Independent;Independent with assistive device(s)        Comments: Pt reports that he uses his SPC at all times out of the home, and as needed inside.  He does like to access his basement as that is where his exercise equipment is stored.        OT Problem List:        OT Treatment/Interventions:      OT Goals(Current goals can be found in the care plan section) Acute Rehab OT Goals Patient Stated Goal: home!! OT Goal Formulation: With patient Potential to Achieve Goals: Good  OT Frequency:     Barriers to D/C:            Co-evaluation              AM-PAC OT "6 Clicks" Daily Activity     Outcome Measure Help from another person eating meals?: None Help from another person taking care of personal grooming?: None Help from another person toileting, which includes using toliet, bedpan, or urinal?: None Help from another person bathing (including washing, rinsing, drying)?: A Little Help from another person to put on and taking off regular upper body clothing?: None Help from another person to put on and taking off regular lower body clothing?: None 6 Click Score: 23   End of Session Equipment Utilized During Treatment: Gait belt;Rolling walker Nurse Communication: Mobility status  Activity Tolerance: Patient tolerated treatment well Patient left: in chair;with call bell/phone within reach;with chair alarm set  OT Visit Diagnosis: History of falling (Z91.81);Repeated falls (R29.6)                Time: 6712-4580 OT Time Calculation (min): 31 min Charges:  OT General Charges $OT Visit: 1 Visit OT Evaluation $OT Eval Low Complexity: 1 Low  Thresa Dozier, OT Acute Rehab Services Office: [phone] 05/08/2020 Thomas Doyle 05/08/2020, 12:16 PM

## 2020-05-08 NOTE — Evaluation (Signed)
Physical Therapy Evaluation Patient Details Name: Thomas Doyle MRN: [id number] DOB: [birth year] Today's Date: 05/08/2020   History of Present Illness  86 y.o. male with medical history significant of HTN, Large B-cell lymphoma. Presents with intractable N/V/D secondary to campylobacter infection as noted on GI pathogen panel  Clinical Impression  Patient evaluated by Physical Therapy with no further acute PT needs identified. All education has been completed and the patient has no further questions.  Pt is independent at his baseline. Has DME if needed. Pt amb without device, no LOB with higher level balance challenges.  See below for any follow-up Physical Therapy or equipment needs. PT is signing off. Thank you for this referral.     Follow Up Recommendations No PT follow up    Equipment Recommendations  None recommended by PT    Recommendations for Other Services       Precautions / Restrictions Precautions Precautions: Fall Precaution Comments: reports ~ 12 falls in past year with no injuries resulting Restrictions Weight Bearing Restrictions: No      Mobility  Bed Mobility               General bed mobility comments: in chair  Transfers Overall transfer level: Needs assistance Equipment used: Rolling walker (2 wheeled) Transfers: Sit to/from Stand Sit to Stand: Modified independent (Device/Increase time);Independent         General transfer comment: no physical assist, able to stand without use of hands  Ambulation/Gait Ambulation/Gait assistance: Modified independent (Device/Increase time);Independent Gait Distance (Feet): 220 Feet   Gait Pattern/deviations: Step-through pattern   Gait velocity interpretation: >2.62 ft/sec, indicative of community ambulatory    Stairs            Wheelchair Mobility    Modified Rankin (Stroke Patients Only)       Balance Overall balance assessment: History of Falls   Sitting balance-Leahy Scale:  Normal       Standing balance-Leahy Scale: Good               High level balance activites: Side stepping;Backward walking;Direction changes;Sudden stops;Head turns High Level Balance Comments: slight drifitng however no overt LOB with challenges above             Pertinent Vitals/Pain Pain Assessment: No/denies pain    Home Living Family/patient expects to be discharged to:: Private residence Living Arrangements: Children Available Help at Discharge: Family;Available 24 hours/day Type of Home: House Home Access: Stairs to enter   [employer] of Steps: 6 Home Layout: Two level Home Equipment: Walker - 2 wheels;Cane - single point;Wheelchair - manual      Prior Function Level of Independence: Independent;Independent with assistive device(s)               Hand Dominance        Extremity/Trunk Assessment   Upper Extremity Assessment Upper Extremity Assessment: Defer to OT evaluation    Lower Extremity Assessment Lower Extremity Assessment: Overall WFL for tasks assessed       Communication   Communication: No difficulties  Cognition Arousal/Alertness: Awake/alert Behavior During Therapy: WFL for tasks assessed/performed Overall Cognitive Status: Within Functional Limits for tasks assessed                                        General Comments      Exercises     Assessment/Plan    PT Assessment Patent  does not need any further PT services  PT Problem List         PT Treatment Interventions      PT Goals (Current goals can be found in the Care Plan section)  Acute Rehab PT Goals Patient Stated Goal: home!! PT Goal Formulation: All assessment and education complete, DC therapy    Frequency     Barriers to discharge        Co-evaluation               AM-PAC PT "6 Clicks" Mobility  Outcome Measure Help needed turning from your back to your side while in a flat bed without using bedrails?:  None Help needed moving from lying on your back to sitting on the side of a flat bed without using bedrails?: None Help needed moving to and from a bed to a chair (including a wheelchair)?: None Help needed standing up from a chair using your arms (e.g., wheelchair or bedside chair)?: None Help needed to walk in hospital room?: None Help needed climbing 3-5 steps with a railing? : None 6 Click Score: 24    End of Session Equipment Utilized During Treatment: Gait belt Activity Tolerance: Patient tolerated treatment well Patient left: in chair;with call bell/phone within reach;with family/visitor present   PT Visit Diagnosis: Difficulty in walking, not elsewhere classified (R26.2)    Time: 9024-0973 PT Time Calculation (min) (ACUTE ONLY): 13 min   Charges:   PT Evaluation $PT Eval Low Complexity: [place], PT  Acute Rehab Dept ([facility]) [phone] Pager [phone]  05/08/2020   [facility] 05/08/2020, 11:15 AM

## 2020-05-08 NOTE — Discharge Summary (Signed)
Physician Discharge Summary  Thomas Doyle GXQ:[id number] DOB: Mar 20, 1929 DOA: 05/04/2020  PCP: Leeroy Cha, MD  Admit date: 05/04/2020 Discharge date: 05/08/2020  Time spent: 55 minutes  Recommendations for Outpatient Follow-up:  1. Follow-up with Leeroy Cha, MD in 2 weeks.  On follow-up patient will need a basic metabolic profile, magnesium, phosphorus level checked to follow-up on electrolytes and renal function.  Patient's blood pressure also need to be reassessed on follow-up as patient's Toprol-XL and Norvasc were discontinued during the hospitalization. 2. Follow-up with Dr. Alessandra Bevels, gastroenterology in 3 months.  3. Follow-up with Dr. Shelva Majestic cardiology in 2 to 3 weeks.  On follow-up patient bradycardia need to be reassessed as patient's Toprol-XL and Norvasc discontinued during the hospitalization.  Patient's A. fib also needs to be reassessed as patient maintained on home regimen of amiodarone.   Discharge Diagnoses:  Principal Problem:   Campylobacter gastroenteritis Active Problems:   Intractable nausea and vomiting   Dehydration   Diarrhea   Hypokalemia   Hypomagnesemia   Hypophosphatemia   Atrial fibrillation, chronic (HCC)   Restless leg syndrome   Discharge Condition: Stable and improved  Diet recommendation: Heart healthy  Filed Weights   05/04/20 1620 05/04/20 2201  Weight: 66.7 kg 67.7 kg    History of present illness:  HPI per Dr Elmer Bales is a 86 y.o. male with medical history significant of HTN, Large B-cell lymphoma. Presented with intractable N/V/D. He has had persistent diarrhea for over a month. OTC medications did not help. Changing diet did not help. 2 weeks ago after speaking with his PCP, it was recommended that he go to Urgent Care. He was seen there and received unspecified meds that didn't help. He was able to get in with his PCP last week and was given probiotics. His symptoms seem to have gotten  worse. He was weak and lethargic yesterday. His daughter reports that he wasn't coherent. They saw his PCP again today, and he was told to come to ED. At this point, he can not keep food or liquids down. Diarrhea remains persistent w/ last episode an hour before interview.   ED Course: CT imaging revealed dilated common bile duct. Lab work showed mildly elevated liver enzymes. K+ was down to 2.7. TRH was called for admission.   Review of Systems:  He denies F, CP, dyspnea, palpitations. Review of systems is otherwise negative for all not mentioned in HPI.   Hospital Course:  1 nausea/ vomiting/ diarrhea secondary to campylobacter infection  as noted on GI pathogen panel.  C. difficile PCR was negative.   CT abdomen and pelvis done on admission with liquid stool in the colon, no diverticulitis, previous cholecystectomy, intra and extra hepatic biliary ductal dilatation with no visible obstructing stone or mass.  MRI abdomen which was done with common bile duct dilatation of 18 mm without any obstructive findings consistent with postcholecystectomy, small hyperenhancing lesion in the right hepatic lobe.  Patient seen by GI, EGD offered but patient declined and patient wanted to be tried on soft food and to advance to regular diet if able to.  Patient initially requested cholestyramine which was ordered however subsequently discontinued as GI pathogen panel came back positive for campylobacter infection.  Patient started on azithromycin and improved clinically.  Diet was advanced to a regular diet which he tolerated.  Number of stools improved.  Consistency of stools improved.  Patient had no further nausea or vomiting.  Patient improved clinically and be discharged in  stable and improved condition on 3 more days of azithromycin to complete a 5 to 6-day course of treatment.  Outpatient follow-up with PCP.  Outpatient follow-up with GI in 3 months.  Patient will need repeat CT or MRI of the abdomen to  follow-up on small hyperenhancing lesion in the right hepatic lobe.  Patient was discharged in stable and improved condition.  2.  Hypokalemia/hypomagnesemia/hypophosphatemia Secondary to GI losses.    Patient's electrolytes were repleted during the hospitalization.  Outpatient follow-up with PCP.  3.  Abnormal CT/abnormal MRI CT abdomen which was done showed intra and extra hepatic biliary ductal dilatation with common bile duct measuring up to 2 cm in diameter with no visible obstructing stone or mass.  Patient with remote history of cholecystectomy.  MRCP done showed CBD dilatation up to 18 mm without any obstructive finding most likely postcholecystectomy related.  Also did show a small hyperenhancing lesion in the right hepatic lobe.  Repeat CT or MRI recommended in 6 months.  Patient was followed by GI during the hospitalization will follow up with GI in the outpatient setting in 3 months.  4.  Atrial fibrillation  patient noted to have some bradycardia with heart rates in the 40s to the 50s on 05/06/2020 and remained asymptomatic.  Toprol-XL and Norvasc discontinued.  Patient maintained on home regimen of amiodarone.  Outpatient follow-up with cardiology.   5.  Hypertension Blood pressure borderline during the hospitalization and/patient's Toprol-XL and Norvasc discontinued due to borderline blood pressure as well as bradycardia.  Blood pressure remained stable.  Patient remained asymptomatic.  Outpatient follow-up with PCP and cardiology.  6.  Restless leg syndrome Patient maintained on home regimen Requip.  Outpatient follow-up.   7.  Chronic kidney disease stage IIIa Renal function remained stable.  Outpatient follow-up.   8.  Bradycardia Patient with asymptomatic bradycardia.  Toprol-XL and Norvasc discontinued.  Outpatient follow-up with cardiology.     Procedures:  CT abdomen and pelvis 05/04/2020  Chest x-ray 05/04/2020  MRI abdomen  05/06/2020  Consultations:  Gastroenterology: Dr. Alessandra Bevels 05/05/2020  Discharge Exam: Vitals:   05/08/20 0541 05/08/20 0935  BP: 106/63 (!) 114/103  Pulse: (!) 50 71  Resp: 18   Temp: 98.1 F (36.7 C)   SpO2: 93%     General: NAD Cardiovascular: Bradycardia Respiratory: CTAB  Discharge Instructions   Discharge Instructions    Diet - low sodium heart healthy   Complete by: As directed    Increase activity slowly   Complete by: As directed      Allergies as of 05/08/2020      Reactions   Codeine Nausea Only      Medication List    STOP taking these medications   amLODipine 10 MG tablet Commonly known as: NORVASC   metoprolol succinate 25 MG 24 hr tablet Commonly known as: TOPROL-XL     TAKE these medications   amiodarone 200 MG tablet Commonly known as: PACERONE TAKE ONE AND ONE-HALF (1 & 1/2) TABLET BY MOUTH DAILY What changed:   how much to take  how to take this  when to take this  additional instructions   aspirin 81 MG tablet Take 81 mg by mouth daily.   azithromycin 500 MG tablet Commonly known as: ZITHROMAX Take 1 tablet (500 mg total) by mouth daily for 3 days. Start taking on: May 09, 2020   cholecalciferol 25 MCG (1000 UNIT) tablet Commonly known as: VITAMIN D3 Take 1,000 Units by mouth daily.   omega-3  acid ethyl esters 1 g capsule Commonly known as: LOVAZA Take 1 capsule (1 g total) by mouth daily.   rOPINIRole 1 MG tablet Commonly known as: REQUIP Take 1 mg by mouth daily as needed (for restless legs).   tamsulosin 0.4 MG Caps capsule Commonly known as: FLOMAX Take 0.4 mg by mouth daily as needed (pt preference).   vitamin B-12 1000 MCG tablet Commonly known as: CYANOCOBALAMIN Take 1,000 mcg by mouth daily.   zolpidem 5 MG tablet Commonly known as: AMBIEN Take 1 tablet (5 mg total) by mouth at bedtime as needed. for sleep            Durable Medical Equipment  (From admission, onward)         Start      Ordered   05/08/20 1225  For home use only DME Shower stool  Once        05/08/20 1224         Allergies  Allergen Reactions  . Codeine Nausea Only    Follow-up Information    Brahmbhatt, Parag, MD. Schedule an appointment as soon as possible for a visit in 3 month(s).   Specialty: Gastroenterology Why: abnormal MRI Contact information: [address] [place] [address] [phone]        Leeroy Cha, MD. Schedule an appointment as soon as possible for a visit in 2 week(s).   Specialty: Internal Medicine Contact information: [address] [place] [address] [phone]        Troy Sine, MD. Schedule an appointment as soon as possible for a visit in 2 week(s).   Specialty: Cardiology Why: f/u in 2-3 weeks. Contact information: [address] [place] [address] [phone]                The results of significant diagnostics from this hospitalization (including imaging, microbiology, ancillary and laboratory) are listed below for reference.    Significant Diagnostic Studies: CT Abdomen Pelvis W Contrast  Result Date: 05/04/2020 CLINICAL DATA:  Diverticulitis.  Diarrhea. EXAM: CT ABDOMEN AND PELVIS WITH CONTRAST TECHNIQUE: Multidetector CT imaging of the abdomen and pelvis was performed using the standard protocol following bolus administration of intravenous contrast. CONTRAST:  110mL OMNIPAQUE IOHEXOL 300 MG/ML  SOLN COMPARISON:  01/10/2008 FINDINGS: Lower chest: Pulmonary scarring and peripheral emphysematous change right more than left. No active pulmonary or lower chest process. Hepatobiliary: Previous cholecystectomy. Intra and extrahepatic biliary ductal dilatation, with the common duct measuring up to 2 cm in diameter. Duct is dilated all the way to the M Pugh low. No visible obstructing stone or mass. No evidence of hepatic parenchymal lesion. Pancreas: Negative Spleen: Normal  Adrenals/Urinary Tract: Adrenal glands are normal. Kidneys are normal. No cyst, mass, stone or hydronephrosis. Bladder is normal. Stomach/Bowel: Stomach and small intestine are normal. Patient does have some liquid stool in the colon consistent with the history of diarrhea. Very minimal diverticulosis. No sign of diverticulitis. Vascular/Lymphatic: Aortic atherosclerosis. Maximal diameter the infrarenal abdominal aorta 2.7 cm. Reproductive: Normal Other: No free fluid or air. Musculoskeletal: Ordinary lumbar degenerative changes. IMPRESSION: 1. Liquid stool in the colon consistent with the history of diarrhea. No sign of diverticulitis. 2. Previous cholecystectomy. Intra and extrahepatic biliary ductal dilatation, with the common duct measuring up to 2 cm in diameter. No visible obstructing stone or mass. Are there clinical or laboratory concerns regarding biliary obstruction? Aortic Atherosclerosis (ICD10-I70.0). Electronically Signed   By: Jan Fireman.D.  On: 05/04/2020 14:47   MR 3D Recon At Scanner  Result Date: 05/06/2020 CLINICAL DATA:  Evaluate common bile duct dilatation. EXAM: MRI ABDOMEN WITHOUT AND WITH CONTRAST (INCLUDING MRCP) TECHNIQUE: Multiplanar multisequence MR imaging of the abdomen was performed both before and after the administration of intravenous contrast. Heavily T2-weighted images of the biliary and pancreatic ducts were obtained, and three-dimensional MRCP images were rendered by post processing. CONTRAST:  78mL GADAVIST GADOBUTROL 1 MMOL/ML IV SOLN COMPARISON:  CT AP 05/04/2020 FINDINGS: Lower chest: Bilateral pleural effusions identified. Hepatobiliary: On the arterial phase portion of the exam there is a small, hyperenhancing focus within the lateral right hepatic lobe. No corresponding signal abnormality on the precontrast T1 weighted images or the T2 weighted images. There is no washout associated with this structure on delayed imaging. Previous cholecystectomy. Mild  intrahepatic bile duct dilatation. Fusiform common bile duct dilatation is again noted which measures up to 1.8 cm, image 13/6. No choledocholithiasis identified. Pancreas: No pancreatic inflammation, main duct dilatation or mass identified. Spleen:  Within normal limits in size and appearance. Adrenals/Urinary Tract: Normal appearance of the adrenal glands. No kidney mass or hydronephrosis identified. Stomach/Bowel: Stomach is nondistended. Liquid stool is again noted within the colon compatible with a diarrheal state. No small bowel dilatation. Vascular/Lymphatic: No aneurysm identified. No signs of upper abdominal adenopathy. Other:  No free fluid or fluid collections within the abdomen. Musculoskeletal: No suspicious bone lesions identified. IMPRESSION: 1. Previous cholecystectomy. Fusiform common bile duct dilatation which measures up to 1.8 cm. No choledocholithiasis identified. In the setting of a normal bilirubin findings are favored to represent sequelae of post cholecystectomy physiology. 2. Small, hyperenhancing focus within the lateral right hepatic lobe is identified on the arterial phase portion of the exam. In the absence of risk factors for hepatoma this is favored to represent a benign abnormality. Recommend follow-up liver protocol CT or MRI in 6 months to ensure stability. 3. Liquid stool within the colon compatible with a diarrheal state. 4. Bilateral pleural effusions. Electronically Signed   By: Kerby Moors M.D.   On: 05/06/2020 10:09   DG Chest Port 1 View  Result Date: 05/04/2020 CLINICAL DATA:  COPD.  Hypertension.  Vomiting and diarrhea. EXAM: PORTABLE CHEST 1 VIEW COMPARISON:  12/11/2017 FINDINGS: Artifact overlies the chest. The lungs are hyperinflated consistent with emphysema. Some scattered areas of pulmonary scarring. Evidence of infiltrate, collapse edema or effusion. Ordinary aortic atherosclerosis. IMPRESSION: No active disease. Emphysema. Aortic atherosclerosis.  Electronically Signed   By: Nelson Chimes M.D.   On: 05/04/2020 12:48   MR ABDOMEN MRCP W WO CONTAST  Result Date: 05/06/2020 CLINICAL DATA:  Evaluate common bile duct dilatation. EXAM: MRI ABDOMEN WITHOUT AND WITH CONTRAST (INCLUDING MRCP) TECHNIQUE: Multiplanar multisequence MR imaging of the abdomen was performed both before and after the administration of intravenous contrast. Heavily T2-weighted images of the biliary and pancreatic ducts were obtained, and three-dimensional MRCP images were rendered by post processing. CONTRAST:  64mL GADAVIST GADOBUTROL 1 MMOL/ML IV SOLN COMPARISON:  CT AP 05/04/2020 FINDINGS: Lower chest: Bilateral pleural effusions identified. Hepatobiliary: On the arterial phase portion of the exam there is a small, hyperenhancing focus within the lateral right hepatic lobe. No corresponding signal abnormality on the precontrast T1 weighted images or the T2 weighted images. There is no washout associated with this structure on delayed imaging. Previous cholecystectomy. Mild intrahepatic bile duct dilatation. Fusiform common bile duct dilatation is again noted which measures up to 1.8 cm, image 13/6. No choledocholithiasis identified.  Pancreas: No pancreatic inflammation, main duct dilatation or mass identified. Spleen:  Within normal limits in size and appearance. Adrenals/Urinary Tract: Normal appearance of the adrenal glands. No kidney mass or hydronephrosis identified. Stomach/Bowel: Stomach is nondistended. Liquid stool is again noted within the colon compatible with a diarrheal state. No small bowel dilatation. Vascular/Lymphatic: No aneurysm identified. No signs of upper abdominal adenopathy. Other:  No free fluid or fluid collections within the abdomen. Musculoskeletal: No suspicious bone lesions identified. IMPRESSION: 1. Previous cholecystectomy. Fusiform common bile duct dilatation which measures up to 1.8 cm. No choledocholithiasis identified. In the setting of a normal  bilirubin findings are favored to represent sequelae of post cholecystectomy physiology. 2. Small, hyperenhancing focus within the lateral right hepatic lobe is identified on the arterial phase portion of the exam. In the absence of risk factors for hepatoma this is favored to represent a benign abnormality. Recommend follow-up liver protocol CT or MRI in 6 months to ensure stability. 3. Liquid stool within the colon compatible with a diarrheal state. 4. Bilateral pleural effusions. Electronically Signed   By: Kerby Moors M.D.   On: 05/06/2020 10:09    Microbiology: Recent Results (from the past 240 hour(s))  Respiratory Panel by RT PCR (Flu A&B, Covid) - Nasopharyngeal Swab     Status: None   Collection Time: 05/04/20  1:51 PM   Specimen: Nasopharyngeal Swab  Result Value Ref Range Status   SARS Coronavirus 2 by RT PCR NEGATIVE NEGATIVE Final    Comment: (NOTE) SARS-CoV-2 target nucleic acids are NOT DETECTED.  The SARS-CoV-2 RNA is generally detectable in upper respiratoy specimens during the acute phase of infection. The lowest concentration of SARS-CoV-2 viral copies this assay can detect is 131 copies/mL. A negative result does not preclude SARS-Cov-2 infection and should not be used as the sole basis for treatment or other patient management decisions. A negative result may occur with  improper specimen collection/handling, submission of specimen other than nasopharyngeal swab, presence of viral mutation(s) within the areas targeted by this assay, and inadequate number of viral copies (<131 copies/mL). A negative result must be combined with clinical observations, patient history, and epidemiological information. The expected result is Negative.  Fact Sheet for Patients:  PinkCheek.be  Fact Sheet for Healthcare Providers:  GravelBags.it  This test is no t yet approved or cleared by the [place] FDA and  has been  authorized for detection and/or diagnosis of SARS-CoV-2 by FDA under an Emergency Use Authorization (EUA). This EUA will remain  in effect (meaning this test can be used) for the duration of the COVID-19 declaration under Section 564(b)(1) of the Act, 21 U.S.C. section 360bbb-3(b)(1), unless the authorization is terminated or revoked sooner.     Influenza A by PCR NEGATIVE NEGATIVE Final   Influenza B by PCR NEGATIVE NEGATIVE Final    Comment: (NOTE) The Xpert Xpress SARS-CoV-2/FLU/RSV assay is intended as an aid in  the diagnosis of influenza from Nasopharyngeal swab specimens and  should not be used as a sole basis for treatment. Nasal washings and  aspirates are unacceptable for Xpert Xpress SARS-CoV-2/FLU/RSV  testing.  Fact Sheet for Patients: PinkCheek.be  Fact Sheet for Healthcare Providers: GravelBags.it  This test is not yet approved or cleared by the [place] FDA and  has been authorized for detection and/or diagnosis of SARS-CoV-2 by  FDA under an Emergency Use Authorization (EUA). This EUA will remain  in effect (meaning this test can be used) for the duration of the  Covid-19 declaration under Section 564(b)(1) of the Act, 21  U.S.C. section 360bbb-3(b)(1), unless the authorization is  terminated or revoked. Performed at [facility], [place] [address], [place], [place] [address]   Gastrointestinal Panel by PCR , Stool     Status: Abnormal   Collection Time: 05/05/20 10:07 AM   Specimen: Stool  Result Value Ref Range Status   Campylobacter species DETECTED (A) NOT DETECTED Final    Comment: RESULT CALLED TO, READ BACK BY AND VERIFIED WITH: JENNIFER PAFFT 05/06/20 1426 KLW    Plesimonas shigelloides NOT DETECTED NOT DETECTED Final   Salmonella species NOT DETECTED NOT DETECTED Final   Yersinia enterocolitica NOT DETECTED NOT DETECTED Final   Vibrio species NOT DETECTED NOT DETECTED  Final   Vibrio cholerae NOT DETECTED NOT DETECTED Final   Enteroaggregative E coli (EAEC) NOT DETECTED NOT DETECTED Final   Enteropathogenic E coli (EPEC) NOT DETECTED NOT DETECTED Final   Enterotoxigenic E coli (ETEC) NOT DETECTED NOT DETECTED Final   Shiga like toxin producing E coli (STEC) NOT DETECTED NOT DETECTED Final   Shigella/Enteroinvasive E coli (EIEC) NOT DETECTED NOT DETECTED Final   Cryptosporidium NOT DETECTED NOT DETECTED Final   Cyclospora cayetanensis NOT DETECTED NOT DETECTED Final   Entamoeba histolytica NOT DETECTED NOT DETECTED Final   Giardia lamblia NOT DETECTED NOT DETECTED Final   Adenovirus F40/41 NOT DETECTED NOT DETECTED Final   Astrovirus NOT DETECTED NOT DETECTED Final   Norovirus GI/GII NOT DETECTED NOT DETECTED Final   Rotavirus A NOT DETECTED NOT DETECTED Final   Sapovirus (I, II, IV, and V) NOT DETECTED NOT DETECTED Final    Comment: Performed at [facility], [address] [place], [place] [address]  C Difficile Quick Screen w PCR reflex     Status: None   Collection Time: 05/05/20 10:07 AM   Specimen: Stool  Result Value Ref Range Status   C Diff antigen NEGATIVE NEGATIVE Final   C Diff toxin NEGATIVE NEGATIVE Final   C Diff interpretation No C. difficile detected.  Final    Comment: Performed at [facility], [place] [address] [place], [place] [address]     Labs: Basic Metabolic Panel: Recent Labs  Lab 05/04/20 1214 05/05/20 0454 05/06/20 0522 05/07/20 0538 05/08/20 0425  NA 140 140 143 141 138  K 2.7* 2.6* 3.0* 3.7 3.4*  CL 105 107 113* 110 107  CO2 24 24 21* 24 23  GLUCOSE 97 91 90 96 89  BUN 21 12 9 10 13   CREATININE 1.38* 1.18 1.11 1.21 1.00  CALCIUM 8.7* 8.1* 8.2* 8.0* 7.9*  MG 2.1  --  1.7 2.3 1.9  PHOS  --   --  2.0* 2.7 3.0   Liver Function Tests: Recent Labs  Lab 05/04/20 1214 05/05/20 0454 05/06/20 0522 05/07/20 0538 05/08/20 0425  AST 52* 36 44* 31  --   ALT 49* 42 44 40  --    ALKPHOS 115 92 84 92  --   BILITOT 1.8* 1.5* 1.1 0.9  --   PROT 6.5 5.2* 5.0* 5.2*  --   ALBUMIN 3.8 3.1* 2.9* 3.0* 2.8*   No results for input(s): LIPASE, AMYLASE in the last 168 hours. No results for input(s): AMMONIA in the last 168 hours. CBC: Recent Labs  Lab 05/04/20 1214 05/05/20 0454 05/06/20 0522 05/07/20 0538 05/08/20 0425  WBC 4.8 5.2 4.7 5.1 4.8  NEUTROABS 2.8  --   --  3.1 2.8  HGB 13.6  10.9* 11.1* 11.3* 10.4*  HCT 38.9* 32.0* 32.8* 33.8* 30.2*  MCV 96.8 98.2 100.0 99.1 98.4  PLT 180 140* 144* 160 137*   Cardiac Enzymes: No results for input(s): CKTOTAL, CKMB, CKMBINDEX, TROPONINI in the last 168 hours. BNP: BNP (last 3 results) No results for input(s): BNP in the last 8760 hours.  ProBNP (last 3 results) No results for input(s): PROBNP in the last 8760 hours.  CBG: Recent Labs  Lab 05/04/20 1212  GLUCAP 92       Signed:  Irine Seal MD.  [facility] 05/08/2020, 12:35 PM

## 2020-05-18 DIAGNOSIS — I10 Essential (primary) hypertension: Secondary | ICD-10-CM | POA: Diagnosis not present

## 2020-05-18 DIAGNOSIS — I480 Paroxysmal atrial fibrillation: Secondary | ICD-10-CM | POA: Diagnosis not present

## 2020-05-18 DIAGNOSIS — R350 Frequency of micturition: Secondary | ICD-10-CM | POA: Diagnosis not present

## 2020-05-18 DIAGNOSIS — K529 Noninfective gastroenteritis and colitis, unspecified: Secondary | ICD-10-CM | POA: Diagnosis not present

## 2020-06-07 ENCOUNTER — Other Ambulatory Visit: Payer: Self-pay | Admitting: Physician Assistant

## 2020-08-03 DIAGNOSIS — K769 Liver disease, unspecified: Secondary | ICD-10-CM | POA: Diagnosis not present

## 2020-08-03 DIAGNOSIS — A09 Infectious gastroenteritis and colitis, unspecified: Secondary | ICD-10-CM | POA: Diagnosis not present

## 2020-08-19 ENCOUNTER — Other Ambulatory Visit: Payer: Self-pay | Admitting: Physician Assistant

## 2020-08-19 DIAGNOSIS — N1831 Chronic kidney disease, stage 3a: Secondary | ICD-10-CM | POA: Diagnosis not present

## 2020-08-19 DIAGNOSIS — I480 Paroxysmal atrial fibrillation: Secondary | ICD-10-CM | POA: Diagnosis not present

## 2020-08-19 DIAGNOSIS — R110 Nausea: Secondary | ICD-10-CM | POA: Diagnosis not present

## 2020-08-19 DIAGNOSIS — I10 Essential (primary) hypertension: Secondary | ICD-10-CM | POA: Diagnosis not present

## 2020-09-03 ENCOUNTER — Telehealth: Payer: Self-pay

## 2020-09-03 NOTE — Telephone Encounter (Signed)
Attempted to contact patient's daughter Thayer Headings to schedule a Palliative Care consult appointment. No answer left a message to return call.

## 2020-09-06 ENCOUNTER — Other Ambulatory Visit: Payer: Self-pay | Admitting: Physician Assistant

## 2020-09-08 ENCOUNTER — Telehealth: Payer: Self-pay

## 2020-09-08 NOTE — Telephone Encounter (Signed)
Spoke with patient's daughter Thayer Headings and scheduled an in-person Palliative Consult for 09/15/20 @ [facility] screening was negative. Dogs in the home, will put away before NP arrives. Patient lives with daughter.  Consent obtained; updated Outlook/Netsmart/Team List and Epic.  Family is aware they will be receiving a call from NP the day before or day of to confirm appointment.

## 2020-09-15 ENCOUNTER — Other Ambulatory Visit: Payer: Medicare Other | Admitting: Nurse Practitioner

## 2020-09-15 ENCOUNTER — Other Ambulatory Visit: Payer: Self-pay

## 2020-09-15 DIAGNOSIS — G4700 Insomnia, unspecified: Secondary | ICD-10-CM | POA: Diagnosis not present

## 2020-09-15 DIAGNOSIS — Z515 Encounter for palliative care: Secondary | ICD-10-CM

## 2020-09-15 NOTE — Progress Notes (Signed)
[place] Consult Note Telephone: [phone]  Fax: [phone]  PATIENT NAME: Thomas Doyle [address] [place] [address] [phone] (home)  DOB: 1928-11-24 MRN: [id number]  PRIMARY CARE PROVIDER:    Leeroy Cha, MD,  [place]. [address] [place] [address] [phone]  REFERRING PROVIDER:   Leeroy Cha, MD [address] [place],  [place] [address] [phone]  RESPONSIBLE PARTY:   Extended Emergency Contact Information Primary Emergency Contact: Doyle,Thomas Address: [address]          [place], [place] [address] Thomas Doyle of [place] Phone: [phone] Mobile Phone: [phone] Relation: Daughter Secondary Emergency Contact: Adam Doyle, Thomas [address] Thomas Doyle of [place] Phone: [phone] Mobile Phone: [phone] Relation: Daughter  I met face to face with patient and daughter in home.   ASSESSMENT AND RECOMMENDATIONS:   Advance Care Planning: Today's visit consisted of building trust and discussions on Palliative care medicine as a specialized medical care for people living with serious illness, aimed at facilitating improved quality of life through symptoms relief, assisting with advance care planning and establishing goals of care. [name] present at visit, they expressed appreciation for education provided on Palliative care and how it differs from Hospice service. Palliative care will continue to provide support to patient, family and the medical team. Goal of care: Patient's goal of care is comfort. He expressed awareness that he has metastatic cancer that is not curative. He does not want anything aggressive to prolong his life.   Directives: Patient Patient wish to donate his body to [employer] for [employer]. Patient code status is DNR. He reiterated desire to not be  resuscitated in the event of cardiac or respiratory arrest. Patient has a signed MOST form on file. Daughter report his DNR form was missing, a new DNR form signed for him today. Details of MOST form include; Comfort measure, antibiotics if indicated, IV fluids if indicated, no feeding tube.  Symptom Management:  Nausea: Report occasional nausea which is relieved by PRN Promethazine. Denied nausea during visit today. Insomnia: Has tried Ambien and Melatonin without relief.  Report Ambien gives him nightmares. Recommend starting patient on Trazodone 29m QHS and titrated as tolerated. Pain: no pain, just numbness in bilateral feet. Uses cane, refuses to use walker, or any other assistive device. Has frequent falls, trips on himself. No report of injuries. Encourge the use of walker to provide stability. Bradycardia: Patient takes Amlodipine 135mdaily, Amiodarone 30050maily and Metoprolol succinate 5m18mily. Patient report occasional bradycardia with heart rate dropping to 30s, patient report weakness and fatique when his HR rate drops. Recommend the Metoprolol at taken at bedtime vs morning to reduce symptoms of bradycardia, HR today is 62. Dyspnea of exertion: Patient endorsed occasional wheezing. Recommendation: Patient would benefit from PRN Albuterol for SOB or wheezing.   Called his pcp office (Dr. V) tClayton Bibles discuss recommendation. Left a message on nurse line for call back.  Follow up Palliative Care Visit: Palliative care will continue to follow for complex decision making and symptom management. Return in about 4 weeks or prn.  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  Cognitive / Functional decline: Patient  awake, alert and coherent. He is able to make his own decisions. He is independent with his  ADLs, ambulates with a cane. Has frequent falls/near falls related to numbness in his bilateral feet.  I spent 50 minutes providing this consultation, time includes time spent with patient and daughter, chart review, provider coordination, and documentation. More than 50% of the time in this consultation was spent counseling and coordinating communication.   CHIEF COMPLAINT: Insomnia  History obtained from review of EMR and discussion with patient and daughter. Records reviewed and summarized bellow.  HISTORY OF PRESENT ILLNESS:  Thomas Doyle is a 87 y.o. year old male with multiple medical problems including large B-cell lymphoma stage 4, Non Hodgkin's lymphoma, Paroxysmal Afib, sick sinus syndrome, COPD, Non HTN. Patient desires comfort, stopped treatment for his cancer and anticoagulation. 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 Palliative Care was asked to follow this patient by consultation request of Thomas Doyle,* to help address advance care planning, goals of care ans symptoms management. This is an initial visit.  CODE STATUS: DNR  PPS: 40%  HOSPICE ELIGIBILITY/DIAGNOSIS: TBD  ROS General: denied fever, denied chills EYES: denied acute vision changes ENMT: denies dysphagia Cardiovascular: denies chest pain, denied palpitation Pulmonary: denies cough, endorsed dyspnea on exertion, wheezing Abdomen: endorses fair appetite, denied constipation, denied incontinence of bowel GU: denies dysuria, endorses occasional incontinence of urine MSK: denied ROM limitations Skin: denied wounds Neurological: endorses weakness, denies pain Psych: Endorses positive moods Heme/lymph/immuno: easy bruising   Physical Exam: Vital signs: BP 158/58 (automatic), 162/58 (manual), P 62, RR 16, 98% on room air Current and past weights: 150lbs down from baseline weight of 160lbs.  Constitutional: NAD, sitting in chair in living room actively participating in discussion   General: frail appearing EYES: anicteric sclera, lids intact, no discharge  ENMT: intact hearing, oral mucous membranes moist CV:  no LE edema Pulmonary: no increased work of breathing, no cough, no audible wheezes, room air Abdomen: no ascites GU: deferred MSK:, decreased ROM in all extremities, no contractures of LE, ambulatory Skin: warm and dry, ecchymotic areas to bilateral arms  Neuro: Generalized weakness Psych: non-anxious affect today, A and O x 3 Hem/lymph/immuno: no widespread bruising   PAST MEDICAL HISTORY:  Past Medical History:  Diagnosis Date  . AAA (abdominal aortic aneurysm) (HCC)    intrarenal  . Anemia   . COPD (chronic obstructive pulmonary disease) [facility]) dx April 2013  . Hypertension   . Large B-cell lymphoma ([place]) 11/2011  . Mitral valve prolapse   . Non Hodgkin's lymphoma ([place])   . PAF (paroxysmal atrial fibrillation) (HCC)    On amiodarone, digoxin. Not on coumadin  . Pneumonia April 2013  . Right bundle branch block (RBBB)   . Sick sinus syndrome (HCC)     SOCIAL HX:  Social History   Tobacco Use  . Smoking status: Never Smoker  . Smokeless tobacco: Never Used  Substance Use Topics  . Alcohol use: No   FAMILY HX:  Family History  Problem Relation Age of Onset  . Atrial fibrillation Mother   . Cancer Father   . Heart attack Father     ALLERGIES:  Allergies  Allergen Reactions  . Codeine Nausea Only     PERTINENT MEDICATIONS:  Outpatient Encounter Medications as of 09/15/2020  Medication Sig  . amiodarone (PACERONE) 200 MG tablet TAKE 1 AND 1/2 TABLETS BY MOUTH DAILY  . amLODipine (NORVASC) 10 MG tablet TAKE ONE TABLET BY MOUTH DAILY  .  aspirin 81 MG tablet Take 81 mg by mouth daily.  . cholecalciferol (VITAMIN D3) 25 MCG (1000 UT) tablet Take 1,000 Units by mouth daily.  [name] omega-3 acid ethyl esters (LOVAZA) 1 g capsule TAKE ONE CAPSULE BY MOUTH DAILY WITH FOOD  . rOPINIRole (REQUIP) 1 MG tablet Take 1 mg by mouth daily as needed  (for restless legs).   . tamsulosin (FLOMAX) 0.4 MG CAPS capsule Take 0.4 mg by mouth daily as needed (pt preference).   . vitamin B-12 (CYANOCOBALAMIN) 1000 MCG tablet Take 1,000 mcg by mouth daily.  [name] zolpidem (AMBIEN) 5 MG tablet Take 1 tablet (5 mg total) by mouth at bedtime as needed. for sleep (Patient not taking: Reported on 05/04/2020)   No facility-administered encounter medications on file as of 09/15/2020.    Thank you for the opportunity to participate in the care of [name]. The palliative care team will continue to follow. Please call our office at [phone] if we can be of additional assistance.   Jari Favre, DNP, AGPCNP-BC

## 2020-10-16 ENCOUNTER — Other Ambulatory Visit: Payer: Self-pay

## 2020-10-16 ENCOUNTER — Other Ambulatory Visit: Payer: Medicare Other | Admitting: Nurse Practitioner

## 2020-10-16 DIAGNOSIS — Z515 Encounter for palliative care: Secondary | ICD-10-CM | POA: Diagnosis not present

## 2020-10-16 DIAGNOSIS — G4700 Insomnia, unspecified: Secondary | ICD-10-CM | POA: Diagnosis not present

## 2020-10-16 NOTE — Progress Notes (Signed)
[place] Consult Note Telephone: [phone]  Fax: [phone]  PATIENT NAME: Thomas Doyle [address] [place] [address] [phone] (home)  DOB: Sep 19, 1928 MRN: [id number]  PRIMARY CARE PROVIDER:    Leeroy Cha, MD,  [place]. [address] [place] [address] [phone]  REFERRING PROVIDER:   Leeroy Cha, MD [address] [place],  [place] [address] [phone]  RESPONSIBLE PARTY:   Extended Emergency Contact Information Primary Emergency Contact: Febres,Janice Address: [address]          [place], [place] [address] Johnnette Litter of [place] Phone: [phone] Mobile Phone: [phone] Relation: Daughter Secondary Emergency Contact: Adam Phenix, [place] [address] Johnnette Litter of [place] Phone: [phone] Mobile Phone: [phone] Relation: Daughter  I met face to face with patient and family in home.   ASSESSMENT AND RECOMMENDATIONS:   Advance Care Planning: ACP reviewed with patient. Patient's goal of care is comfort. He does not desire agressive treatment for any illness. Signed DNR and MOST forms present in home, copy on [facility] Epic EMR. Details of MOST form include; Comfort measure, antibiotics if indicated, IV fluids if indicated, no feeding tube.  Symptom Management:  Cancer: patient reiterated decision to not pursue/continue treatment. Verbalized readiness to meet his late wife in after life. He denied depression, denied suicide or homicidal ideation.  Recommendation: Continue supportive care. Insomnia: Report Insomnia is ongoing, report having night mares with prescribed Trazodone, stopped after 2 doses. Report taking a Alprazolam XR 0.65m prescribed for another family member with good results, report sleeping through the night. He has tried Ambien and Melatonin without improvement in condition. Patient advised  against taking medication not prescribed by his physician. Recommendation: Consider starting patient on Alprazolam XR 0.575mas needed at bedtime for sleep. Called his PCP office with recommendation. Neuropathy: bilateral lower extremity numbness secondary to chemotherapy treatment resulting in unsteady gait and falls. Patient uses cane for ambulation, daughter report patient has a walker in home that he can use. Recommendation: Recommend patient use walker for ambulation to enhance stability. Encourage removing rugs from hall ways and bed room, Encourage use of grab bars and shower chair while bathroom..  Pain: patient report pain in tail bone from mechanical fall sustained 4 days ago. Declined physical exam of the area today, daughter verbalized unawareness of the fall. Report 6/10 pain, report taking Tylenol 65096ms needed with moderate relief. Recommendation: recommend starting patient on Tylenol 650m59m mouth three times daily for 5 days. Then continue as needed.  Follow up Palliative Care Visit: Palliative care will continue to follow for complex decision making and symptom management. Return in about 4-6 weeks or prn.  Family /Caregiver/Community Supports: Patient lives with his daughter JaniThayer Headings is his main caregiver. 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 He is independent with his ADLs, ambulates with a cane. Has frequent falls/near falls related to numbness in his bilateral feet.  I spent 30 minutes providing this consultation. More than 50% of the time in this consultation was spent counseling and coordinating communication.   CHIEF COMPLAINT: Insomnia  History obtained from review of EMR and discussion with patient and his daughter. Records reviewed and summarized bellow.  HISTORY OF PRESENT ILLNESS:  Thomas Doyle  87 y.o. year old male with  multiple medical problems including large B-cell lymphoma stage 4, Non Hodgkin's lymphoma, Paroxysmal Afib, sick sinus syndrome, COPD, HTN, lower extremity neuropathy. Patient stopped treatment for his cancer and anticoagulation for his heart disease as his goal is for comfort. He has neuropathy in bilateral feet a side effect from chemo treatment which leads to his frequent falls. Patient report progressive weakness and decline in function, verbalized awareness that it is likely related to his diease. He denied fever, denied chills, denies increased SOB, all 10 point systems reviewed and are negative except as documented above. Palliative Care was asked to help address advance care planning, goals of care and symptoms management. This is a follow up visit from 09/15/2020.  CODE STATUS: DNR  PPS: 40%  HOSPICE ELIGIBILITY/DIAGNOSIS: TBD  Physical Exam: General: chronically ill and frail appearing, thin, sitting in chair in NAD EYES: anicteric sclera, lids intact, no discharge  ENMT: intact hearing, oral mucous membranes moist CV:  no LE edema, S1S2 normal Pulmonary: no increased work of breathing, no cough, no audible wheezes, room air Abdomen: no ascites GU: deferred MSK: moves all extremities, no contractures, ambulatory Skin: warm and dry, scattered healing bruises noted to visible skin Neuro: Generalized weakness, A and O x 4 Psych: non-anxious affect today Hem/lymph/immuno: no acute bleeding   PAST MEDICAL HISTORY:  Past Medical History:  Diagnosis Date  . AAA (abdominal aortic aneurysm) (HCC)    intrarenal  . Anemia   . COPD (chronic obstructive pulmonary disease) [facility]) dx April 2013  . Hypertension   . Large B-cell lymphoma ([place]) 11/2011  . Mitral valve prolapse   . Non Hodgkin's lymphoma ([place])   . PAF (paroxysmal atrial fibrillation) (HCC)    On amiodarone, digoxin. Not on coumadin  . Pneumonia April 2013  . Right bundle branch block (RBBB)   . Sick sinus syndrome (HCC)      SOCIAL HX:  Social History   Tobacco Use  . Smoking status: Never Smoker  . Smokeless tobacco: Never Used  Substance Use Topics  . Alcohol use: No   FAMILY HX:  Family History  Problem Relation Age of Onset  . Atrial fibrillation Mother   . Cancer Father   . Heart attack Father     ALLERGIES:  Allergies  Allergen Reactions  . Codeine Nausea Only     PERTINENT MEDICATIONS:  Outpatient Encounter Medications as of 10/16/2020  Medication Sig  . amiodarone (PACERONE) 200 MG tablet TAKE 1 AND 1/2 TABLETS BY MOUTH DAILY  . amLODipine (NORVASC) 10 MG tablet TAKE ONE TABLET BY MOUTH DAILY  . aspirin 81 MG tablet Take 81 mg by mouth daily.  . cholecalciferol (VITAMIN D3) 25 MCG (1000 UT) tablet Take 1,000 Units by mouth daily.  [name] omega-3 acid ethyl esters (LOVAZA) 1 g capsule TAKE ONE CAPSULE BY MOUTH DAILY WITH FOOD  . rOPINIRole (REQUIP) 1 MG tablet Take 1 mg by mouth daily as needed (for restless legs).   . tamsulosin (FLOMAX) 0.4 MG CAPS capsule Take 0.4 mg by mouth daily as needed (pt preference).   . vitamin B-12 (CYANOCOBALAMIN) 1000 MCG tablet Take 1,000 mcg by mouth daily.  [name] zolpidem (AMBIEN) 5 MG tablet Take 1 tablet (5 mg total) by mouth at bedtime as needed. for sleep (Patient not taking: Reported on 05/04/2020)   No facility-administered encounter medications on file as of 10/16/2020.    Thank you for the opportunity to participate in the care of Dorian Duval. The  palliative care team will continue to follow. Please call our office at [phone] if we can be of additional assistance.  Jari Favre, DNP, AGPCNP-BC

## 2020-10-19 DIAGNOSIS — R110 Nausea: Secondary | ICD-10-CM | POA: Diagnosis not present

## 2020-10-19 DIAGNOSIS — N401 Enlarged prostate with lower urinary tract symptoms: Secondary | ICD-10-CM | POA: Diagnosis not present

## 2020-10-19 DIAGNOSIS — I480 Paroxysmal atrial fibrillation: Secondary | ICD-10-CM | POA: Diagnosis not present

## 2020-10-19 DIAGNOSIS — F5101 Primary insomnia: Secondary | ICD-10-CM | POA: Diagnosis not present

## 2020-11-15 ENCOUNTER — Other Ambulatory Visit: Payer: Self-pay | Admitting: Physician Assistant

## 2020-11-27 ENCOUNTER — Other Ambulatory Visit: Payer: Self-pay

## 2020-11-27 ENCOUNTER — Other Ambulatory Visit: Payer: Medicare Other | Admitting: Nurse Practitioner

## 2020-11-27 DIAGNOSIS — Z515 Encounter for palliative care: Secondary | ICD-10-CM | POA: Diagnosis not present

## 2020-11-27 DIAGNOSIS — R296 Repeated falls: Secondary | ICD-10-CM | POA: Diagnosis not present

## 2020-11-27 DIAGNOSIS — G4700 Insomnia, unspecified: Secondary | ICD-10-CM

## 2020-11-27 NOTE — Progress Notes (Signed)
[place] Consult Note Telephone: [phone]  Fax: [phone]    Date of encounter: 11/27/20 PATIENT NAME: Thomas Doyle [address] [place] [address]   [phone] (home)  DOB: [birth year] MRN: [id number]  PRIMARY CARE PROVIDER:    Leeroy Cha, MD,  [place]. [address] [place] [address] [phone]  REFERRING PROVIDER:   Leeroy Cha, MD [address] [place],  [place] [address] [phone]  RESPONSIBLE PARTY:    Contact Information    Name Relation Home Work Mobile   Thomas Doyle,Thomas Doyle Daughter [phone]  [phone]   Thomas Doyle Daughter [phone]  [phone]     I met face to face with patient in home. [name] present during visit. Palliative Care was asked to follow this patient by consultation request of  Verdis Frederickson address advance care planning and complex medical decision making. This is a follow up visit.                                  ASSESSMENT AND PLAN / RECOMMENDATIONS:   Advance Care Planning/Goals of Care: Goals include to maximize quality of life and symptom management. Our advance care planning conversation included a discussion about:     Experiences with loved ones who have been seriously ill or have died   Exploration of personal, cultural or spiritual beliefs that might influence medical decision  CODE STATUS: DNR Goal of care: Patient's goal of care is comfort. Directives: Patient reiterated desire to not be resuscitated in the event of cardiac or respiratory arrest. He verbalized desire to donate his body to medical research at [facility]. He verbalized being at peace with death and dying and looked forward to meeting his late wife and other relatives that are deceased. He denied being suicidal. Validation provided. Provided general support and counseling.  I spent 25 minutes providing this  consultation. More than 50% of the time in this consultation was spent counseling and coordinating communication. -----------------------------------------------------------------------------  Symptom Management/Plan: Insomnia: Instructed to stop taking Alprazolam 0.5mg . Presecription sent for Alprazolam XR 0.5mg , one tablet by mouth at night as needed for anxiety/insomnia. Discussed interventions to promote sleep such as avoidance of large meals, caffeine, and alcohol before bedtime. Frequent falls: Condition related to bilateral lower extremity neuropathy. Encouraged consistent use of Walker. Discussed fall prevention strategies. Encouraged asking for assistance when needed. Patient verbalized understanding. Questions and concerns were addressed. Patient and family was encouraged to call with questions and/or concerns. Palliative care will continue to provide support to patient, family and the medical team.  Follow up Palliative Care Visit: Palliative care will continue to follow for complex medical decision making, advance care planning, and clarification of goals. Return in about 4 weeks or prn.  PPS: 40%  HOSPICE ELIGIBILITY/DIAGNOSIS: Yes/ metastatic cancer and not on treatment. Patient however declined Hospice care at this time.  CHIEF COMPLAIN: follow up on Insomnia  History obtained from review of Epic EMR and discussion with patient and his [name].   HISTORY OF PRESENT ILLNESS:Thomas A Smithis a 87 y.o.year old malewith multiple medical problems including large B-cell lymphoma stage 4 not on treatment, Non Hodgkin's lymphoma, Paroxysmal Afib (not on anticoagulation), sick sinus syndrome, COPD, HTN, lower extremity neuropathy. Patient complained of ongoing insomnia. Patient has been tried on Trazodone and Ambien in the past with poor relief. Report Trazodone made him Hallucinate and Ambien was  not effective even with high dose. Patient was started on Alprazolam 0.$RemoveBeforeD'5mg'oBvEeplYpzAKzB$  by his  PCP last month, he report poor relief, saying he does not sleep through the night, wakes up in the early hours of the morning. He report trying a family member's Alprazolam ER 0.$RemoveBeforeD'5mg'kPTTkZxVunSsRy$  with great results. Patient report ongoing falls related to gait instability secondary to bilateral lower extremity neuropathy. Denied ED visit or injury from the fall. Daughter report patient not complaint with consistent use of walker for ambulation as advised at last visit. Patient denied uncontrolled pain, denied increased SOB, denied chest pain. Ten systems reviewed and are negative for acute change, except as noted in the HPI.   I reviewed available labs, medications, imaging, studies and related documents from the EMR.  Records reviewed and summarized above.   Physical Exam: General: chronically ill and frail appearing, thin, sitting in chair in NAD, actively participating in [place]: anicteric sclera, no discharge  ENMT: intact hearing, oral mucous membranes moist CV: no LE edema, S1S2 normal Pulmonary: no increased work of breathing, no cough, no audible wheezes, room air Abdomen:no ascites GU: deferred IPP:GFQMK all extremities, no contractures, ambulatory Skin: skin ashy, warm and dry, scattered healing bruises noted to visible skin Neuro: Generalized weakness, A and O x 4 Psych: non-anxious affecttoday Hem/lymph/immuno: no acute bleeding   Thank you for the opportunity to participate in the care of Mr. Dohrman.  The palliative care team will continue to follow. Please call our office at [phone] if we can be of additional assistance.   Alba Destine, NP DNP,AGPCNP-BC  COVID-19 PATIENT SCREENING TOOL Asked and negative response unless otherwise noted:   Have you had symptoms of covid, tested positive or been in contact with someone with symptoms/positive test in the past 5-10 days?

## 2021-01-01 ENCOUNTER — Other Ambulatory Visit: Payer: Medicare Other | Admitting: Nurse Practitioner

## 2021-01-01 ENCOUNTER — Other Ambulatory Visit: Payer: Self-pay

## 2021-01-01 VITALS — BP 118/52 | HR 43 | Resp 18 | Ht 70.0 in | Wt 147.6 lb

## 2021-01-01 DIAGNOSIS — C801 Malignant (primary) neoplasm, unspecified: Secondary | ICD-10-CM | POA: Diagnosis not present

## 2021-01-01 DIAGNOSIS — K909 Intestinal malabsorption, unspecified: Secondary | ICD-10-CM | POA: Diagnosis not present

## 2021-01-01 DIAGNOSIS — Z515 Encounter for palliative care: Secondary | ICD-10-CM | POA: Diagnosis not present

## 2021-01-01 DIAGNOSIS — R197 Diarrhea, unspecified: Secondary | ICD-10-CM | POA: Diagnosis not present

## 2021-01-01 DIAGNOSIS — G4701 Insomnia due to medical condition: Secondary | ICD-10-CM

## 2021-01-01 MED ORDER — ALPRAZOLAM ER 0.5 MG PO TB24: 0.5000 mg | ORAL_TABLET | Freq: Every evening | ORAL | Status: AC | PRN

## 2021-01-01 NOTE — Progress Notes (Signed)
[facility] Consult Note Telephone: [phone]  Fax: [phone]    Date of encounter: 01/01/21 PATIENT NAME: Thomas Doyle [address] [place] [address]   [phone] (home)  DOB: 19-Feb-1929 MRN: [id number]  PRIMARY CARE PROVIDER:    Leeroy Cha, MD,  [place]. [address] [place] [address] [phone]  REFERRING PROVIDER:   Leeroy Cha, MD [address] [place],  [place] [address] [phone]  RESPONSIBLE PARTY:    Contact Information     Name Relation Home Work Mobile   Thomas Doyle Daughter [phone]  [phone]   Thomas Doyle Daughter [phone]  [phone]      I met face to face with patient in home. His [name] present during visit. Palliative Care was asked to follow this patient by consultation request of  Thomas Doyle,* to address advance care planning and complex medical decision making. This is a follow up visit.                                   ASSESSMENT AND PLAN / RECOMMENDATIONS:   Advance Care Planning/Goals of Care:  CODE STATUS: DNR Goal of Care: Goal of care is comfort. Directives: Patient is donating his body to medical science at [employer]. Signed DNR and MOST forms present in home, copy on [facility] Epic EMR. Details of MOST form include comfort measures, antibiotics if indicated, IV fluids if indicated, no feeding tube.  Symptom Management/Plan: Insomnia: Condition is improved. Continue current plan of care with Alprazolam XR 0.$RemoveBeforeD'5mg'JqIGQyJlhDylUw$  at bedtime as  needed. Discussed risk vs benefit of long acting benzodiazepine in geriatric patient. Benefit deemed to outweigh its risk as patient's goal of care is comfort. Discussed need for consistent use of assistive device during ambulation to prevent falls. Patient verbalized understanding.  Diarrhea:  Occurred twice this week, relieved by antidiarrhea. No report  of antibiotic use in the last 90 days. Takes probiotics everyday. No report of abdominal pain, distention, nausea or vomiting. Patient believed diarrhea is related to his consumption of strawberries.  Metastatic cancer: denied uncontrolled pain. Report fair appetite. Not seeing oncology. Continue supportive care as indicated. Questions and concerns were addressed. Patient and family was encouraged to call with questions and/or concerns. Provided general support and encouragement, no other unmet needs identified at this time.  Follow up Palliative Care Visit: Palliative care will continue to follow for complex medical decision making, advance care planning, and clarification of goals. Return in about 6 weeks or prn.  PPS: 50%  HOSPICE ELIGIBILITY/DIAGNOSIS: yes/metastatic cancer, patient not treatment. Patient however verbalized unreadiness for Hospice care at this time.  CHIEF COMPLAIN: follow up palliative care for insomnia  History obtained from review of Epic EMR, discussion with Thomas Doyle and his [name]  HISTORY OF PRESENT ILLNESS:  Thomas Doyle is a 87 y.o. year old male with multiple medical problems including large B-cell lymphoma stage 4 not on treatment, Non Hodgkin's lymphoma, Paroxysmal Afib (not on anticoagulation), sick sinus syndrome, COPD, HTN, lower extremity neuropathy. Patient is seen today for follow up on complaint of insomnia. Patient has been tried on several medications including Trazodone and Ambien without improvement in symptoms. Patient at the last visit was prescribed Alprazolam XR 0.$RemoveBeforeD'5mg'qIMUlznLisWXHM$  at bed time as needed. Patient report sleeping better since starting Alprazolam XR, report sleeping up to 7hrs a night. Patient daughter report improvement  in his function during the day, saying he is not sleepy during the day any more. Patient report working on his lawn and yard in the last month. No report of falls in the last month, patient report occasional near fall  experience due to gait unsteadiness from neuropathy.  I reviewed available labs, medications, imaging, studies and related documents from the EMR.  Records reviewed and summarized above.   Vitals with BMI 01/01/2021 05/08/2020 05/08/2020  Height $Remov'5\' 10"'PXwVPA$  - -  Weight 147 lbs 10 oz - -  BMI 20.94 - -  Systolic 709 628 366  Diastolic 52 294 63  Pulse 43 71 50    Physical Exam: Current and past weights: Constitutional: NAD General: frail appearing, thin/WNWD/obese  CV: S1S2, RRR, no LE edema Pulmonary: LCTA, no increased work of breathing, no cough, room air MSK: sarcopenia, moves all extremities, ambulatory Skin: skin ashy and fragile,warm and dry, no rashes or wounds on visible skin Neuro: generalized weakness, no cognitive impairment Psych: non-anxious affect, A and O x 4 Hem/lymph/immuno: no widespread bruising  Past Medical History:  Diagnosis Date   AAA (abdominal aortic aneurysm) (HCC)    intrarenal   Anemia    COPD (chronic obstructive pulmonary disease) ([facility]) dx April 2013   Hypertension    Large B-cell lymphoma ([facility]) 11/2011   Mitral valve prolapse    Non Hodgkin's lymphoma (HCC)    PAF (paroxysmal atrial fibrillation) (HCC)    On amiodarone, digoxin. Not on coumadin   Pneumonia April 2013   Right bundle branch block (RBBB)    Sick sinus syndrome (HCC)     Meds ordered this encounter  Medications   ALPRAZolam (XANAX XR) 24 hr tablet 0.5 mg    Current Outpatient Medications on File Prior to Visit  Medication Sig Dispense Refill   amiodarone (PACERONE) 200 MG tablet TAKE 1 AND 1/2 TABLETS BY MOUTH DAILY 45 tablet 0   amLODipine (NORVASC) 10 MG tablet TAKE ONE TABLET BY MOUTH DAILY 90 tablet 2   aspirin 81 MG tablet Take 81 mg by mouth daily.     cholecalciferol (VITAMIN D3) 25 MCG (1000 UT) tablet Take 1,000 Units by mouth daily.     metoprolol succinate (TOPROL-XL) 25 MG 24 hr tablet Take 1 tablet (25 mg total) by mouth daily. Please schedule yearly appointment for  future refills. Thank you 30 tablet 0   omega-3 acid ethyl esters (LOVAZA) 1 g capsule TAKE ONE CAPSULE BY MOUTH DAILY WITH FOOD 30 capsule 0   rOPINIRole (REQUIP) 1 MG tablet Take 1 mg by mouth daily as needed (for restless legs).      tamsulosin (FLOMAX) 0.4 MG CAPS capsule Take 0.4 mg by mouth daily as needed (pt preference).   2   vitamin B-12 (CYANOCOBALAMIN) 1000 MCG tablet Take 1,000 mcg by mouth daily.     zolpidem (AMBIEN) 5 MG tablet Take 1 tablet (5 mg total) by mouth at bedtime as needed. for sleep (Patient not taking: Reported on 05/04/2020) 30 tablet 3   No current facility-administered medications on file prior to visit.    Thank you for the opportunity to participate in the care of Mr. Steenson.  The palliative care team will continue to follow. Please call our office at [phone] if we can be of additional assistance.   Jari Favre, DNP, AGPCNP-BC  COVID-19 PATIENT SCREENING TOOL Asked and negative response unless otherwise noted:   Have you had symptoms of covid, tested positive or been in contact with someone  with symptoms/positive test in the past 5-10 days?

## 2021-02-17 DIAGNOSIS — F5101 Primary insomnia: Secondary | ICD-10-CM | POA: Diagnosis not present

## 2021-02-17 DIAGNOSIS — I341 Nonrheumatic mitral (valve) prolapse: Secondary | ICD-10-CM | POA: Diagnosis not present

## 2021-02-17 DIAGNOSIS — J449 Chronic obstructive pulmonary disease, unspecified: Secondary | ICD-10-CM | POA: Diagnosis not present

## 2021-02-17 DIAGNOSIS — I10 Essential (primary) hypertension: Secondary | ICD-10-CM | POA: Diagnosis not present

## 2021-07-12 ENCOUNTER — Other Ambulatory Visit: Payer: Self-pay

## 2021-07-12 MED ORDER — AMLODIPINE BESYLATE 10 MG PO TABS: 10.0000 mg | ORAL_TABLET | Freq: Every day | ORAL | 0 refills | Status: AC

## 2021-07-21 ENCOUNTER — Encounter (HOSPITAL_COMMUNITY): Payer: Self-pay | Admitting: Internal Medicine

## 2021-07-21 ENCOUNTER — Inpatient Hospital Stay (HOSPITAL_COMMUNITY)
Admission: EM | Admit: 2021-07-21 | Discharge: 2021-07-26 | DRG: 177 | Disposition: A | Discharge status: Home Health | Payer: Medicare Other | Attending: Internal Medicine | Admitting: Internal Medicine

## 2021-07-21 ENCOUNTER — Inpatient Hospital Stay (HOSPITAL_COMMUNITY): Payer: Medicare Other

## 2021-07-21 ENCOUNTER — Other Ambulatory Visit: Payer: Self-pay

## 2021-07-21 ENCOUNTER — Emergency Department (HOSPITAL_COMMUNITY): Payer: Medicare Other

## 2021-07-21 DIAGNOSIS — R0602 Shortness of breath: Secondary | ICD-10-CM

## 2021-07-21 DIAGNOSIS — U071 COVID-19: Secondary | ICD-10-CM | POA: Diagnosis present

## 2021-07-21 DIAGNOSIS — Z8249 Family history of ischemic heart disease and other diseases of the circulatory system: Secondary | ICD-10-CM

## 2021-07-21 DIAGNOSIS — Z7982 Long term (current) use of aspirin: Secondary | ICD-10-CM

## 2021-07-21 DIAGNOSIS — Z66 Do not resuscitate: Secondary | ICD-10-CM | POA: Diagnosis present

## 2021-07-21 DIAGNOSIS — G4700 Insomnia, unspecified: Secondary | ICD-10-CM | POA: Diagnosis present

## 2021-07-21 DIAGNOSIS — I714 Abdominal aortic aneurysm, without rupture, unspecified: Secondary | ICD-10-CM | POA: Diagnosis present

## 2021-07-21 DIAGNOSIS — N400 Enlarged prostate without lower urinary tract symptoms: Secondary | ICD-10-CM | POA: Diagnosis present

## 2021-07-21 DIAGNOSIS — Z9049 Acquired absence of other specified parts of digestive tract: Secondary | ICD-10-CM

## 2021-07-21 DIAGNOSIS — Z79899 Other long term (current) drug therapy: Secondary | ICD-10-CM

## 2021-07-21 DIAGNOSIS — D539 Nutritional anemia, unspecified: Secondary | ICD-10-CM | POA: Diagnosis present

## 2021-07-21 DIAGNOSIS — J1282 Pneumonia due to coronavirus disease 2019: Secondary | ICD-10-CM | POA: Diagnosis present

## 2021-07-21 DIAGNOSIS — I129 Hypertensive chronic kidney disease with stage 1 through stage 4 chronic kidney disease, or unspecified chronic kidney disease: Secondary | ICD-10-CM | POA: Diagnosis present

## 2021-07-21 DIAGNOSIS — R0609 Other forms of dyspnea: Secondary | ICD-10-CM

## 2021-07-21 DIAGNOSIS — I4510 Unspecified right bundle-branch block: Secondary | ICD-10-CM | POA: Diagnosis present

## 2021-07-21 DIAGNOSIS — D696 Thrombocytopenia, unspecified: Secondary | ICD-10-CM | POA: Diagnosis present

## 2021-07-21 DIAGNOSIS — G2581 Restless legs syndrome: Secondary | ICD-10-CM | POA: Diagnosis present

## 2021-07-21 DIAGNOSIS — J9601 Acute respiratory failure with hypoxia: Secondary | ICD-10-CM | POA: Diagnosis present

## 2021-07-21 DIAGNOSIS — N1831 Chronic kidney disease, stage 3a: Secondary | ICD-10-CM | POA: Diagnosis present

## 2021-07-21 DIAGNOSIS — J439 Emphysema, unspecified: Secondary | ICD-10-CM | POA: Diagnosis present

## 2021-07-21 DIAGNOSIS — I480 Paroxysmal atrial fibrillation: Secondary | ICD-10-CM | POA: Diagnosis present

## 2021-07-21 DIAGNOSIS — C8510 Unspecified B-cell lymphoma, unspecified site: Secondary | ICD-10-CM | POA: Diagnosis present

## 2021-07-21 LAB — CBC WITH DIFFERENTIAL/PLATELET
Abs Immature Granulocytes: 0.02 10*3/uL (ref 0.00–0.07)
Basophils Absolute: 0 10*3/uL (ref 0.0–0.1)
Basophils Relative: 1 %
Eosinophils Absolute: 0.1 10*3/uL (ref 0.0–0.5)
Eosinophils Relative: 3 %
HCT: 31.6 % — ABNORMAL LOW (ref 39.0–52.0)
Hemoglobin: 10.4 g/dL — ABNORMAL LOW (ref 13.0–17.0)
Immature Granulocytes: 1 %
Lymphocytes Relative: 14 %
Lymphs Abs: 0.6 10*3/uL — ABNORMAL LOW (ref 0.7–4.0)
MCH: 33.8 pg (ref 26.0–34.0)
MCHC: 32.9 g/dL (ref 30.0–36.0)
MCV: 102.6 fL — ABNORMAL HIGH (ref 80.0–100.0)
Monocytes Absolute: 0.5 10*3/uL (ref 0.1–1.0)
Monocytes Relative: 11 %
Neutro Abs: 2.9 10*3/uL (ref 1.7–7.7)
Neutrophils Relative %: 70 %
Platelets: 119 10*3/uL — ABNORMAL LOW (ref 150–400)
RBC: 3.08 MIL/uL — ABNORMAL LOW (ref 4.22–5.81)
RDW: 13.6 % (ref 11.5–15.5)
WBC: 4.1 10*3/uL (ref 4.0–10.5)
nRBC: 0 % (ref 0.0–0.2)

## 2021-07-21 LAB — COMPREHENSIVE METABOLIC PANEL
ALT: 20 U/L (ref 0–44)
AST: 25 U/L (ref 15–41)
Albumin: 3.7 g/dL (ref 3.5–5.0)
Alkaline Phosphatase: 83 U/L (ref 38–126)
Anion gap: 6 (ref 5–15)
BUN: 24 mg/dL — ABNORMAL HIGH (ref 8–23)
CO2: 23 mmol/L (ref 22–32)
Calcium: 8.6 mg/dL — ABNORMAL LOW (ref 8.9–10.3)
Chloride: 108 mmol/L (ref 98–111)
Creatinine, Ser: 1.18 mg/dL (ref 0.61–1.24)
GFR, Estimated: 59 mL/min — ABNORMAL LOW (ref >60)
Glucose, Bld: 111 mg/dL — ABNORMAL HIGH (ref 70–99)
Potassium: 3.9 mmol/L (ref 3.5–5.1)
Sodium: 137 mmol/L (ref 135–145)
Total Bilirubin: 1.2 mg/dL (ref 0.3–1.2)
Total Protein: 6.2 g/dL — ABNORMAL LOW (ref 6.5–8.1)

## 2021-07-21 LAB — LACTIC ACID, PLASMA: Lactic Acid, Venous: 1.2 mmol/L (ref 0.5–1.9)

## 2021-07-21 LAB — RESP PANEL BY RT-PCR (FLU A&B, COVID) ARPGX2
Influenza A by PCR: NEGATIVE
Influenza B by PCR: NEGATIVE
SARS Coronavirus 2 by RT PCR: POSITIVE — AB

## 2021-07-21 LAB — C-REACTIVE PROTEIN: CRP: 0.7 mg/dL (ref <1.0)

## 2021-07-21 LAB — TRIGLYCERIDES: Triglycerides: 61 mg/dL (ref <150)

## 2021-07-21 LAB — PROTIME-INR
INR: 1.1 (ref 0.8–1.2)
Prothrombin Time: 13.7 seconds (ref 11.4–15.2)

## 2021-07-21 LAB — PROCALCITONIN: Procalcitonin: <0.1 ng/mL

## 2021-07-21 LAB — LACTATE DEHYDROGENASE: LDH: 135 U/L (ref 98–192)

## 2021-07-21 LAB — FERRITIN: Ferritin: 61 ng/mL (ref 24–336)

## 2021-07-21 LAB — FIBRINOGEN: Fibrinogen: 261 mg/dL (ref 210–475)

## 2021-07-21 MED ORDER — ASCORBIC ACID 500 MG PO TABS
500.0000 mg | ORAL_TABLET | Freq: Every day | ORAL | Status: DC
  Administered 2021-07-21 – 2021-07-26 (×6): 500 mg via ORAL
  Filled 2021-07-21 (×6): qty 1

## 2021-07-21 MED ORDER — ASPIRIN EC 81 MG PO TBEC
81.0000 mg | DELAYED_RELEASE_TABLET | Freq: Every day | ORAL | Status: DC
  Administered 2021-07-21 – 2021-07-26 (×6): 81 mg via ORAL
  Filled 2021-07-21 (×6): qty 1

## 2021-07-21 MED ORDER — METHYLPREDNISOLONE SODIUM SUCC 40 MG IJ SOLR
0.5000 mg/kg | Freq: Two times a day (BID) | INTRAMUSCULAR | Status: AC
  Administered 2021-07-21 – 2021-07-24 (×6): 30.8 mg via INTRAVENOUS
  Filled 2021-07-21 (×6): qty 1

## 2021-07-21 MED ORDER — AMLODIPINE BESYLATE 10 MG PO TABS
10.0000 mg | ORAL_TABLET | Freq: Every day | ORAL | Status: DC
  Administered 2021-07-21 – 2021-07-26 (×6): 10 mg via ORAL
  Filled 2021-07-21 (×6): qty 1

## 2021-07-21 MED ORDER — ACETAMINOPHEN 325 MG PO TABS
650.0000 mg | ORAL_TABLET | Freq: Four times a day (QID) | ORAL | Status: DC | PRN
  Administered 2021-07-21: 17:00:00 650 mg via ORAL
  Filled 2021-07-21: qty 2

## 2021-07-21 MED ORDER — ZINC SULFATE 220 (50 ZN) MG PO CAPS
220.0000 mg | ORAL_CAPSULE | Freq: Every day | ORAL | Status: DC
  Administered 2021-07-21 – 2021-07-26 (×6): 220 mg via ORAL
  Filled 2021-07-21 (×6): qty 1

## 2021-07-21 MED ORDER — ONDANSETRON HCL 4 MG/2ML IJ SOLN
4.0000 mg | Freq: Once | INTRAMUSCULAR | Status: AC
  Administered 2021-07-21: 07:00:00 4 mg via INTRAVENOUS
  Filled 2021-07-21: qty 2

## 2021-07-21 MED ORDER — AMIODARONE HCL 200 MG PO TABS
300.0000 mg | ORAL_TABLET | Freq: Every day | ORAL | Status: DC
  Administered 2021-07-21 – 2021-07-26 (×6): 300 mg via ORAL
  Filled 2021-07-21 (×6): qty 1

## 2021-07-21 MED ORDER — VITAMIN B-12 1000 MCG PO TABS
1000.0000 ug | ORAL_TABLET | Freq: Every day | ORAL | Status: DC
  Administered 2021-07-21 – 2021-07-26 (×6): 1000 ug via ORAL
  Filled 2021-07-21 (×6): qty 1

## 2021-07-21 MED ORDER — OMEGA-3-ACID ETHYL ESTERS 1 G PO CAPS
1.0000 g | ORAL_CAPSULE | Freq: Every day | ORAL | Status: DC
  Administered 2021-07-22 – 2021-07-26 (×5): 1 g via ORAL
  Filled 2021-07-21 (×5): qty 1

## 2021-07-21 MED ORDER — GUAIFENESIN-DM 100-10 MG/5ML PO SYRP
10.0000 mL | ORAL_SOLUTION | ORAL | Status: DC | PRN
  Administered 2021-07-21: 17:00:00 10 mL via ORAL
  Filled 2021-07-21 (×2): qty 10

## 2021-07-21 MED ORDER — VITAMIN D 25 MCG (1000 UNIT) PO TABS
1000.0000 [IU] | ORAL_TABLET | Freq: Every day | ORAL | Status: DC
  Administered 2021-07-21 – 2021-07-26 (×6): 1000 [IU] via ORAL
  Filled 2021-07-21 (×6): qty 1

## 2021-07-21 MED ORDER — IPRATROPIUM-ALBUTEROL 20-100 MCG/ACT IN AERS
1.0000 | INHALATION_SPRAY | Freq: Four times a day (QID) | RESPIRATORY_TRACT | Status: DC
  Administered 2021-07-22 – 2021-07-25 (×13): 1 via RESPIRATORY_TRACT
  Filled 2021-07-21 (×2): qty 4

## 2021-07-21 MED ORDER — ROPINIROLE HCL 1 MG PO TABS: 1.0000 mg | ORAL_TABLET | Freq: Every day | ORAL | Status: DC | PRN

## 2021-07-21 MED ORDER — PREDNISONE 50 MG PO TABS
50.0000 mg | ORAL_TABLET | Freq: Every day | ORAL | Status: DC
  Administered 2021-07-25 – 2021-07-26 (×2): 50 mg via ORAL
  Filled 2021-07-21 (×3): qty 1

## 2021-07-21 MED ORDER — SODIUM CHLORIDE 0.9 % IV SOLN
200.0000 mg | Freq: Once | INTRAVENOUS | Status: AC
  Administered 2021-07-21: 09:00:00 200 mg via INTRAVENOUS
  Filled 2021-07-21: qty 40

## 2021-07-21 MED ORDER — TAMSULOSIN HCL 0.4 MG PO CAPS: 0.4000 mg | ORAL_CAPSULE | Freq: Every day | ORAL | Status: DC | PRN

## 2021-07-21 MED ORDER — DEXAMETHASONE SODIUM PHOSPHATE 10 MG/ML IJ SOLN
6.0000 mg | Freq: Once | INTRAMUSCULAR | Status: AC
  Administered 2021-07-21: 07:00:00 6 mg via INTRAVENOUS
  Filled 2021-07-21: qty 1

## 2021-07-21 MED ORDER — IPRATROPIUM-ALBUTEROL 0.5-2.5 (3) MG/3ML IN SOLN
3.0000 mL | Freq: Once | RESPIRATORY_TRACT | Status: AC
  Administered 2021-07-21: 07:00:00 3 mL via RESPIRATORY_TRACT
  Filled 2021-07-21: qty 3

## 2021-07-21 MED ORDER — ALPRAZOLAM 0.5 MG PO TABS
0.5000 mg | ORAL_TABLET | Freq: Every evening | ORAL | Status: DC | PRN
  Administered 2021-07-21 – 2021-07-24 (×4): 0.5 mg via ORAL
  Filled 2021-07-21 (×4): qty 1

## 2021-07-21 MED ORDER — SODIUM CHLORIDE 0.9 % IV BOLUS
1000.0000 mL | Freq: Once | INTRAVENOUS | Status: AC
  Administered 2021-07-21: 05:00:00 1000 mL via INTRAVENOUS

## 2021-07-21 MED ORDER — SODIUM CHLORIDE 0.9 % IV SOLN
100.0000 mg | Freq: Every day | INTRAVENOUS | Status: AC
  Administered 2021-07-22 – 2021-07-25 (×4): 100 mg via INTRAVENOUS
  Filled 2021-07-21 (×4): qty 20

## 2021-07-21 NOTE — ED Notes (Signed)
Pt's O2 sats noted to be in the 80's on RA.  2L supplemental O2 placed on pt w/ increase in O2 sat to 93%.

## 2021-07-21 NOTE — ED Provider Notes (Signed)
[facility] Emergency Department Provider Note MRN:  [id number]  Arrival date & time: 07/21/21     Chief Complaint   Cough and Respiratory Distress   History of Present Illness   Thomas Doyle is a 87 y.o. year-old male with a history of AAA, COPD, B-cell lymphoma presenting to the ED with chief complaint of cough.  Cough and trouble breathing through the nose for the past 2 or 3 days.  Tested positive for COVID-19.  Feeling general malaise, fatigue.  Mild dyspnea, no chest pain, no abdominal pain.  Some nausea vomiting and diarrhea noted.  Otherwise feels well.  Symptoms are constant, mild to moderate, no exacerbating or alleviating factors.  Review of Systems  A complete 10 system review of systems was obtained and all systems are negative except as noted in the HPI and PMH.   Patient's Health History    Past Medical History:  Diagnosis Date   AAA (abdominal aortic aneurysm) ([facility])    intrarenal   Anemia    COPD (chronic obstructive pulmonary disease) ([place]) dx April 2013   Hypertension    Large B-cell lymphoma ([place]) 11/2011   Mitral valve prolapse    Non Hodgkin's lymphoma (HCC)    PAF (paroxysmal atrial fibrillation) (HCC)    On amiodarone, digoxin. Not on coumadin   Pneumonia April 2013   Right bundle branch block (RBBB)    Sick sinus syndrome [facility])     Past Surgical History:  Procedure Laterality Date   abdominal aorta aneurysm duplex  11/20/2012   normal - demonstrates normal taper   CARDIAC CATHETERIZATION  09/02/2010   normal LV function, normal coronary arteries, irregularity of the distal aorta with a small aneursymal dilatation   CARDIOVASCULAR STRESS TEST  08/20/2010   evidence of mild-moderate ischemia in Basal inferoseptal, Basal inferior, Mid inferoseptal and Mid inferior regions. EKG negative for ischemia. Patient developed RBBB during exercise and transient ventricular bigeminal rhythm   CHEST TUBE INSERTION  1990's   fell  thru deck,  punctured lung, chest tube   CHOLECYSTECTOMY  1980's   CIRCUMCISION  age 10   DOPPLER ECHOCARDIOGRAPHY  04/05/2012   EF 63%, borderline MVP with mild mitral regurg   TONSILLECTOMY      Family History  Problem Relation Age of Onset   Atrial fibrillation Mother    Cancer Father    Heart attack Father     Social History   Socioeconomic History   Marital status: Widowed    Spouse name: Not on file   Number of children: 2   Years of education: 14   Highest education level: Not on file  Occupational History   Occupation: Retired from [employer] Use   Smoking status: Never   Smokeless tobacco: Never  Substance and Sexual Activity   Alcohol use: No   Drug use: No   Sexual activity: Not on file  Other Topics Concern   Not on file  Social History Narrative   Widowed x 3 years.  Lives alone.  Independent of ADLs and ambulation.  Emergency Contact: Lesly Joslyn ([place]): [phone]. Has two daughters. Still living at home, doesn't use cane/walker.             Social Determinants of Health   Financial Resource Strain: Not on file  Food Insecurity: Not on file  Transportation Needs: Not on file  Physical Activity: Not on file  Stress: Not on file  Social Connections: Not on file  Intimate Partner Violence:  Not on file     Physical Exam   Vitals:   07/21/21 0530 07/21/21 0600  BP: (!) 109/42 (!) 140/45  Pulse: (!) 58 (!) 58  Resp: 20 20  Temp:    SpO2: 95% 95%    CONSTITUTIONAL: Well-appearing, NAD NEURO:  Alert and oriented x 3, no focal deficits EYES:  eyes equal and reactive ENT/NECK:  no LAD, no JVD CARDIO: Regular rate, well-perfused, normal S1 and S2 PULM:  CTAB no wheezing or rhonchi GI/GU:  normal bowel sounds, non-distended, non-tender MSK/SPINE:  No gross deformities, no edema SKIN:  no rash, atraumatic PSYCH:  Appropriate speech and behavior  *Additional and/or pertinent findings included in MDM below  Diagnostic and Interventional Summary    EKG  Interpretation  Date/Time:  Wednesday July 21 2021 02:49:30 EST Ventricular Rate:  53 PR Interval:  183 QRS Duration: 162 QT Interval:  518 QTC Calculation: 487 R Axis:   -72 Text Interpretation: Sinus rhythm Atrial premature complex RBBB and LAFB Confirmed by Gerlene Fee [phone]) on 07/21/2021 3:11:56 AM       Labs Reviewed  RESP PANEL BY RT-PCR (FLU A&B, COVID) ARPGX2 - Abnormal; Notable for the following components:      Result Value   SARS Coronavirus 2 by RT PCR POSITIVE (*)    All other components within normal limits  CBC WITH DIFFERENTIAL/PLATELET - Abnormal; Notable for the following components:   RBC 3.08 (*)    Hemoglobin 10.4 (*)    HCT 31.6 (*)    MCV 102.6 (*)    Platelets 119 (*)    Lymphs Abs 0.6 (*)    All other components within normal limits  COMPREHENSIVE METABOLIC PANEL - Abnormal; Notable for the following components:   Glucose, Bld 111 (*)    BUN 24 (*)    Calcium 8.6 (*)    Total Protein 6.2 (*)    GFR, Estimated 59 (*)    All other components within normal limits  CULTURE, BLOOD (SINGLE)  LACTIC ACID, PLASMA  PROCALCITONIN  LACTATE DEHYDROGENASE  FERRITIN  TRIGLYCERIDES  FIBRINOGEN  PROTIME-INR  LACTIC ACID, PLASMA  C-REACTIVE PROTEIN    DG Chest Port 1 View  Final Result      Medications  ipratropium-albuterol (DUONEB) 0.5-2.5 (3) MG/3ML nebulizer solution 3 mL (has no administration in time range)  dexamethasone (DECADRON) injection 6 mg (has no administration in time range)  sodium chloride 0.9 % bolus 1,000 mL (0 mLs Intravenous Stopped 07/21/21 0263)     Procedures  /  Critical Care Procedures  ED Course and Medical Decision Making  I have reviewed the triage vital signs, the nursing notes, and pertinent available records from the EMR.  Listed above are laboratory and imaging tests that I personally ordered, reviewed, and interpreted and then considered in my medical decision making (see below for details).  Suspected  viral illness, likely COVID-19.  Awaiting EKG and chest x-ray as screening diagnostics.  Lungs are overall clear, patient is not having any chest pain, patient has no increased work of breathing, laying comfortably, conversant, pleasant to talk to.  Anticipating discharge if able to ambulate without hypoxia.     Patient very symptomatic with simple positional change in bed, cough and winded, pulse ox down to 90% during this period seems that he would definitely fail a walk test.  Accepted for admission by hospitalist service.  Barth Kirks. Sedonia Small, [place] [email or web address]  Final Clinical Impressions(s) / ED  Diagnoses     ICD-10-CM   1. COVID-19  U07.1     2. SOB (shortness of breath)  R06.02 DG Chest [facility] 1 View    DG Chest [facility] 1 View    3. Dyspnea on exertion  R06.09       ED Discharge Orders     None        Discharge Instructions Discussed with and Provided to Patient:   Discharge Instructions   None       Maudie Flakes, MD 07/21/21 [phone]

## 2021-07-21 NOTE — Plan of Care (Signed)
  Problem: Education: Goal: Knowledge of General Education information will improve Description Including pain rating scale, medication(s)/side effects and non-pharmacologic comfort measures Outcome: Progressing   

## 2021-07-21 NOTE — ED Notes (Signed)
Pt became increasingly SOB with positional changes in bed - EDP notified.

## 2021-07-21 NOTE — Plan of Care (Signed)
Call button and Personal belongings were placed within reach. Pt will call for assistance when getting out of the bed. Pt hand book provided.

## 2021-07-21 NOTE — ED Triage Notes (Signed)
Per EMS, patient's daughter was sick on Christmas day and came to see him. He then became sick. Per EMS, he did home Covid test and it was positive on the 26th. He has general malaise, cough and shortness of breath. Lung sounds are dimished per EMS. He is A&Ox4.

## 2021-07-21 NOTE — H&P (Signed)
History and Physical    Thomas Doyle YWV:[id number] DOB: 1928/09/06 DOA: 07/21/2021  PCP: Leeroy Cha, MD  Patient coming from: Home  Chief Complaint: sore throat and cough  HPI: Thomas Doyle is a 87 y.o. male with medical history significant of Large B-cell lymphoma, CKD3a, PAF, COPD. Presenting with sore throat and cough. His symptoms started 3 days ago. He had sore throat and cough. He tried some OTC meds, but they didn't help. He then started having shortness of breath, N/V and a headache. He took a home test for COVID and was positive. His symptoms worsened through last night, so he decided to come to the ED for help. He denies any other aggravating or alleviating factors.     ED Course: He was COVID positive. He was started on remdes and decadron. [facility] was called for admission.   Review of Systems:  Denies CP, abdominal pain, lightheadedness, dizziness, fevers, chills. Review of systems is otherwise negative for all not mentioned in HPI.   PMHx Past Medical History:  Diagnosis Date   AAA (abdominal aortic aneurysm) ([place])    intrarenal   Anemia    COPD (chronic obstructive pulmonary disease) ([place]) dx April 2013   Hypertension    Large B-cell lymphoma ([place]) 11/2011   Mitral valve prolapse    Non Hodgkin's lymphoma (HCC)    PAF (paroxysmal atrial fibrillation) (HCC)    On amiodarone, digoxin. Not on coumadin   Pneumonia April 2013   Right bundle branch block (RBBB)    Sick sinus syndrome (HCC)     PSHx Past Surgical History:  Procedure Laterality Date   abdominal aorta aneurysm duplex  11/20/2012   normal - demonstrates normal taper   CARDIAC CATHETERIZATION  09/02/2010   normal LV function, normal coronary arteries, irregularity of the distal aorta with a small aneursymal dilatation   CARDIOVASCULAR STRESS TEST  08/20/2010   evidence of mild-moderate ischemia in Basal inferoseptal, Basal inferior, Mid inferoseptal and Mid inferior regions. EKG negative for  ischemia. Patient developed RBBB during exercise and transient ventricular bigeminal rhythm   CHEST TUBE INSERTION  1990's   fell  thru deck, punctured lung, chest tube   CHOLECYSTECTOMY  1980's   CIRCUMCISION  age 59   DOPPLER ECHOCARDIOGRAPHY  04/05/2012   EF 63%, borderline MVP with mild mitral regurg   TONSILLECTOMY      SocHx  reports that he has never smoked. He has never used smokeless tobacco. He reports that he does not drink alcohol and does not use drugs.  Allergies  Allergen Reactions   Codeine Nausea Only    FamHx Family History  Problem Relation Age of Onset   Atrial fibrillation Mother    Cancer Father    Heart attack Father     Prior to Admission medications   Medication Sig Start Date End Date Taking? Authorizing Provider  amiodarone (PACERONE) 200 MG tablet TAKE 1 AND 1/2 TABLETS BY MOUTH DAILY 09/07/20   Troy Sine, MD  amLODipine (NORVASC) 10 MG tablet Take 1 tablet (10 mg total) by mouth daily. 07/12/21   Troy Sine, MD  aspirin 81 MG tablet Take 81 mg by mouth daily.    [provider]  cholecalciferol (VITAMIN D3) 25 MCG (1000 UT) tablet Take 1,000 Units by mouth daily.    [provider]  metoprolol succinate (TOPROL-XL) 25 MG 24 hr tablet Take 1 tablet (25 mg total) by mouth daily. Please schedule yearly appointment for future refills. Thank you  11/16/20   Almyra Deforest, PA  omega-3 acid ethyl esters (LOVAZA) 1 g capsule TAKE ONE CAPSULE BY MOUTH DAILY WITH FOOD 09/07/20   Troy Sine, MD  rOPINIRole (REQUIP) 1 MG tablet Take 1 mg by mouth daily as needed (for restless legs).     [provider]  tamsulosin (FLOMAX) 0.4 MG CAPS capsule Take 0.4 mg by mouth daily as needed (pt preference).  11/11/17   [provider]  vitamin B-12 (CYANOCOBALAMIN) 1000 MCG tablet Take 1,000 mcg by mouth daily.    [provider]  zolpidem (AMBIEN) 5 MG tablet Take 1 tablet (5 mg total) by mouth at bedtime as needed. for  sleep Patient not taking: Reported on 05/04/2020 10/20/17   Troy Sine, MD    Physical Exam: Vitals:   07/21/21 0500 07/21/21 0530 07/21/21 0600 07/21/21 0710  BP: (!) 125/43 (!) 109/42 (!) 140/45   Pulse: (!) 58 (!) 58 (!) 58 70  Resp: 20 20 20 19   Temp:      TempSrc:      SpO2: 95% 95% 95% 93%  Weight:      Height:        General: 87 y.o. male resting in bed in NAD Eyes: PERRL, normal sclera ENMT: Nares patent w/o discharge, orophaynx clear, dentition normal, ears w/o discharge/lesions/ulcers Neck: Supple, trachea midline Cardiovascular: RRR, +S1, S2, no m/g/r, equal pulses throughout Respiratory: scattered wheeze, slight increased WOB GI: BS+, NDNT, no masses noted, no organomegaly noted MSK: No e/c/c Neuro: A&O x 3, no focal deficits Psyc: Appropriate interaction and affect, calm/cooperative  Labs on Admission: I have personally reviewed following labs and imaging studies  CBC: Recent Labs  Lab 07/21/21 0220  WBC 4.1  NEUTROABS 2.9  HGB 10.4*  HCT 31.6*  MCV 102.6*  PLT 194*   Basic Metabolic Panel: Recent Labs  Lab 07/21/21 0220  NA 137  K 3.9  CL 108  CO2 23  GLUCOSE 111*  BUN 24*  CREATININE 1.18  CALCIUM 8.6*   GFR: Estimated Creatinine Clearance: 36 mL/min (by C-G formula based on SCr of 1.18 mg/dL). Liver Function Tests: Recent Labs  Lab 07/21/21 0220  AST 25  ALT 20  ALKPHOS 83  BILITOT 1.2  PROT 6.2*  ALBUMIN 3.7   No results for input(s): LIPASE, AMYLASE in the last 168 hours. No results for input(s): AMMONIA in the last 168 hours. Coagulation Profile: Recent Labs  Lab 07/21/21 0220  INR 1.1   Cardiac Enzymes: No results for input(s): CKTOTAL, CKMB, CKMBINDEX, TROPONINI in the last 168 hours. BNP (last 3 results) No results for input(s): PROBNP in the last 8760 hours. HbA1C: No results for input(s): HGBA1C in the last 72 hours. CBG: No results for input(s): GLUCAP in the last 168 hours. Lipid Profile: Recent Labs     07/21/21 0319  TRIG 61   Thyroid Function Tests: No results for input(s): TSH, T4TOTAL, FREET4, T3FREE, THYROIDAB in the last 72 hours. Anemia Panel: Recent Labs    07/21/21 0319  FERRITIN 61   Urine analysis:    Component Value Date/Time   COLORURINE YELLOW 12/27/2016 1820   APPEARANCEUR CLEAR 12/27/2016 1820   LABSPEC 1.018 12/27/2016 1820   PHURINE 5.0 12/27/2016 1820   GLUCOSEU NEGATIVE 12/27/2016 1820   HGBUR NEGATIVE 12/27/2016 1820   BILIRUBINUR NEGATIVE 12/27/2016 1820   KETONESUR 20 (A) 12/27/2016 1820   PROTEINUR NEGATIVE 12/27/2016 1820   UROBILINOGEN 0.2 12/08/2011 2020   NITRITE NEGATIVE 12/27/2016 1820  LEUKOCYTESUR NEGATIVE 12/27/2016 1820    Radiological Exams on Admission: DG Chest Port 1 View  Result Date: 07/21/2021 CLINICAL DATA:  Shortness of breath and cough. EXAM: PORTABLE CHEST 1 VIEW COMPARISON:  Chest radiograph dated 05/04/2020. FINDINGS: Background of emphysema. Right apical subpleural scarring. Bibasilar densities, likely atelectasis/scarring. Developing infiltrate is less likely. No focal consolidation, pleural effusion, pneumothorax. The cardiac silhouette is within normal limits. No acute osseous pathology. IMPRESSION: 1. No focal consolidation. 2. Emphysema. Electronically Signed   By: Anner Crete M.D.   On: 07/21/2021 02:40    EKG: Independently reviewed. Sinus, RBBB  Assessment/Plan COVID 19 Dyspnea     - admit to inpt, tele     - continue remdes, steroids; add combivent, guaifenesin, IS/FV     - wean O2 as able     - follow inflammatory markers     - check CT chest  Hx of COPD     - continue steroids, combivent     - wean O2 as able  HTN     - continue home regimen  BPH     - continue home regimen  Restless legs     - continue home regimen  Paroxysmal A fib; not on anticoagulation     - continue home regimen  Large B-cell lymphoma     - continue outpt follow up  CKD3a     - he is at baseline, follow AM  labs  Thrombocytopenia Macrocytic anemia     - no evidence of bleed, follow  DVT prophylaxis: SCDs Code Status: DNR  Family Communication: None at bedside  Consults called: None   Status is: Inpatient  Remains inpatient appropriate because: severity of illness  Jonnie Finner DO [facility]  If 7PM-7AM, please contact night-coverage [email or web address]  07/21/2021, 7:38 AM

## 2021-07-21 NOTE — ED Notes (Signed)
Save tubes sent to lab:  3x gold 1x blue 1x lavender 1x light green 1x dark green 1x grey on ice

## 2021-07-22 LAB — CBC WITH DIFFERENTIAL/PLATELET
Abs Immature Granulocytes: 0.02 10*3/uL (ref 0.00–0.07)
Basophils Absolute: 0 10*3/uL (ref 0.0–0.1)
Basophils Relative: 0 %
Eosinophils Absolute: 0 10*3/uL (ref 0.0–0.5)
Eosinophils Relative: 0 %
HCT: 30.4 % — ABNORMAL LOW (ref 39.0–52.0)
Hemoglobin: 10.3 g/dL — ABNORMAL LOW (ref 13.0–17.0)
Immature Granulocytes: 1 %
Lymphocytes Relative: 13 %
Lymphs Abs: 0.5 10*3/uL — ABNORMAL LOW (ref 0.7–4.0)
MCH: 33.8 pg (ref 26.0–34.0)
MCHC: 33.9 g/dL (ref 30.0–36.0)
MCV: 99.7 fL (ref 80.0–100.0)
Monocytes Absolute: 0.1 10*3/uL (ref 0.1–1.0)
Monocytes Relative: 3 %
Neutro Abs: 2.8 10*3/uL (ref 1.7–7.7)
Neutrophils Relative %: 83 %
Platelets: 122 10*3/uL — ABNORMAL LOW (ref 150–400)
RBC: 3.05 MIL/uL — ABNORMAL LOW (ref 4.22–5.81)
RDW: 13.3 % (ref 11.5–15.5)
WBC: 3.4 10*3/uL — ABNORMAL LOW (ref 4.0–10.5)
nRBC: 0 % (ref 0.0–0.2)

## 2021-07-22 LAB — BLOOD CULTURE ID PANEL (REFLEXED) - BCID2

## 2021-07-22 LAB — COMPREHENSIVE METABOLIC PANEL
ALT: 79 U/L — ABNORMAL HIGH (ref 0–44)
AST: 85 U/L — ABNORMAL HIGH (ref 15–41)
Albumin: 3.4 g/dL — ABNORMAL LOW (ref 3.5–5.0)
Alkaline Phosphatase: 82 U/L (ref 38–126)
Anion gap: 5 (ref 5–15)
BUN: 22 mg/dL (ref 8–23)
CO2: 24 mmol/L (ref 22–32)
Calcium: 8.7 mg/dL — ABNORMAL LOW (ref 8.9–10.3)
Chloride: 107 mmol/L (ref 98–111)
Creatinine, Ser: 0.92 mg/dL (ref 0.61–1.24)
GFR, Estimated: >60 mL/min (ref >60)
Glucose, Bld: 144 mg/dL — ABNORMAL HIGH (ref 70–99)
Potassium: 4.2 mmol/L (ref 3.5–5.1)
Sodium: 136 mmol/L (ref 135–145)
Total Bilirubin: 1 mg/dL (ref 0.3–1.2)
Total Protein: 5.8 g/dL — ABNORMAL LOW (ref 6.5–8.1)

## 2021-07-22 LAB — C-REACTIVE PROTEIN: CRP: 3.4 mg/dL — ABNORMAL HIGH (ref <1.0)

## 2021-07-22 LAB — EXPECTORATED SPUTUM ASSESSMENT W GRAM STAIN, RFLX TO RESP C

## 2021-07-22 LAB — D-DIMER, QUANTITATIVE: D-Dimer, Quant: 0.49 ug/mL-FEU (ref 0.00–0.50)

## 2021-07-22 LAB — MRSA NEXT GEN BY PCR, NASAL: MRSA by PCR Next Gen: NOT DETECTED

## 2021-07-22 MED ORDER — ENOXAPARIN SODIUM 40 MG/0.4ML IJ SOSY
40.0000 mg | PREFILLED_SYRINGE | Freq: Every day | INTRAMUSCULAR | Status: DC
  Administered 2021-07-22 – 2021-07-26 (×5): 40 mg via SUBCUTANEOUS
  Filled 2021-07-22 (×5): qty 0.4

## 2021-07-22 MED ORDER — SODIUM CHLORIDE 0.9 % IV SOLN
1.0000 g | INTRAVENOUS | Status: DC
  Administered 2021-07-22: 20:00:00 1 g via INTRAVENOUS
  Filled 2021-07-22: qty 10

## 2021-07-22 MED ORDER — BENZONATATE 100 MG PO CAPS
200.0000 mg | ORAL_CAPSULE | Freq: Three times a day (TID) | ORAL | Status: DC
  Administered 2021-07-22 – 2021-07-24 (×5): 200 mg via ORAL
  Filled 2021-07-22 (×5): qty 2

## 2021-07-22 MED ORDER — FUROSEMIDE 10 MG/ML IJ SOLN
20.0000 mg | Freq: Once | INTRAMUSCULAR | Status: AC
Start: 2021-07-22 — End: 2021-07-22
  Administered 2021-07-22: 19:00:00 20 mg via INTRAVENOUS
  Filled 2021-07-22: qty 2

## 2021-07-22 NOTE — Plan of Care (Signed)
  Problem: Education: Goal: Knowledge of General Education information will improve Description Including pain rating scale, medication(s)/side effects and non-pharmacologic comfort measures Outcome: Progressing   

## 2021-07-22 NOTE — Progress Notes (Signed)
PHARMACY - PHYSICIAN COMMUNICATION CRITICAL VALUE ALERT - BLOOD CULTURE IDENTIFICATION (BCID)  Thomas Doyle is an 87 y.o. male who presented to [facility] on 07/21/2021 with a chief complaint of sore throat and cough  Assessment:  GPC aerobic, 1/2 staph species and strep species  Name of physician (or Provider) Contacted: Clarene Essex  Current antibiotics: none  Changes to prescribed antibiotics recommended:  Probable contaminant  Results for orders placed or performed during the hospital encounter of 07/21/21  Blood Culture ID Panel (Reflexed) (Collected: 07/21/2021  3:20 AM)  Result Value Ref Range   Enterococcus faecalis NOT DETECTED NOT DETECTED   Enterococcus Faecium NOT DETECTED NOT DETECTED   Listeria monocytogenes NOT DETECTED NOT DETECTED   Staphylococcus species DETECTED (A) NOT DETECTED   Staphylococcus aureus (BCID) NOT DETECTED NOT DETECTED   Staphylococcus epidermidis NOT DETECTED NOT DETECTED   Staphylococcus lugdunensis NOT DETECTED NOT DETECTED   Streptococcus species DETECTED (A) NOT DETECTED   Streptococcus agalactiae NOT DETECTED NOT DETECTED   Streptococcus pneumoniae NOT DETECTED NOT DETECTED   Streptococcus pyogenes NOT DETECTED NOT DETECTED   A.calcoaceticus-baumannii NOT DETECTED NOT DETECTED   Bacteroides fragilis NOT DETECTED NOT DETECTED   Enterobacterales NOT DETECTED NOT DETECTED   Enterobacter cloacae complex NOT DETECTED NOT DETECTED   Escherichia coli NOT DETECTED NOT DETECTED   Klebsiella aerogenes NOT DETECTED NOT DETECTED   Klebsiella oxytoca NOT DETECTED NOT DETECTED   Klebsiella pneumoniae NOT DETECTED NOT DETECTED   Proteus species NOT DETECTED NOT DETECTED   Salmonella species NOT DETECTED NOT DETECTED   Serratia marcescens NOT DETECTED NOT DETECTED   Haemophilus influenzae NOT DETECTED NOT DETECTED   Neisseria meningitidis NOT DETECTED NOT DETECTED   Pseudomonas aeruginosa NOT DETECTED NOT DETECTED   Stenotrophomonas maltophilia NOT  DETECTED NOT DETECTED   Candida albicans NOT DETECTED NOT DETECTED   Candida auris NOT DETECTED NOT DETECTED   Candida glabrata NOT DETECTED NOT DETECTED   Candida krusei NOT DETECTED NOT DETECTED   Candida parapsilosis NOT DETECTED NOT DETECTED   Candida tropicalis NOT DETECTED NOT DETECTED   Cryptococcus neoformans/gattii NOT DETECTED NOT DETECTED    Dolly Rias RPh 07/22/2021, 3:49 AM

## 2021-07-22 NOTE — Progress Notes (Signed)
PROGRESS NOTE  Thomas Doyle FHL:[id number] DOB: [birth year] DOA: 07/21/2021 PCP: Leeroy Cha, MD  HPI/Recap of past 24 hours: Thomas Doyle is a 87 y.o. male with medical history significant of Large B-cell lymphoma, CKD3a, PAF not on anticoagulation, COPD. Presenting with sore throat and a productive cough. His symptoms started 3 days prior to presentation.  He tried some OTC meds, but they didn't help. He then started having shortness of breath, N/V and a headache. He took a home test for COVID and was positive. His symptoms worsened, so he decided to come to the ED for further evaluation management.  Started on antiviral, remdesivir, bronchodilators, and IV Solu-Medrol.  07/22/2021: Patient was seen and examined at his bedside.  He reports productive cough, not improved.  Will obtain MRSA screening test.  Due to concern for bacterial coinfection, IV antibiotics were started.   Assessment/Plan: Principal Problem:   COVID-19  COVID-19 viral infection, POA, concern for bacterial coinfection Positive COVID-19 screening test on 07/21/2021 Obtain MRSA screening test, sputum culture. Procalcitonin level in the morning Continue antiviral remdesivir Start Rocephin empirically Bronchodilators Pulmonary toilet Antitussives Vitamin C, D3, zinc  Acute hypoxic respiratory failure secondary to above Not on oxygen supplementation at baseline Wean off oxygen supplementation as tolerated. Maintaining oxygen saturation greater than 90% on 2 L nasal cannula.  History of COPD/emphysema No prior history of tobacco use Management as stated above.  Paroxysmal A. fib not on anticoagulation prior to admission Rate is currently controlled Continue home amiodarone Continue to monitor on telemetry  Generalized weakness/physical debility PT OT to assess Fall precautions   Code Status: DNR  Family Communication: None at bedside  Disposition Plan: Likely will discharge to home  once oxygen requirement is close to baseline   Consultants: None  Procedures: None  Antimicrobials: Antiviral, remdesivir Rocephin  DVT prophylaxis: Subcu Lovenox daily  Status is: Inpatient  Patient requires at least 2 midnights for her evaluation and treatment of present condition.      Objective: Vitals:   07/21/21 1600 07/21/21 1617 07/21/21 2102 07/22/21 0938  BP:  (!) 147/65 (!) 129/54 134/62  Pulse:  68 (!) 58   Resp:  18 20   Temp:  99 F (37.2 C) 98.1 F (36.7 C)   TempSrc:      SpO2:  100% 99%   Weight: 61.2 kg     Height: 5\' 10"  (1.778 m)       Intake/Output Summary (Last 24 hours) at 07/22/2021 1753 Last data filed at 07/22/2021 1500 Gross per 24 hour  Intake 600 ml  Output 875 ml  Net -275 ml   Filed Weights   07/21/21 0216 07/21/21 1600  Weight: 61.2 kg 61.2 kg    Exam:  General: 87 y.o. year-old male well developed well nourished in no acute distress.  Alert and oriented x3. Cardiovascular: Regular rate and rhythm with no rubs or gallops.  No thyromegaly or JVD noted.   Respiratory: Diffuse rhonchorous sounds and wheezing.  Good inspiratory effort. Abdomen: Soft nontender nondistended with normal bowel sounds x4 quadrants. Musculoskeletal: Trace lower extremity edema. 2/4 pulses in all 4 extremities. Skin: No ulcerative lesions noted or rashes, Psychiatry: Mood is appropriate for condition and setting   Data Reviewed: CBC: Recent Labs  Lab 07/21/21 0220 07/22/21 0401  WBC 4.1 3.4*  NEUTROABS 2.9 2.8  HGB 10.4* 10.3*  HCT 31.6* 30.4*  MCV 102.6* 99.7  PLT 119* 373*   Basic Metabolic Panel: Recent Labs  Lab 07/21/21 0220  07/22/21 0401  NA 137 136  K 3.9 4.2  CL 108 107  CO2 23 24  GLUCOSE 111* 144*  BUN 24* 22  CREATININE 1.18 0.92  CALCIUM 8.6* 8.7*   GFR: Estimated Creatinine Clearance: 46.2 mL/min (by C-G formula based on SCr of 0.92 mg/dL). Liver Function Tests: Recent Labs  Lab 07/21/21 0220 07/22/21 0401   AST 25 85*  ALT 20 79*  ALKPHOS 83 82  BILITOT 1.2 1.0  PROT 6.2* 5.8*  ALBUMIN 3.7 3.4*   No results for input(s): LIPASE, AMYLASE in the last 168 hours. No results for input(s): AMMONIA in the last 168 hours. Coagulation Profile: Recent Labs  Lab 07/21/21 0220  INR 1.1   Cardiac Enzymes: No results for input(s): CKTOTAL, CKMB, CKMBINDEX, TROPONINI in the last 168 hours. BNP (last 3 results) No results for input(s): PROBNP in the last 8760 hours. HbA1C: No results for input(s): HGBA1C in the last 72 hours. CBG: No results for input(s): GLUCAP in the last 168 hours. Lipid Profile: Recent Labs    07/21/21 0319  TRIG 61   Thyroid Function Tests: No results for input(s): TSH, T4TOTAL, FREET4, T3FREE, THYROIDAB in the last 72 hours. Anemia Panel: Recent Labs    07/21/21 0319  FERRITIN 61   Urine analysis:    Component Value Date/Time   COLORURINE YELLOW 12/27/2016 1820   APPEARANCEUR CLEAR 12/27/2016 1820   LABSPEC 1.018 12/27/2016 1820   PHURINE 5.0 12/27/2016 1820   GLUCOSEU NEGATIVE 12/27/2016 1820   HGBUR NEGATIVE 12/27/2016 1820   BILIRUBINUR NEGATIVE 12/27/2016 1820   KETONESUR 20 (A) 12/27/2016 1820   PROTEINUR NEGATIVE 12/27/2016 1820   UROBILINOGEN 0.2 12/08/2011 2020   NITRITE NEGATIVE 12/27/2016 1820   LEUKOCYTESUR NEGATIVE 12/27/2016 1820   Sepsis Labs: @LABRCNTIP (procalcitonin:4,lacticidven:4)  ) Recent Results (from the past 240 hour(s))  Resp Panel by RT-PCR (Flu A&B, Covid) Nasopharyngeal Swab     Status: Abnormal   Collection Time: 07/21/21  2:03 AM   Specimen: Nasopharyngeal Swab; Nasopharyngeal(NP) swabs in vial transport medium  Result Value Ref Range Status   SARS Coronavirus 2 by RT PCR POSITIVE (A) NEGATIVE Final    Comment: (NOTE) SARS-CoV-2 target nucleic acids are DETECTED.  The SARS-CoV-2 RNA is generally detectable in upper respiratory specimens during the acute phase of infection. Positive results are indicative of the  presence of the identified virus, but do not rule out bacterial infection or co-infection with other pathogens not detected by the test. Clinical correlation with patient history and other diagnostic information is necessary to determine patient infection status. The expected result is Negative.  Fact Sheet for Patients: EntrepreneurPulse.com.au  Fact Sheet for Healthcare Providers: IncredibleEmployment.be  This test is not yet approved or cleared by the [place] FDA and  has been authorized for detection and/or diagnosis of SARS-CoV-2 by FDA under an Emergency Use Authorization (EUA).  This EUA will remain in effect (meaning this test can be used) for the duration of  the COVID-19 declaration under Section 564(b)(1) of the A ct, 21 U.S.C. section 360bbb-3(b)(1), unless the authorization is terminated or revoked sooner.     Influenza A by PCR NEGATIVE NEGATIVE Final   Influenza B by PCR NEGATIVE NEGATIVE Final    Comment: (NOTE) The Xpert Xpress SARS-CoV-2/FLU/RSV plus assay is intended as an aid in the diagnosis of influenza from Nasopharyngeal swab specimens and should not be used as a sole basis for treatment. Nasal washings and aspirates are unacceptable for Xpert Xpress SARS-CoV-2/FLU/RSV testing.  Fact  Sheet for Patients: EntrepreneurPulse.com.au  Fact Sheet for Healthcare Providers: IncredibleEmployment.be  This test is not yet approved or cleared by the [place] FDA and has been authorized for detection and/or diagnosis of SARS-CoV-2 by FDA under an Emergency Use Authorization (EUA). This EUA will remain in effect (meaning this test can be used) for the duration of the COVID-19 declaration under Section 564(b)(1) of the Act, 21 U.S.C. section 360bbb-3(b)(1), unless the authorization is terminated or revoked.  Performed at [facility], [place] [address] [place], [place] [address]   Culture, blood (single)     Status: None (Preliminary result)   Collection Time: 07/21/21  3:20 AM   Specimen: BLOOD LEFT HAND  Result Value Ref Range Status   Specimen Description   Final    BLOOD LEFT HAND Performed at [facility] [address] [place], [place] [address]    Special Requests   Final    Blood Culture adequate volume BOTTLES DRAWN AEROBIC AND ANAEROBIC Performed at [facility] [address] [place], [place] [address]    Culture  Setup Time   Final    GRAM POSITIVE COCCI AEROBIC BOTTLE ONLY CRITICAL RESULT CALLED TO, READ BACK BY AND VERIFIED WITH: PHARMD ELLEN JACKSON 07/22/21@3 :41 BY TW IN BOTH AEROBIC AND ANAEROBIC BOTTLES Performed at [facility], [place] [address], [place],  [address]    Culture GRAM POSITIVE COCCI  Final   Report Status PENDING  Incomplete  Blood Culture ID Panel (Reflexed)     Status: Abnormal   Collection Time: 07/21/21  3:20 AM  Result Value Ref Range Status   Enterococcus faecalis NOT DETECTED NOT DETECTED Final   Enterococcus Faecium NOT DETECTED NOT DETECTED Final   Listeria monocytogenes NOT DETECTED NOT DETECTED Final   Staphylococcus species DETECTED (A) NOT DETECTED Final    Comment: CRITICAL RESULT CALLED TO, READ BACK BY AND VERIFIED WITH: PHARMD ELLEN JACKSON 07/22/21@3 :41 BY TW    Staphylococcus aureus (BCID) NOT DETECTED NOT DETECTED Final   Staphylococcus epidermidis NOT DETECTED NOT DETECTED Final   Staphylococcus lugdunensis NOT DETECTED NOT DETECTED Final   Streptococcus species DETECTED (A) NOT DETECTED Final    Comment: Not Enterococcus species, Streptococcus agalactiae, Streptococcus pyogenes, or Streptococcus pneumoniae. CRITICAL RESULT CALLED TO, READ BACK BY AND VERIFIED WITH: PHARMD ELLEN JACKSON 07/22/21@3 :41 BY TW    Streptococcus agalactiae NOT DETECTED NOT DETECTED Final   Streptococcus pneumoniae NOT DETECTED NOT  DETECTED Final   Streptococcus pyogenes NOT DETECTED NOT DETECTED Final   A.calcoaceticus-baumannii NOT DETECTED NOT DETECTED Final   Bacteroides fragilis NOT DETECTED NOT DETECTED Final   Enterobacterales NOT DETECTED NOT DETECTED Final   Enterobacter cloacae complex NOT DETECTED NOT DETECTED Final   Escherichia coli NOT DETECTED NOT DETECTED Final   Klebsiella aerogenes NOT DETECTED NOT DETECTED Final   Klebsiella oxytoca NOT DETECTED NOT DETECTED Final   Klebsiella pneumoniae NOT DETECTED NOT DETECTED Final   Proteus species NOT DETECTED NOT DETECTED Final   Salmonella species NOT DETECTED NOT DETECTED Final   Serratia marcescens NOT DETECTED NOT DETECTED Final   Haemophilus influenzae NOT DETECTED NOT DETECTED Final   Neisseria meningitidis NOT DETECTED NOT DETECTED Final   Pseudomonas aeruginosa NOT DETECTED NOT DETECTED Final   Stenotrophomonas maltophilia NOT DETECTED NOT DETECTED Final   Candida albicans NOT DETECTED NOT DETECTED Final   Candida auris NOT DETECTED NOT DETECTED Final   Candida glabrata NOT DETECTED NOT DETECTED Final   Candida krusei  NOT DETECTED NOT DETECTED Final   Candida parapsilosis NOT DETECTED NOT DETECTED Final   Candida tropicalis NOT DETECTED NOT DETECTED Final   Cryptococcus neoformans/gattii NOT DETECTED NOT DETECTED Final    Comment: Performed at [facility], [place] [address] [place], [place] [address]      Studies: No results found.  Scheduled Meds:  amiodarone  300 mg Oral Daily   amLODipine  10 mg Oral Daily   vitamin C  500 mg Oral Daily   aspirin EC  81 mg Oral Daily   cholecalciferol  1,000 Units Oral Daily   enoxaparin (LOVENOX) injection  40 mg Subcutaneous Daily   Ipratropium-Albuterol  1 puff Inhalation Q6H   methylPREDNISolone (SOLU-MEDROL) injection  0.5 mg/kg Intravenous Q12H   Followed by   Derrill Memo ON 07/25/2021] predniSONE  50 mg Oral Daily   omega-3 acid ethyl esters  1 g Oral Daily   vitamin B-12  1,000 mcg Oral Daily    zinc sulfate  220 mg Oral Daily    Continuous Infusions:  remdesivir 100 mg in NS 100 mL 100 mg (07/22/21 0950)     LOS: 1 day     Kayleen Memos, MD [facility] Pager [phone]  If 7PM-7AM, please contact night-coverage [email or web address] Password [facility] 07/22/2021, 5:53 PM

## 2021-07-23 LAB — CBC WITH DIFFERENTIAL/PLATELET
Abs Immature Granulocytes: 0.06 10*3/uL (ref 0.00–0.07)
Basophils Absolute: 0 10*3/uL (ref 0.0–0.1)
Basophils Relative: 0 %
Eosinophils Absolute: 0 10*3/uL (ref 0.0–0.5)
Eosinophils Relative: 0 %
HCT: 32.1 % — ABNORMAL LOW (ref 39.0–52.0)
Hemoglobin: 10.8 g/dL — ABNORMAL LOW (ref 13.0–17.0)
Immature Granulocytes: 1 %
Lymphocytes Relative: 10 %
Lymphs Abs: 0.8 10*3/uL (ref 0.7–4.0)
MCH: 33.4 pg (ref 26.0–34.0)
MCHC: 33.6 g/dL (ref 30.0–36.0)
MCV: 99.4 fL (ref 80.0–100.0)
Monocytes Absolute: 0.7 10*3/uL (ref 0.1–1.0)
Monocytes Relative: 8 %
Neutro Abs: 6.6 10*3/uL (ref 1.7–7.7)
Neutrophils Relative %: 81 %
Platelets: 156 10*3/uL (ref 150–400)
RBC: 3.23 MIL/uL — ABNORMAL LOW (ref 4.22–5.81)
RDW: 13.7 % (ref 11.5–15.5)
WBC: 8.1 10*3/uL (ref 4.0–10.5)
nRBC: 0 % (ref 0.0–0.2)

## 2021-07-23 LAB — COMPREHENSIVE METABOLIC PANEL
ALT: 118 U/L — ABNORMAL HIGH (ref 0–44)
AST: 97 U/L — ABNORMAL HIGH (ref 15–41)
Albumin: 3.5 g/dL (ref 3.5–5.0)
Alkaline Phosphatase: 76 U/L (ref 38–126)
Anion gap: 10 (ref 5–15)
BUN: 32 mg/dL — ABNORMAL HIGH (ref 8–23)
CO2: 25 mmol/L (ref 22–32)
Calcium: 8.5 mg/dL — ABNORMAL LOW (ref 8.9–10.3)
Chloride: 102 mmol/L (ref 98–111)
Creatinine, Ser: 1.13 mg/dL (ref 0.61–1.24)
GFR, Estimated: >60 mL/min (ref >60)
Glucose, Bld: 109 mg/dL — ABNORMAL HIGH (ref 70–99)
Potassium: 4.1 mmol/L (ref 3.5–5.1)
Sodium: 137 mmol/L (ref 135–145)
Total Bilirubin: 0.6 mg/dL (ref 0.3–1.2)
Total Protein: 6 g/dL — ABNORMAL LOW (ref 6.5–8.1)

## 2021-07-23 LAB — C-REACTIVE PROTEIN: CRP: 1.5 mg/dL — ABNORMAL HIGH (ref <1.0)

## 2021-07-23 LAB — D-DIMER, QUANTITATIVE: D-Dimer, Quant: 0.41 ug/mL-FEU (ref 0.00–0.50)

## 2021-07-23 LAB — PHOSPHORUS: Phosphorus: 3.3 mg/dL (ref 2.5–4.6)

## 2021-07-23 LAB — MAGNESIUM: Magnesium: 2.1 mg/dL (ref 1.7–2.4)

## 2021-07-23 MED ORDER — SODIUM CHLORIDE 0.9 % IV SOLN
2.0000 g | INTRAVENOUS | Status: DC
  Administered 2021-07-23 – 2021-07-24 (×2): 2 g via INTRAVENOUS
  Filled 2021-07-23 (×3): qty 20

## 2021-07-23 MED ORDER — FUROSEMIDE 10 MG/ML IJ SOLN
20.0000 mg | Freq: Once | INTRAMUSCULAR | Status: AC
  Administered 2021-07-23: 17:00:00 20 mg via INTRAVENOUS
  Filled 2021-07-23: qty 2

## 2021-07-23 NOTE — Progress Notes (Addendum)
PROGRESS NOTE  Thomas Doyle XKG:[id number] DOB: 01/03/1929 DOA: 07/21/2021 PCP: Leeroy Cha, MD  HPI/Recap of past 24 hours: Thomas Doyle is a 87 y.o. male with medical history significant of Large B-cell lymphoma, CKD3a, PAF not on anticoagulation, COPD. Presenting with sore throat and a productive cough. His symptoms started 3 days prior to presentation.  He tried some OTC meds, but they didn't help. He then started having shortness of breath, N/V and a headache. He took a home test for COVID and was positive. His symptoms worsened, so he decided to come to the ED for further evaluation management.  Started on antiviral, remdesivir, bronchodilators, and IV Solu-Medrol.  07/23/2021: Patient was seen and examined at bedside.  His cough is persistent but improved.  Currently on IV antiviral, IV antibiotics and IV Solu-Medrol.  We will continue scheduled bronchodilators and pulmonary toilet.  Assessment/Plan: Principal Problem:   COVID-19  COVID-19 viral infection, POA, superimposed by community-acquired pneumonia. Positive COVID-19 screening test on 07/21/2021 Sputum culture showing moderate gram-positive cocci. MRSA screening test negative Continue antiviral remdesivir Continue Rocephin empirically Continue bronchodilators Continue pulmonary toilet Antitussives Vitamin C, D3, zinc  Acute hypoxic respiratory failure secondary to above Not on oxygen supplementation at baseline Wean off oxygen supplementation as tolerated. Maintaining oxygen saturation greater than 90% on 2 L nasal cannula. 1 dose of IV Lasix given 20 mg x 1 on 07/23/2021. Home oxygen evaluation on 07/23/2021.  History of COPD/emphysema No prior history of tobacco use Management as stated above. Will need pulmonary referral at discharge.  Paroxysmal A. fib not on anticoagulation prior to admission Rate is currently controlled Continue home amiodarone Continue to monitor on  telemetry  Generalized weakness/physical debility PT assessed and recommended home health PT [facility] assisting with home health services arrangement. Fall precautions   Code Status: DNR  Family Communication: None at bedside  Disposition Plan: Likely will discharge to home once oxygen requirement is close to baseline  Anticipate DC on 07/26/2021.  The patient requires IV antivirals, IV antibiotics, and IV steroids for improvement of his COVID-19 viral infection complicated by community-acquired pneumonia.    Consultants: None  Procedures: None  Antimicrobials: Antiviral, remdesivir Rocephin  DVT prophylaxis: Subcu Lovenox daily  Status is: Inpatient  Patient requires at least 2 midnights for her evaluation and treatment of present condition.      Objective: Vitals:   07/22/21 0938 07/22/21 2131 07/23/21 0529 07/23/21 1211  BP: 134/62 (!) 121/56 131/68 134/67  Pulse:  60 (!) 56 66  Resp:  16 16 20   Temp:  98.3 F (36.8 C) 98.3 F (36.8 C) 98.1 F (36.7 C)  TempSrc:   Oral Oral  SpO2:  98% 99% 98%  Weight:      Height:        Intake/Output Summary (Last 24 hours) at 07/23/2021 1517 Last data filed at 07/23/2021 1300 Gross per 24 hour  Intake 680.29 ml  Output 2075 ml  Net -1394.71 ml   Filed Weights   07/21/21 0216 07/21/21 1600  Weight: 61.2 kg 61.2 kg    Exam:  General: 87 y.o. year-old male frail-appearing no acute distress.  He is alert and oriented x3.   Cardiovascular: Regular rate and rhythm no rubs or gallops. Respiratory: Diffuse rhonchorous sounds, diffuse wheezing.  Good inspiratory effort.   Abdomen: Soft nontender normal bowel sounds present.   Musculoskeletal: Trace lower extremity edema bilaterally.   Skin: No ulcerative lesions noted. Psychiatry: Mood is appropriate for condition and setting.  Data Reviewed: CBC: Recent Labs  Lab 07/21/21 0220 07/22/21 0401 07/23/21 0403  WBC 4.1 3.4* 8.1  NEUTROABS 2.9 2.8 6.6  HGB 10.4*  10.3* 10.8*  HCT 31.6* 30.4* 32.1*  MCV 102.6* 99.7 99.4  PLT 119* 122* 676   Basic Metabolic Panel: Recent Labs  Lab 07/21/21 0220 07/22/21 0401 07/23/21 0403  NA 137 136 137  K 3.9 4.2 4.1  CL 108 107 102  CO2 23 24 25   GLUCOSE 111* 144* 109*  BUN 24* 22 32*  CREATININE 1.18 0.92 1.13  CALCIUM 8.6* 8.7* 8.5*  MG  --   --  2.1  PHOS  --   --  3.3   GFR: Estimated Creatinine Clearance: 37.6 mL/min (by C-G formula based on SCr of 1.13 mg/dL). Liver Function Tests: Recent Labs  Lab 07/21/21 0220 07/22/21 0401 07/23/21 0403  AST 25 85* 97*  ALT 20 79* 118*  ALKPHOS 83 82 76  BILITOT 1.2 1.0 0.6  PROT 6.2* 5.8* 6.0*  ALBUMIN 3.7 3.4* 3.5   No results for input(s): LIPASE, AMYLASE in the last 168 hours. No results for input(s): AMMONIA in the last 168 hours. Coagulation Profile: Recent Labs  Lab 07/21/21 0220  INR 1.1   Cardiac Enzymes: No results for input(s): CKTOTAL, CKMB, CKMBINDEX, TROPONINI in the last 168 hours. BNP (last 3 results) No results for input(s): PROBNP in the last 8760 hours. HbA1C: No results for input(s): HGBA1C in the last 72 hours. CBG: No results for input(s): GLUCAP in the last 168 hours. Lipid Profile: Recent Labs    07/21/21 0319  TRIG 61   Thyroid Function Tests: No results for input(s): TSH, T4TOTAL, FREET4, T3FREE, THYROIDAB in the last 72 hours. Anemia Panel: Recent Labs    07/21/21 0319  FERRITIN 61   Urine analysis:    Component Value Date/Time   COLORURINE YELLOW 12/27/2016 1820   APPEARANCEUR CLEAR 12/27/2016 1820   LABSPEC 1.018 12/27/2016 1820   PHURINE 5.0 12/27/2016 1820   GLUCOSEU NEGATIVE 12/27/2016 1820   HGBUR NEGATIVE 12/27/2016 1820   BILIRUBINUR NEGATIVE 12/27/2016 1820   KETONESUR 20 (A) 12/27/2016 1820   PROTEINUR NEGATIVE 12/27/2016 1820   UROBILINOGEN 0.2 12/08/2011 2020   NITRITE NEGATIVE 12/27/2016 1820   LEUKOCYTESUR NEGATIVE 12/27/2016 1820   Sepsis  Labs: @LABRCNTIP (procalcitonin:4,lacticidven:4)  ) Recent Results (from the past 240 hour(s))  Resp Panel by RT-PCR (Flu A&B, Covid) Nasopharyngeal Swab     Status: Abnormal   Collection Time: 07/21/21  2:03 AM   Specimen: Nasopharyngeal Swab; Nasopharyngeal(NP) swabs in vial transport medium  Result Value Ref Range Status   SARS Coronavirus 2 by RT PCR POSITIVE (A) NEGATIVE Final    Comment: (NOTE) SARS-CoV-2 target nucleic acids are DETECTED.  The SARS-CoV-2 RNA is generally detectable in upper respiratory specimens during the acute phase of infection. Positive results are indicative of the presence of the identified virus, but do not rule out bacterial infection or co-infection with other pathogens not detected by the test. Clinical correlation with patient history and other diagnostic information is necessary to determine patient infection status. The expected result is Negative.  Fact Sheet for Patients: EntrepreneurPulse.com.au  Fact Sheet for Healthcare Providers: IncredibleEmployment.be  This test is not yet approved or cleared by the [place] FDA and  has been authorized for detection and/or diagnosis of SARS-CoV-2 by FDA under an Emergency Use Authorization (EUA).  This EUA will remain in effect (meaning this test can be used) for the duration of  the  COVID-19 declaration under Section 564(b)(1) of the A ct, 21 U.S.C. section 360bbb-3(b)(1), unless the authorization is terminated or revoked sooner.     Influenza A by PCR NEGATIVE NEGATIVE Final   Influenza B by PCR NEGATIVE NEGATIVE Final    Comment: (NOTE) The Xpert Xpress SARS-CoV-2/FLU/RSV plus assay is intended as an aid in the diagnosis of influenza from Nasopharyngeal swab specimens and should not be used as a sole basis for treatment. Nasal washings and aspirates are unacceptable for Xpert Xpress SARS-CoV-2/FLU/RSV testing.  Fact Sheet for  Patients: EntrepreneurPulse.com.au  Fact Sheet for Healthcare Providers: IncredibleEmployment.be  This test is not yet approved or cleared by the [place] FDA and has been authorized for detection and/or diagnosis of SARS-CoV-2 by FDA under an Emergency Use Authorization (EUA). This EUA will remain in effect (meaning this test can be used) for the duration of the COVID-19 declaration under Section 564(b)(1) of the Act, 21 U.S.C. section 360bbb-3(b)(1), unless the authorization is terminated or revoked.  Performed at [facility], [place] [address], [place], [place] [address]   Culture, blood (single)     Status: Abnormal (Preliminary result)   Collection Time: 07/21/21  3:20 AM   Specimen: BLOOD LEFT HAND  Result Value Ref Range Status   Specimen Description   Final    BLOOD LEFT HAND Performed at [facility] [address] [place], [place] [address]    Special Requests   Final    Blood Culture adequate volume BOTTLES DRAWN AEROBIC AND ANAEROBIC Performed at [facility] [address], [place], [place] [address]    Culture  Setup Time   Final    GRAM POSITIVE COCCI AEROBIC BOTTLE ONLY CRITICAL RESULT CALLED TO, READ BACK BY AND VERIFIED WITH: PHARMD ELLEN JACKSON 07/22/21@3 :41 BY TW IN BOTH AEROBIC AND ANAEROBIC BOTTLES Performed at [facility], [place] [address] [place], [place] [address]    Culture (A)  Final    STAPHYLOCOCCUS CAPITIS STREPTOCOCCUS MITIS/ORALIS    Report Status PENDING  Incomplete  Blood Culture ID Panel (Reflexed)     Status: Abnormal   Collection Time: 07/21/21  3:20 AM  Result Value Ref Range Status   Enterococcus faecalis NOT DETECTED NOT DETECTED Final   Enterococcus Faecium NOT DETECTED NOT DETECTED Final   Listeria monocytogenes NOT DETECTED NOT DETECTED Final   Staphylococcus species DETECTED (A) NOT DETECTED Final    Comment: CRITICAL  RESULT CALLED TO, READ BACK BY AND VERIFIED WITH: PHARMD ELLEN JACKSON 07/22/21@3 :41 BY TW    Staphylococcus aureus (BCID) NOT DETECTED NOT DETECTED Final   Staphylococcus epidermidis NOT DETECTED NOT DETECTED Final   Staphylococcus lugdunensis NOT DETECTED NOT DETECTED Final   Streptococcus species DETECTED (A) NOT DETECTED Final    Comment: Not Enterococcus species, Streptococcus agalactiae, Streptococcus pyogenes, or Streptococcus pneumoniae. CRITICAL RESULT CALLED TO, READ BACK BY AND VERIFIED WITH: PHARMD ELLEN JACKSON 07/22/21@3 :41 BY TW    Streptococcus agalactiae NOT DETECTED NOT DETECTED Final   Streptococcus pneumoniae NOT DETECTED NOT DETECTED Final   Streptococcus pyogenes NOT DETECTED NOT DETECTED Final   A.calcoaceticus-baumannii NOT DETECTED NOT DETECTED Final   Bacteroides fragilis NOT DETECTED NOT DETECTED Final   Enterobacterales NOT DETECTED NOT DETECTED Final   Enterobacter cloacae complex NOT DETECTED NOT DETECTED Final   Escherichia coli NOT DETECTED NOT DETECTED Final   Klebsiella aerogenes NOT DETECTED NOT DETECTED Final   Klebsiella oxytoca NOT DETECTED NOT DETECTED Final   Klebsiella pneumoniae NOT DETECTED NOT  DETECTED Final   Proteus species NOT DETECTED NOT DETECTED Final   Salmonella species NOT DETECTED NOT DETECTED Final   Serratia marcescens NOT DETECTED NOT DETECTED Final   Haemophilus influenzae NOT DETECTED NOT DETECTED Final   Neisseria meningitidis NOT DETECTED NOT DETECTED Final   Pseudomonas aeruginosa NOT DETECTED NOT DETECTED Final   Stenotrophomonas maltophilia NOT DETECTED NOT DETECTED Final   Candida albicans NOT DETECTED NOT DETECTED Final   Candida auris NOT DETECTED NOT DETECTED Final   Candida glabrata NOT DETECTED NOT DETECTED Final   Candida krusei NOT DETECTED NOT DETECTED Final   Candida parapsilosis NOT DETECTED NOT DETECTED Final   Candida tropicalis NOT DETECTED NOT DETECTED Final   Cryptococcus neoformans/gattii NOT DETECTED  NOT DETECTED Final    Comment: Performed at [facility], [place] [address] [place], [place] [address]  Expectorated Sputum Assessment w Gram Stain, Rflx to Resp Cult     Status: None   Collection Time: 07/22/21  6:00 PM   Specimen: Sputum  Result Value Ref Range Status   Specimen Description SPUTUM  Final   Special Requests NONE  Final   Sputum evaluation   Final    THIS SPECIMEN IS ACCEPTABLE FOR SPUTUM CULTURE Performed at [facility], [place] [address], [place], [place] [address]    Report Status 07/22/2021 FINAL  Final  Culture, Respiratory w Gram Stain     Status: None (Preliminary result)   Collection Time: 07/22/21  6:00 PM   Specimen: SPU  Result Value Ref Range Status   Specimen Description   Final    SPUTUM Performed at [facility] [address] [place], [place] [address]    Special Requests   Final    NONE Reflexed from [id number] Performed at [facility], [place] [address], [place], [place] [address]    Gram Stain   Final    NO SQUAMOUS EPITHELIAL CELLS SEEN FEW WBC SEEN MODERATE GRAM POSITIVE COCCI Performed at [facility], [place] [address], [place], [place] [address]    Culture PENDING  Incomplete   Report Status PENDING  Incomplete  MRSA Next Gen by PCR, Nasal     Status: None   Collection Time: 07/22/21  7:07 PM   Specimen: Nasal Mucosa; Nasal Swab  Result Value Ref Range Status   MRSA by PCR Next Gen NOT DETECTED NOT DETECTED Final    Comment: (NOTE) The GeneXpert MRSA Assay (FDA approved for NASAL specimens only), is one component of a comprehensive MRSA colonization surveillance program. It is not intended to diagnose MRSA infection nor to guide or monitor treatment for MRSA infections. Test performance is not FDA approved in patients less than 40 years old. Performed at [facility], [place] [address], [place], [place] [address]       Studies: No results  found.  Scheduled Meds:  amiodarone  300 mg Oral Daily   amLODipine  10 mg Oral Daily   vitamin C  500 mg Oral Daily   aspirin EC  81 mg Oral Daily   benzonatate  200 mg Oral TID   cholecalciferol  1,000 Units Oral Daily   enoxaparin (LOVENOX) injection  40 mg Subcutaneous Daily   furosemide  20 mg Intravenous Once   Ipratropium-Albuterol  1 puff Inhalation Q6H   methylPREDNISolone (SOLU-MEDROL) injection  0.5 mg/kg Intravenous Q12H   Followed by   Derrill Memo ON 07/25/2021] predniSONE  50 mg Oral Daily   omega-3 acid  ethyl esters  1 g Oral Daily   vitamin B-12  1,000 mcg Oral Daily   zinc sulfate  220 mg Oral Daily    Continuous Infusions:  cefTRIAXone (ROCEPHIN)  IV     remdesivir 100 mg in NS 100 mL 100 mg (07/23/21 1039)     LOS: 2 days     Kayleen Memos, MD [facility] Pager [phone]  If 7PM-7AM, please contact night-coverage [email or web address] Password [email or web address] 07/23/2021, 3:17 PM

## 2021-07-23 NOTE — Evaluation (Signed)
Physical Therapy Evaluation Patient Details Name: Thomas Doyle MRN: [id number] DOB: 1929-01-17 Today's Date: 07/23/2021  History of Present Illness  Thomas Doyle is a 87 y.o. male with medical history significant of Large B-cell lymphoma, CKD3a, PAF not on anticoagulation, COPD. Presenting with sore throat and a productive cough. Found to be COVID positive.  Clinical Impression  Pt admitted with above diagnosis. Pt currently with functional limitations due to the deficits listed below (see PT Problem List). Pt will benefit from skilled PT to increase their independence and safety with mobility to allow discharge to the venue listed below.  Pt reports falls with turning head sideways or looking up.  Educated on safety and recommend home environment assessment by HHPT.so they can assess him in his own environment. He says if he is using his RW he has much fewer falls.        Recommendations for follow up therapy are one component of a multi-disciplinary discharge planning process, led by the attending physician.  Recommendations may be updated based on patient status, additional functional criteria and insurance authorization.  Follow Up Recommendations Home health PT    Assistance Recommended at Discharge Intermittent Supervision/Assistance  Functional Status Assessment Patient has had a recent decline in their functional status and demonstrates the ability to make significant improvements in function in a reasonable and predictable amount of time.  Equipment Recommendations  None recommended by PT    Recommendations for Other Services       Precautions / Restrictions Precautions Precautions: Fall Restrictions Weight Bearing Restrictions: No      Mobility  Bed Mobility Overal bed mobility: Modified Independent             General bed mobility comments: up in recliner upon arrival, but was MOD I with OT earlier today    Transfers Overall transfer level: Needs  assistance Equipment used: Rolling walker (2 wheels) Transfers: Sit to/from Stand;Bed to chair/wheelchair/BSC Sit to Stand: Min guard           General transfer comment: min/guard for safety    Ambulation/Gait Ambulation/Gait assistance: Min guard Gait Distance (Feet): 40 Feet Assistive device: Rolling walker (2 wheels) Gait Pattern/deviations: Decreased step length - right;Decreased step length - left Gait velocity: decreased     General Gait Details: Increased time for turns and for backing up to chair  Stairs            Wheelchair Mobility    Modified Rankin (Stroke Patients Only)       Balance Overall balance assessment: History of Falls;Needs assistance Sitting-balance support: No upper extremity supported Sitting balance-Leahy Scale: Good     Standing balance support: During functional activity Standing balance-Leahy Scale: Fair Standing balance comment: can take hands off of walker for ADLs               High Level Balance Comments: Education provided for turning body with head turns and for safety with going to sit             Pertinent Vitals/Pain Pain Assessment: No/denies pain    Home Living Family/patient expects to be discharged to:: Private residence Living Arrangements: Children Available Help at Discharge: Family;Available 24 hours/day Type of Home: House Home Access: Stairs to enter Entrance Stairs-Rails: Can reach both Entrance Stairs-Number of Steps: 6 Alternate Level Stairs-Number of Steps: 12-14 Home Layout: Two level Home Equipment: Conservation officer, nature (2 wheels);Cane - single point;Wheelchair - manual      Prior Function  Mobility Comments: uses walker ADLs Comments: independent     Hand Dominance   Dominant Hand: Right    Extremity/Trunk Assessment   Upper Extremity Assessment Upper Extremity Assessment: Defer to OT evaluation RUE Deficits / Details: WFL ROM, 5/5 strength RUE Sensation:  history of peripheral neuropathy RUE Coordination: WNL LUE Deficits / Details: WFL ROM, 5/5 strength LUE Sensation: history of peripheral neuropathy LUE Coordination: WNL    Lower Extremity Assessment Lower Extremity Assessment: RLE deficits/detail;LLE deficits/detail RLE Sensation: history of peripheral neuropathy LLE Sensation: history of peripheral neuropathy    Cervical / Trunk Assessment Cervical / Trunk Assessment: Normal  Communication   Communication: No difficulties  Cognition Arousal/Alertness: Awake/alert Behavior During Therapy: WFL for tasks assessed/performed Overall Cognitive Status: Within Functional Limits for tasks assessed                                          General Comments General comments (skin integrity, edema, etc.): pleasant, coughing    Exercises     Assessment/Plan    PT Assessment Patient needs continued PT services  PT Problem List Decreased activity tolerance;Decreased balance;Decreased mobility;Decreased safety awareness       PT Treatment Interventions Gait training;Functional mobility training;Balance training;Therapeutic activities;Therapeutic exercise;Patient/family education    PT Goals (Current goals can be found in the Care Plan section)  Acute Rehab PT Goals Patient Stated Goal: stop coughing PT Goal Formulation: With patient Time For Goal Achievement: 08/06/21 Potential to Achieve Goals: Good    Frequency Min 3X/week   Barriers to discharge        Co-evaluation               AM-PAC PT "6 Clicks" Mobility  Outcome Measure Help needed turning from your back to your side while in a flat bed without using bedrails?: None Help needed moving from lying on your back to sitting on the side of a flat bed without using bedrails?: A Little Help needed moving to and from a bed to a chair (including a wheelchair)?: A Little Help needed standing up from a chair using your arms (e.g., wheelchair or bedside  chair)?: A Little Help needed to walk in hospital room?: A Little Help needed climbing 3-5 steps with a railing? : A Little 6 Click Score: 19    End of Session   Activity Tolerance: Patient limited by fatigue Patient left: in chair;with call bell/phone within reach;with chair alarm set Nurse Communication: Mobility status PT Visit Diagnosis: History of falling (Z91.81);Other abnormalities of gait and mobility (R26.89)    Time: 0100-7121 PT Time Calculation (min) (ACUTE ONLY): 23 min   Charges:   PT Evaluation $PT Eval Low Complexity: 1 Low PT Treatments $Gait Training: 8-22 mins       Thomas Doyle, [place]  07/23/2021     Galen Manila 07/23/2021, 11:33 AM

## 2021-07-23 NOTE — Evaluation (Signed)
Occupational Therapy Evaluation Patient Details Name: Thomas Doyle MRN: [id number] DOB: April 14, 1929 Today's Date: 07/23/2021   History of Present Illness [name] is a 87 y.o. male with medical history significant of Large B-cell lymphoma, CKD3a, PAF not on anticoagulation, COPD. Presenting with sore throat and a productive cough. Found to be COVID positive.   Clinical Impression   [name] is a 87 year old man admitted with above medical history. On evaluation he demonstrates ability to perform ADLs and ambulate in room with RW. He reports he feels weak but that is his baseline. He required intermittent steadying assist - as he turns with walker - but otherwise he did well. He reports falling almost daily at home due to profound peripheral neuropathy in his feet. Overall patient is near his baseline in regards to functional abilities. He also reports feeling near his baseline. Patient found on 3 L [place] but maintained 96% and above on RA. No OT needs at this time.     Recommendations for follow up therapy are one component of a multi-disciplinary discharge planning process, led by the attending physician.  Recommendations may be updated based on patient status, additional functional criteria and insurance authorization.   Follow Up Recommendations  No OT follow up    Assistance Recommended at Discharge None  Functional Status Assessment  Patient has not had a recent decline in their functional status  Equipment Recommendations  None recommended by OT    Recommendations for Other Services       Precautions / Restrictions Precautions Precautions: Fall Restrictions Weight Bearing Restrictions: No      Mobility Bed Mobility Overal bed mobility: Modified Independent             General bed mobility comments: increased time    Transfers Overall transfer level: Needs assistance Equipment used: Rolling walker (2 wheels) Transfers: Sit to/from Stand;Bed to  chair/wheelchair/BSC Sit to Stand: Min assist           General transfer comment: Min guard with RW. Mild unsteadiness with walker on turns - he reports this is common due to neuropathy in feet      Balance Overall balance assessment: History of Falls;Needs assistance Sitting-balance support: No upper extremity supported Sitting balance-Leahy Scale: Good     Standing balance support: During functional activity Standing balance-Leahy Scale: Fair Standing balance comment: can take hands off of walker for ADLs                           ADL either performed or assessed with clinical judgement   ADL Overall ADL's : At baseline                                             Vision Patient Visual Report: No change from baseline       Perception     Praxis      Pertinent Vitals/Pain Pain Assessment: No/denies pain     Hand Dominance Right   Extremity/Trunk Assessment Upper Extremity Assessment Upper Extremity Assessment: RUE deficits/detail;LUE deficits/detail RUE Deficits / Details: WFL ROM, 5/5 strength RUE Sensation: history of peripheral neuropathy RUE Coordination: WNL LUE Deficits / Details: WFL ROM, 5/5 strength LUE Sensation: history of peripheral neuropathy LUE Coordination: WNL   Lower Extremity Assessment Lower Extremity Assessment: Defer to PT evaluation (reports profound neuropathy)  Cervical / Trunk Assessment Cervical / Trunk Assessment: Normal   Communication Communication Communication: No difficulties   Cognition Arousal/Alertness: Awake/alert Behavior During Therapy: WFL for tasks assessed/performed Overall Cognitive Status: Within Functional Limits for tasks assessed                                       General Comments       Exercises     Shoulder Instructions      Home Living Family/patient expects to be discharged to:: Private residence Living Arrangements: Children Available Help  at Discharge: Family;Available 24 hours/day Type of Home: House Home Access: Stairs to enter [employer] of Steps: 6 Entrance Stairs-Rails: Can reach both Home Layout: Two level Alternate Level Stairs-Number of Steps: 12-14 Alternate Level Stairs-Rails: Left Bathroom Shower/Tub: Tub/shower unit   Bathroom Toilet: Handicapped height     Home Equipment: Conservation officer, nature (2 wheels);Cane - single point;Wheelchair - manual          Prior Functioning/Environment               Mobility Comments: uses walker ADLs Comments: independent        OT Problem List:        OT Treatment/Interventions:      OT Goals(Current goals can be found in the care plan section) Acute Rehab OT Goals OT Goal Formulation: All assessment and education complete, DC therapy  OT Frequency: Min 2X/week   Barriers to D/C:            Co-evaluation              AM-PAC OT "6 Clicks" Daily Activity     Outcome Measure Help from another person eating meals?: None Help from another person taking care of personal grooming?: None Help from another person toileting, which includes using toliet, bedpan, or urinal?: None Help from another person bathing (including washing, rinsing, drying)?: None Help from another person to put on and taking off regular upper body clothing?: None Help from another person to put on and taking off regular lower body clothing?: None 6 Click Score: 24   End of Session Equipment Utilized During Treatment: Rolling walker (2 wheels) Nurse Communication: Mobility status  Activity Tolerance: Patient tolerated treatment well Patient left: in chair;with call bell/phone within reach;with chair alarm set  OT Visit Diagnosis: Muscle weakness (generalized) (M62.81)                Time: 2542-7062 OT Time Calculation (min): 27 min Charges:  OT General Charges $OT Visit: 1 Visit OT Evaluation $OT Eval Low Complexity: 1 Low  Aniela Caniglia, OTR/L [place]  Office [phone] Pager: [phone]   Lenward Chancellor 07/23/2021, 9:56 AM

## 2021-07-24 LAB — CBC WITH DIFFERENTIAL/PLATELET
Abs Immature Granulocytes: 0.04 10*3/uL (ref 0.00–0.07)
Basophils Absolute: 0 10*3/uL (ref 0.0–0.1)
Basophils Relative: 0 %
Eosinophils Absolute: 0 10*3/uL (ref 0.0–0.5)
Eosinophils Relative: 0 %
HCT: 36.1 % — ABNORMAL LOW (ref 39.0–52.0)
Hemoglobin: 12 g/dL — ABNORMAL LOW (ref 13.0–17.0)
Immature Granulocytes: 1 %
Lymphocytes Relative: 8 %
Lymphs Abs: 0.6 10*3/uL — ABNORMAL LOW (ref 0.7–4.0)
MCH: 32.8 pg (ref 26.0–34.0)
MCHC: 33.2 g/dL (ref 30.0–36.0)
MCV: 98.6 fL (ref 80.0–100.0)
Monocytes Absolute: 0.1 10*3/uL (ref 0.1–1.0)
Monocytes Relative: 2 %
Neutro Abs: 6.3 10*3/uL (ref 1.7–7.7)
Neutrophils Relative %: 89 %
Platelets: 176 10*3/uL (ref 150–400)
RBC: 3.66 MIL/uL — ABNORMAL LOW (ref 4.22–5.81)
RDW: 13.4 % (ref 11.5–15.5)
WBC: 7.1 10*3/uL (ref 4.0–10.5)
nRBC: 0 % (ref 0.0–0.2)

## 2021-07-24 LAB — COMPREHENSIVE METABOLIC PANEL
ALT: 97 U/L — ABNORMAL HIGH (ref 0–44)
AST: 49 U/L — ABNORMAL HIGH (ref 15–41)
Albumin: 3.5 g/dL (ref 3.5–5.0)
Alkaline Phosphatase: 83 U/L (ref 38–126)
Anion gap: 6 (ref 5–15)
BUN: 38 mg/dL — ABNORMAL HIGH (ref 8–23)
CO2: 29 mmol/L (ref 22–32)
Calcium: 8.7 mg/dL — ABNORMAL LOW (ref 8.9–10.3)
Chloride: 100 mmol/L (ref 98–111)
Creatinine, Ser: 1.33 mg/dL — ABNORMAL HIGH (ref 0.61–1.24)
GFR, Estimated: 51 mL/min — ABNORMAL LOW (ref >60)
Glucose, Bld: 138 mg/dL — ABNORMAL HIGH (ref 70–99)
Potassium: 4.5 mmol/L (ref 3.5–5.1)
Sodium: 135 mmol/L (ref 135–145)
Total Bilirubin: 0.7 mg/dL (ref 0.3–1.2)
Total Protein: 6.3 g/dL — ABNORMAL LOW (ref 6.5–8.1)

## 2021-07-24 LAB — C-REACTIVE PROTEIN: CRP: 1 mg/dL — ABNORMAL HIGH (ref <1.0)

## 2021-07-24 LAB — CULTURE, BLOOD (SINGLE): Special Requests: ADEQUATE

## 2021-07-24 LAB — D-DIMER, QUANTITATIVE: D-Dimer, Quant: 0.35 ug/mL-FEU (ref 0.00–0.50)

## 2021-07-24 MED ORDER — TRAZODONE HCL 50 MG PO TABS
50.0000 mg | ORAL_TABLET | Freq: Every day | ORAL | Status: DC
  Administered 2021-07-24 – 2021-07-25 (×2): 50 mg via ORAL
  Filled 2021-07-24 (×2): qty 1

## 2021-07-24 MED ORDER — BENZONATATE 100 MG PO CAPS
200.0000 mg | ORAL_CAPSULE | Freq: Three times a day (TID) | ORAL | Status: DC
  Administered 2021-07-24 – 2021-07-26 (×5): 200 mg via ORAL
  Filled 2021-07-24 (×5): qty 2

## 2021-07-24 NOTE — Progress Notes (Signed)
PROGRESS NOTE  Thomas Doyle JJH:[id number] DOB: Nov 18, 1928 DOA: 07/21/2021 PCP: Leeroy Cha, MD  HPI/Recap of past 24 hours: Thomas Doyle is a 87 y.o. male with medical history significant of Large B-cell lymphoma, CKD3a, PAF not on anticoagulation, COPD. Presenting with sore throat and a productive cough. His symptoms started 3 days prior to presentation.  He tried some OTC meds, but they didn't help. He then started having shortness of breath, N/V and a headache. He took a home test for COVID and was positive. His symptoms worsened, so he decided to come to the ED for further evaluation management.  Started on antiviral, remdesivir, bronchodilators, and IV Solu-Medrol.  Blood cultures drawn on 07/21/2021, grew Staphylococcus capitis sepsis, Streptococcus Mitis oralis in single bottle.  Repeat blood culture peripherally x2 in the morning.  07/24/2021: Patient was seen at his bedside.  Reports persistent nonproductive cough.  Also reports situational insomnia.  Trazodone added nightly.  Assessment/Plan: Principal Problem:   COVID-19  COVID-19 viral infection, POA, superimposed by community-acquired pneumonia. Positive COVID-19 screening test on 07/21/2021 Sputum culture showing moderate gram-positive cocci. MRSA screening test negative Continue antiviral remdesivir Continue Rocephin empirically Continue bronchodilators Continue pulmonary toilet Antitussives, continue, Tessalon Perles 200 mg 3 times daily Continue Vitamin C, D3, zinc  Acute hypoxic respiratory failure secondary to above Not on oxygen supplementation at baseline Wean off oxygen supplementation as tolerated. Maintaining oxygen saturation greater than 90% on 2 L nasal cannula. 1 dose of IV Lasix given 20 mg x 1 on 07/23/2021. Home oxygen evaluation on 07/23/2021.  History of COPD/emphysema No prior history of tobacco use Management as stated above. Will need pulmonary referral at  discharge.  Paroxysmal A. fib not on anticoagulation prior to admission Rate is currently controlled Continue home amiodarone Continue to monitor on telemetry  Generalized weakness/physical debility PT assessed and recommended home health PT [facility] assisting with home health services arrangement. Fall precautions Continue PT OT with assistance and fall precautions.  Situational insomnia Start trazodone 50 mg nightly  Positive blood culture from single bottle Blood cultures drawn on 07/21/2021, grew Staphylococcus capitis sepsis, Streptococcus Mitis oralis in single bottle.  Repeat blood culture peripherally x2 in the morning.   Code Status: DNR  Family Communication: None at bedside  Disposition Plan: Likely will discharge to home once oxygen requirement is close to baseline  Anticipate DC on 07/26/2021.  The patient requires IV antivirals, IV antibiotics, and IV steroids for improvement of his COVID-19 viral infection complicated by community-acquired pneumonia.    Consultants: None  Procedures: None  Antimicrobials: Antiviral, remdesivir Rocephin  DVT prophylaxis: Subcu Lovenox daily  Status is: Inpatient  Patient requires at least 2 midnights for her evaluation and treatment of present condition.      Objective: Vitals:   07/23/21 1211 07/23/21 2057 07/24/21 0554 07/24/21 1226  BP: 134/67 132/66 135/68 129/69  Pulse: 66 65 68 60  Resp: 20 20 20 18   Temp: 98.1 F (36.7 C) 97.7 F (36.5 C) 98.7 F (37.1 C) 98.4 F (36.9 C)  TempSrc: Oral Oral Oral Oral  SpO2: 98% 97% 99% 97%  Weight:      Height:        Intake/Output Summary (Last 24 hours) at 07/24/2021 1636 Last data filed at 07/24/2021 0556 Gross per 24 hour  Intake 440 ml  Output 1100 ml  Net -660 ml   Filed Weights   07/21/21 0216 07/21/21 1600  Weight: 61.2 kg 61.2 kg    Exam:  General:  87 y.o. year-old male frail-appearing no acute distress.  He is alert and oriented  x3. Cardiovascular: Regular rate and rhythm no rubs or gallops.  No JVD.   Respiratory: Mild rales with exam, mild wheezing.  Good inspiratory effort. Abdomen: Soft nontender normal bowel sounds present.   Musculoskeletal: No lower extremity edema bilaterally. Skin: No ulcerative lesions noted. Psychiatry: Mood is appropriate for condition and setting.   Data Reviewed: CBC: Recent Labs  Lab 07/21/21 0220 07/22/21 0401 07/23/21 0403 07/24/21 0443  WBC 4.1 3.4* 8.1 7.1  NEUTROABS 2.9 2.8 6.6 6.3  HGB 10.4* 10.3* 10.8* 12.0*  HCT 31.6* 30.4* 32.1* 36.1*  MCV 102.6* 99.7 99.4 98.6  PLT 119* 122* 156 599   Basic Metabolic Panel: Recent Labs  Lab 07/21/21 0220 07/22/21 0401 07/23/21 0403 07/24/21 0443  NA 137 136 137 135  K 3.9 4.2 4.1 4.5  CL 108 107 102 100  CO2 23 24 25 29   GLUCOSE 111* 144* 109* 138*  BUN 24* 22 32* 38*  CREATININE 1.18 0.92 1.13 1.33*  CALCIUM 8.6* 8.7* 8.5* 8.7*  MG  --   --  2.1  --   PHOS  --   --  3.3  --    GFR: Estimated Creatinine Clearance: 32 mL/min (A) (by C-G formula based on SCr of 1.33 mg/dL (H)). Liver Function Tests: Recent Labs  Lab 07/21/21 0220 07/22/21 0401 07/23/21 0403 07/24/21 0443  AST 25 85* 97* 49*  ALT 20 79* 118* 97*  ALKPHOS 83 82 76 83  BILITOT 1.2 1.0 0.6 0.7  PROT 6.2* 5.8* 6.0* 6.3*  ALBUMIN 3.7 3.4* 3.5 3.5   No results for input(s): LIPASE, AMYLASE in the last 168 hours. No results for input(s): AMMONIA in the last 168 hours. Coagulation Profile: Recent Labs  Lab 07/21/21 0220  INR 1.1   Cardiac Enzymes: No results for input(s): CKTOTAL, CKMB, CKMBINDEX, TROPONINI in the last 168 hours. BNP (last 3 results) No results for input(s): PROBNP in the last 8760 hours. HbA1C: No results for input(s): HGBA1C in the last 72 hours. CBG: No results for input(s): GLUCAP in the last 168 hours. Lipid Profile: No results for input(s): CHOL, HDL, LDLCALC, TRIG, CHOLHDL, LDLDIRECT in the last 72  hours.  Thyroid Function Tests: No results for input(s): TSH, T4TOTAL, FREET4, T3FREE, THYROIDAB in the last 72 hours. Anemia Panel: No results for input(s): VITAMINB12, FOLATE, FERRITIN, TIBC, IRON, RETICCTPCT in the last 72 hours.  Urine analysis:    Component Value Date/Time   COLORURINE YELLOW 12/27/2016 1820   APPEARANCEUR CLEAR 12/27/2016 1820   LABSPEC 1.018 12/27/2016 1820   PHURINE 5.0 12/27/2016 1820   GLUCOSEU NEGATIVE 12/27/2016 1820   HGBUR NEGATIVE 12/27/2016 1820   BILIRUBINUR NEGATIVE 12/27/2016 1820   KETONESUR 20 (A) 12/27/2016 1820   PROTEINUR NEGATIVE 12/27/2016 1820   UROBILINOGEN 0.2 12/08/2011 2020   NITRITE NEGATIVE 12/27/2016 1820   LEUKOCYTESUR NEGATIVE 12/27/2016 1820   Sepsis Labs: @LABRCNTIP (procalcitonin:4,lacticidven:4)  ) Recent Results (from the past 240 hour(s))  Resp Panel by RT-PCR (Flu A&B, Covid) Nasopharyngeal Swab     Status: Abnormal   Collection Time: 07/21/21  2:03 AM   Specimen: Nasopharyngeal Swab; Nasopharyngeal(NP) swabs in vial transport medium  Result Value Ref Range Status   SARS Coronavirus 2 by RT PCR POSITIVE (A) NEGATIVE Final    Comment: (NOTE) SARS-CoV-2 target nucleic acids are DETECTED.  The SARS-CoV-2 RNA is generally detectable in upper respiratory specimens during the acute phase of infection. Positive results  are indicative of the presence of the identified virus, but do not rule out bacterial infection or co-infection with other pathogens not detected by the test. Clinical correlation with patient history and other diagnostic information is necessary to determine patient infection status. The expected result is Negative.  Fact Sheet for Patients: EntrepreneurPulse.com.au  Fact Sheet for Healthcare Providers: IncredibleEmployment.be  This test is not yet approved or cleared by the [place] FDA and  has been authorized for detection and/or diagnosis of SARS-CoV-2  by FDA under an Emergency Use Authorization (EUA).  This EUA will remain in effect (meaning this test can be used) for the duration of  the COVID-19 declaration under Section 564(b)(1) of the A ct, 21 U.S.C. section 360bbb-3(b)(1), unless the authorization is terminated or revoked sooner.     Influenza A by PCR NEGATIVE NEGATIVE Final   Influenza B by PCR NEGATIVE NEGATIVE Final    Comment: (NOTE) The Xpert Xpress SARS-CoV-2/FLU/RSV plus assay is intended as an aid in the diagnosis of influenza from Nasopharyngeal swab specimens and should not be used as a sole basis for treatment. Nasal washings and aspirates are unacceptable for Xpert Xpress SARS-CoV-2/FLU/RSV testing.  Fact Sheet for Patients: EntrepreneurPulse.com.au  Fact Sheet for Healthcare Providers: IncredibleEmployment.be  This test is not yet approved or cleared by the [place] FDA and has been authorized for detection and/or diagnosis of SARS-CoV-2 by FDA under an Emergency Use Authorization (EUA). This EUA will remain in effect (meaning this test can be used) for the duration of the COVID-19 declaration under Section 564(b)(1) of the Act, 21 U.S.C. section 360bbb-3(b)(1), unless the authorization is terminated or revoked.  Performed at [facility], [place] [address], [place], [place] [address]   Culture, blood (single)     Status: Abnormal   Collection Time: 07/21/21  3:20 AM   Specimen: BLOOD LEFT HAND  Result Value Ref Range Status   Specimen Description   Final    BLOOD LEFT HAND Performed at [facility] [address] [place], [place] [address]    Special Requests   Final    Blood Culture adequate volume BOTTLES DRAWN AEROBIC AND ANAEROBIC Performed at [place] [address] [place], [place] [address]    Culture  Setup Time   Final    GRAM POSITIVE COCCI AEROBIC BOTTLE ONLY CRITICAL RESULT CALLED  TO, READ BACK BY AND VERIFIED WITH: PHARMD ELLEN JACKSON 07/22/21@3 :41 BY TW IN BOTH AEROBIC AND ANAEROBIC BOTTLES    Culture (A)  Final    STAPHYLOCOCCUS CAPITIS STREPTOCOCCUS MITIS/ORALIS THE SIGNIFICANCE OF ISOLATING THIS ORGANISM FROM A SINGLE VENIPUNCTURE CANNOT BE PREDICTED WITHOUT FURTHER CLINICAL AND CULTURE CORRELATION. SUSCEPTIBILITIES AVAILABLE ONLY ON REQUEST. Performed at [facility], [place] [address] [place],  [address]    Report Status 07/24/2021 FINAL  Final  Blood Culture ID Panel (Reflexed)     Status: Abnormal   Collection Time: 07/21/21  3:20 AM  Result Value Ref Range Status   Enterococcus faecalis NOT DETECTED NOT DETECTED Final   Enterococcus Faecium NOT DETECTED NOT DETECTED Final   Listeria monocytogenes NOT DETECTED NOT DETECTED Final   Staphylococcus species DETECTED (A) NOT DETECTED Final    Comment: CRITICAL RESULT CALLED TO, READ BACK BY AND VERIFIED WITH: PHARMD ELLEN JACKSON 07/22/21@3 :41 BY TW    Staphylococcus aureus (BCID) NOT DETECTED NOT DETECTED Final   Staphylococcus epidermidis NOT DETECTED NOT DETECTED Final   Staphylococcus lugdunensis NOT DETECTED NOT DETECTED Final  Streptococcus species DETECTED (A) NOT DETECTED Final    Comment: Not Enterococcus species, Streptococcus agalactiae, Streptococcus pyogenes, or Streptococcus pneumoniae. CRITICAL RESULT CALLED TO, READ BACK BY AND VERIFIED WITH: PHARMD ELLEN JACKSON 07/22/21@3 :41 BY TW    Streptococcus agalactiae NOT DETECTED NOT DETECTED Final   Streptococcus pneumoniae NOT DETECTED NOT DETECTED Final   Streptococcus pyogenes NOT DETECTED NOT DETECTED Final   A.calcoaceticus-baumannii NOT DETECTED NOT DETECTED Final   Bacteroides fragilis NOT DETECTED NOT DETECTED Final   Enterobacterales NOT DETECTED NOT DETECTED Final   Enterobacter cloacae complex NOT DETECTED NOT DETECTED Final   Escherichia coli NOT DETECTED NOT DETECTED Final   Klebsiella aerogenes NOT DETECTED NOT  DETECTED Final   Klebsiella oxytoca NOT DETECTED NOT DETECTED Final   Klebsiella pneumoniae NOT DETECTED NOT DETECTED Final   Proteus species NOT DETECTED NOT DETECTED Final   Salmonella species NOT DETECTED NOT DETECTED Final   Serratia marcescens NOT DETECTED NOT DETECTED Final   Haemophilus influenzae NOT DETECTED NOT DETECTED Final   Neisseria meningitidis NOT DETECTED NOT DETECTED Final   Pseudomonas aeruginosa NOT DETECTED NOT DETECTED Final   Stenotrophomonas maltophilia NOT DETECTED NOT DETECTED Final   Candida albicans NOT DETECTED NOT DETECTED Final   Candida auris NOT DETECTED NOT DETECTED Final   Candida glabrata NOT DETECTED NOT DETECTED Final   Candida krusei NOT DETECTED NOT DETECTED Final   Candida parapsilosis NOT DETECTED NOT DETECTED Final   Candida tropicalis NOT DETECTED NOT DETECTED Final   Cryptococcus neoformans/gattii NOT DETECTED NOT DETECTED Final    Comment: Performed at [facility], [address] [place], [place] [address]  Expectorated Sputum Assessment w Gram Stain, Rflx to Resp Cult     Status: None   Collection Time: 07/22/21  6:00 PM   Specimen: Sputum  Result Value Ref Range Status   Specimen Description SPUTUM  Final   Special Requests NONE  Final   Sputum evaluation   Final    THIS SPECIMEN IS ACCEPTABLE FOR SPUTUM CULTURE Performed at [facility], [place] [address], [place], [place] [address]    Report Status 07/22/2021 FINAL  Final  Culture, Respiratory w Gram Stain     Status: None (Preliminary result)   Collection Time: 07/22/21  6:00 PM   Specimen: SPU  Result Value Ref Range Status   Specimen Description   Final    SPUTUM Performed at [place] [address], [place], [place] [address]    Special Requests   Final    NONE Reflexed from [id number] Performed at [facility], [place] [address], [place], [place] [address]    Gram Stain   Final    NO SQUAMOUS EPITHELIAL CELLS  SEEN FEW WBC SEEN MODERATE GRAM POSITIVE COCCI    Culture   Final    CULTURE REINCUBATED FOR BETTER GROWTH Performed at [facility], [place] [address], [place], [place] [address]    Report Status PENDING  Incomplete  MRSA Next Gen by PCR, Nasal     Status: None   Collection Time: 07/22/21  7:07 PM   Specimen: Nasal Mucosa; Nasal Swab  Result Value Ref Range Status   MRSA by PCR Next Gen NOT DETECTED NOT DETECTED Final    Comment: (NOTE) The GeneXpert MRSA Assay (FDA approved for NASAL specimens only), is one component of a comprehensive MRSA colonization surveillance program. It is not intended to diagnose MRSA infection nor to guide or monitor treatment for MRSA infections.  Test performance is not FDA approved in patients less than 33 years old. Performed at [facility], [place] [address] [place], [place] [address]       Studies: No results found.  Scheduled Meds:  amiodarone  300 mg Oral Daily   amLODipine  10 mg Oral Daily   vitamin C  500 mg Oral Daily   aspirin EC  81 mg Oral Daily   benzonatate  200 mg Oral TID   cholecalciferol  1,000 Units Oral Daily   enoxaparin (LOVENOX) injection  40 mg Subcutaneous Daily   Ipratropium-Albuterol  1 puff Inhalation Q6H   omega-3 acid ethyl esters  1 g Oral Daily   [START ON 07/25/2021] predniSONE  50 mg Oral Daily   traZODone  50 mg Oral QHS   vitamin B-12  1,000 mcg Oral Daily   zinc sulfate  220 mg Oral Daily    Continuous Infusions:  cefTRIAXone (ROCEPHIN)  IV Stopped (07/23/21 1755)   remdesivir 100 mg in NS 100 mL Stopped (07/23/21 1109)     LOS: 3 days     Kayleen Memos, MD [facility] Pager [phone]  If 7PM-7AM, please contact night-coverage [email or web address] Password [facility] 07/24/2021, 4:36 PM

## 2021-07-24 NOTE — TOC Progression Note (Signed)
Transition of Care [facility]) - Progression Note    Patient Details  Name: Thomas Doyle MRN: [id number] Date of Birth: 1929/02/12  Transition [facility] CM/SW Contact  Leeroy Cha, RN Phone Number: 07/24/2021, 2:17 PM  Clinical Narrative:    Hhc for p.t. will be done through advanced hhc.   Expected Discharge Plan: [place] Barriers to Discharge: Continued Medical Work up  Expected Discharge Plan and Services Expected Discharge Plan: [place]   Discharge Planning Services: CM Consult Post Acute Care Choice: [place] arrangements for the past 2 months: [place]: PT [place]: [place] ([place]) Date [place]: 07/24/21 Time [place]: 1410 Representative spoke with at [place]: [place] ([place]) Interventions    Readmission Risk Interventions No flowsheet data found.

## 2021-07-24 NOTE — Plan of Care (Signed)
  Problem: Education: Goal: Knowledge of General Education information will improve Description: Including pain rating scale, medication(s)/side effects and non-pharmacologic comfort measures Outcome: Progressing   Problem: Pain Managment: Goal: General experience of comfort will improve Outcome: Progressing   

## 2021-07-25 LAB — COMPREHENSIVE METABOLIC PANEL
ALT: 73 U/L — ABNORMAL HIGH (ref 0–44)
AST: 36 U/L (ref 15–41)
Albumin: 3.3 g/dL — ABNORMAL LOW (ref 3.5–5.0)
Alkaline Phosphatase: 73 U/L (ref 38–126)
Anion gap: 7 (ref 5–15)
BUN: 36 mg/dL — ABNORMAL HIGH (ref 8–23)
CO2: 27 mmol/L (ref 22–32)
Calcium: 8.4 mg/dL — ABNORMAL LOW (ref 8.9–10.3)
Chloride: 103 mmol/L (ref 98–111)
Creatinine, Ser: 1.08 mg/dL (ref 0.61–1.24)
GFR, Estimated: >60 mL/min (ref >60)
Glucose, Bld: 90 mg/dL (ref 70–99)
Potassium: 4.1 mmol/L (ref 3.5–5.1)
Sodium: 137 mmol/L (ref 135–145)
Total Bilirubin: 0.6 mg/dL (ref 0.3–1.2)
Total Protein: 5.7 g/dL — ABNORMAL LOW (ref 6.5–8.1)

## 2021-07-25 LAB — CBC WITH DIFFERENTIAL/PLATELET
Abs Immature Granulocytes: 0.03 K/uL (ref 0.00–0.07)
Basophils Absolute: 0 K/uL (ref 0.0–0.1)
Basophils Relative: 0 %
Eosinophils Absolute: 0.2 K/uL (ref 0.0–0.5)
Eosinophils Relative: 3 %
HCT: 34.9 % — ABNORMAL LOW (ref 39.0–52.0)
Hemoglobin: 11.4 g/dL — ABNORMAL LOW (ref 13.0–17.0)
Immature Granulocytes: 1 %
Lymphocytes Relative: 21 %
Lymphs Abs: 1.2 K/uL (ref 0.7–4.0)
MCH: 32.9 pg (ref 26.0–34.0)
MCHC: 32.7 g/dL (ref 30.0–36.0)
MCV: 100.6 fL — ABNORMAL HIGH (ref 80.0–100.0)
Monocytes Absolute: 0.5 K/uL (ref 0.1–1.0)
Monocytes Relative: 8 %
Neutro Abs: 4.1 K/uL (ref 1.7–7.7)
Neutrophils Relative %: 67 %
Platelets: 153 K/uL (ref 150–400)
RBC: 3.47 MIL/uL — ABNORMAL LOW (ref 4.22–5.81)
RDW: 13.5 % (ref 11.5–15.5)
WBC: 6 K/uL (ref 4.0–10.5)
nRBC: 0 % (ref 0.0–0.2)

## 2021-07-25 LAB — C-REACTIVE PROTEIN: CRP: 0.6 mg/dL (ref <1.0)

## 2021-07-25 LAB — D-DIMER, QUANTITATIVE: D-Dimer, Quant: <0.27 ug/mL-FEU (ref 0.00–0.50)

## 2021-07-25 MED ORDER — ALBUTEROL SULFATE HFA 108 (90 BASE) MCG/ACT IN AERS: 2.0000 | INHALATION_SPRAY | Freq: Four times a day (QID) | RESPIRATORY_TRACT | 0 refills | Status: AC | PRN

## 2021-07-25 MED ORDER — SODIUM CHLORIDE 0.9 % IV SOLN
2.0000 g | INTRAVENOUS | Status: DC
  Administered 2021-07-25: 2 g via INTRAVENOUS
  Filled 2021-07-25: qty 20

## 2021-07-25 MED ORDER — ASCORBIC ACID 500 MG PO TABS: 500.0000 mg | ORAL_TABLET | Freq: Every day | ORAL | 0 refills | Status: AC

## 2021-07-25 MED ORDER — TRAZODONE HCL 50 MG PO TABS: 50.0000 mg | ORAL_TABLET | Freq: Every evening | ORAL | 0 refills | Status: DC | PRN

## 2021-07-25 MED ORDER — ZINC SULFATE 220 (50 ZN) MG PO CAPS: 220.0000 mg | ORAL_CAPSULE | Freq: Every day | ORAL | 0 refills | Status: AC

## 2021-07-25 MED ORDER — BENZONATATE 200 MG PO CAPS: 200.0000 mg | ORAL_CAPSULE | Freq: Three times a day (TID) | ORAL | 0 refills | Status: AC | PRN

## 2021-07-25 MED ORDER — IPRATROPIUM-ALBUTEROL 20-100 MCG/ACT IN AERS: 1.0000 | INHALATION_SPRAY | Freq: Four times a day (QID) | RESPIRATORY_TRACT | Status: DC | PRN

## 2021-07-26 LAB — COMPREHENSIVE METABOLIC PANEL
ALT: 68 U/L — ABNORMAL HIGH (ref 0–44)
AST: 32 U/L (ref 15–41)
Albumin: 3.1 g/dL — ABNORMAL LOW (ref 3.5–5.0)
Alkaline Phosphatase: 73 U/L (ref 38–126)
Anion gap: 6 (ref 5–15)
BUN: 33 mg/dL — ABNORMAL HIGH (ref 8–23)
CO2: 26 mmol/L (ref 22–32)
Calcium: 8.2 mg/dL — ABNORMAL LOW (ref 8.9–10.3)
Chloride: 104 mmol/L (ref 98–111)
Creatinine, Ser: 1.13 mg/dL (ref 0.61–1.24)
GFR, Estimated: >60 mL/min (ref >60)
Glucose, Bld: 114 mg/dL — ABNORMAL HIGH (ref 70–99)
Potassium: 4.3 mmol/L (ref 3.5–5.1)
Sodium: 136 mmol/L (ref 135–145)
Total Bilirubin: 0.5 mg/dL (ref 0.3–1.2)
Total Protein: 5.4 g/dL — ABNORMAL LOW (ref 6.5–8.1)

## 2021-07-26 LAB — CBC WITH DIFFERENTIAL/PLATELET
Abs Immature Granulocytes: 0.04 10*3/uL (ref 0.00–0.07)
Basophils Absolute: 0 10*3/uL (ref 0.0–0.1)
Basophils Relative: 0 %
Eosinophils Absolute: 0 10*3/uL (ref 0.0–0.5)
Eosinophils Relative: 0 %
HCT: 33.1 % — ABNORMAL LOW (ref 39.0–52.0)
Hemoglobin: 11 g/dL — ABNORMAL LOW (ref 13.0–17.0)
Immature Granulocytes: 1 %
Lymphocytes Relative: 14 %
Lymphs Abs: 0.7 10*3/uL (ref 0.7–4.0)
MCH: 32.7 pg (ref 26.0–34.0)
MCHC: 33.2 g/dL (ref 30.0–36.0)
MCV: 98.5 fL (ref 80.0–100.0)
Monocytes Absolute: 0.3 10*3/uL (ref 0.1–1.0)
Monocytes Relative: 5 %
Neutro Abs: 4.2 10*3/uL (ref 1.7–7.7)
Neutrophils Relative %: 80 %
Platelets: 159 10*3/uL (ref 150–400)
RBC: 3.36 MIL/uL — ABNORMAL LOW (ref 4.22–5.81)
RDW: 13.2 % (ref 11.5–15.5)
WBC: 5.3 10*3/uL (ref 4.0–10.5)
nRBC: 0 % (ref 0.0–0.2)

## 2021-07-26 LAB — CULTURE, RESPIRATORY W GRAM STAIN
Culture: NORMAL
Gram Stain: NONE SEEN

## 2021-07-26 LAB — D-DIMER, QUANTITATIVE: D-Dimer, Quant: 0.44 ug/mL-FEU (ref 0.00–0.50)

## 2021-07-26 LAB — C-REACTIVE PROTEIN: CRP: 0.7 mg/dL (ref <1.0)

## 2021-07-26 MED ORDER — SODIUM CHLORIDE 0.9 % IV SOLN
2.0000 g | INTRAVENOUS | Status: AC
  Administered 2021-07-26: 2 g via INTRAVENOUS
  Filled 2021-07-26: qty 20

## 2021-07-26 NOTE — Discharge Summary (Signed)
Discharge Summary  Thomas Doyle AOZ:[id number] DOB: 1928/08/09  PCP: Leeroy Cha, MD  Admit date: 07/21/2021 Discharge date: 07/26/2021  Time spent: 35 minutes.  Recommendations for Outpatient Follow-up:  Follow-up with your PCP Please obtain pulmonary referral from your PCP. Take your medications as prescribed Continue PT OT with assistance and fall precautions  Discharge Diagnoses:  Active Hospital Problems   Diagnosis Date Noted   COVID-19 07/21/2021    Resolved Hospital Problems  No resolved problems to display.    Discharge Condition: Stable  Diet recommendation: Resume previous diet.  Vitals:   07/25/21 2023 07/26/21 0411  BP: 127/65 (!) 148/64  Pulse: (!) 57 (!) 51  Resp: 18 18  Temp: 98.3 F (36.8 C) 98.4 F (36.9 C)  SpO2: 97% 100%    History of present illness:  Thomas Doyle is a 88 y.o. male with medical history significant of Large B-cell lymphoma, PAF not on anticoagulation, ?COPD. Presenting with sore throat and a productive cough of 3 days duration.  Associated with dyspnea with minimal exertion, nausea, vomiting and a headache.  He tried OTC medications without relief of his symptoms.  He took a home test for COVID-19 and was positive.  His symptoms worsened, so he decided to come to the ED for further evaluation and management of his symptomatology.  Received antiviral, remdesivir, IV steroids, IV antibiotics, pulmonary toilet, and bronchodilators.   Blood cultures drawn on 07/21/2021, grew Staphylococcus capitis sepsis, Streptococcus Mitis oralis in single bottle, thought to be a contaminant.  Repeat blood culture x2 peripherally negative to date.   07/26/2021: Vital signs and labs stable.  No acute events overnight.  Will discharge to home with home health services PT OT.    Hospital Course:  Principal Problem:   COVID-19  COVID-19 viral infection, POA, superimposed by bacterial community-acquired pneumonia. Positive COVID-19  screening test on 07/21/2021, in-house. Sputum culture showing moderate gram-positive cocci, reincubated for better growth. MRSA screening test negative Completed 5 days of IV antiviral remdesivir Completed 5 days of Rocephin empirically Received pulmonary toilet, bronchodilators, steroids, antitussives. Continue Vitamin C, D3, zinc   Resolved acute hypoxic respiratory failure secondary to above Not on oxygen supplementation at baseline Weaned off oxygen supplementation. O2 saturation 100% on room air.  Questionable history of COPD No prior history of tobacco use Please obtain pulmonary referral from your PCP, for possible PFT evaluation at pulmonary's office.   Paroxysmal A. fib not on anticoagulation prior to admission Rate is controlled Continue home regimen and follow-up with your cardiologist.   Improved generalized weakness/physical debility Evaluated by PT with recommendation for home health PT.   Continue fall precautions.   Situational insomnia Continue trazodone 50 mg nightly as needed   Positive blood culture from single bottle, thought to be a contaminant Blood cultures drawn on 07/21/2021, grew Staphylococcus capitis sepsis, Streptococcus Mitis oralis in single bottle.  Repeat blood culture peripherally x2 on 07/25/2021 negative to date.     Code Status: DNR      Consultants: None   Procedures: None   Antimicrobials: Antiviral, remdesivir Rocephin     Discharge Exam: BP (!) 148/64 (BP Location: Right Arm)    Pulse (!) 51    Temp 98.4 F (36.9 C)    Resp 18    Ht 5\' 10"  (1.778 m)    Wt 61.2 kg    SpO2 100%    BMI 19.37 kg/m  General: 88 y.o. year-old male well developed well nourished in no acute distress.  Alert and oriented x3. Cardiovascular: Regular rate and rhythm with no rubs or gallops.  No thyromegaly or JVD noted.   Respiratory: Clear to auscultation with no wheezes or rales. Good inspiratory effort. Abdomen: Soft nontender nondistended with  normal bowel sounds x4 quadrants. Musculoskeletal: No lower extremity edema. 2/4 pulses in all 4 extremities. Skin: No ulcerative lesions noted or rashes, Psychiatry: Mood is appropriate for condition and setting  Discharge Instructions You were cared for by a hospitalist during your hospital stay. If you have any questions about your discharge medications or the care you received while you were in the hospital after you are discharged, you can call the unit and asked to speak with the hospitalist on call if the hospitalist that took care of you is not available. Once you are discharged, your primary care physician will handle any further medical issues. Please note that NO REFILLS for any discharge medications will be authorized once you are discharged, as it is imperative that you return to your primary care physician (or establish a relationship with a primary care physician if you do not have one) for your aftercare needs so that they can reassess your need for medications and monitor your lab values.   Allergies as of 07/26/2021       Reactions   Codeine Nausea Only        Medication List     STOP taking these medications    metoprolol succinate 25 MG 24 hr tablet Commonly known as: TOPROL-XL       TAKE these medications    albuterol 108 (90 Base) MCG/ACT inhaler Commonly known as: VENTOLIN HFA Inhale 2 puffs into the lungs every 6 (six) hours as needed for wheezing or shortness of breath.   ALPRAZolam 0.5 MG tablet Commonly known as: XANAX Take 0.5 mg by mouth at bedtime as needed for anxiety.   amiodarone 200 MG tablet Commonly known as: PACERONE TAKE 1 AND 1/2 TABLETS BY MOUTH DAILY What changed:  how much to take how to take this when to take this additional instructions   amLODipine 10 MG tablet Commonly known as: NORVASC Take 1 tablet (10 mg total) by mouth daily.   ascorbic acid 500 MG tablet Commonly known as: VITAMIN C Take 1 tablet (500 mg total) by  mouth daily.   aspirin 81 MG tablet Take 81 mg by mouth daily.   benzonatate 200 MG capsule Commonly known as: TESSALON Take 1 capsule (200 mg total) by mouth 3 (three) times daily as needed for cough.   cholecalciferol 25 MCG (1000 UNIT) tablet Commonly known as: VITAMIN D3 Take 1,000 Units by mouth daily.   omega-3 acid ethyl esters 1 g capsule Commonly known as: LOVAZA TAKE ONE CAPSULE BY MOUTH DAILY WITH FOOD   rOPINIRole 1 MG tablet Commonly known as: REQUIP Take 1 mg by mouth daily as needed (for restless legs).   tamsulosin 0.4 MG Caps capsule Commonly known as: FLOMAX Take 0.4 mg by mouth daily as needed (pt preference).   traZODone 50 MG tablet Commonly known as: DESYREL Take 1 tablet (50 mg total) by mouth at bedtime as needed for sleep.   vitamin B-12 1000 MCG tablet Commonly known as: CYANOCOBALAMIN Take 1,000 mcg by mouth daily.   zinc sulfate 220 (50 Zn) MG capsule Take 1 capsule (220 mg total) by mouth daily.       Allergies  Allergen Reactions   Codeine Nausea Only    Follow-up Information     Varadarajan,  Ronie Spies, MD. Call today.   Specialty: Internal Medicine Why: Please call for a posthospital follow-up appointment. Contact information: [address] [place] [address] [phone]         Troy Sine, MD .   Specialty: Cardiology Contact information: [address] [place] [address] [phone]                  The results of significant diagnostics from this hospitalization (including imaging, microbiology, ancillary and laboratory) are listed below for reference.    Significant Diagnostic Studies: CT CHEST WO CONTRAST  Result Date: 07/21/2021 CLINICAL DATA:  Respiratory illness, nondiagnostic x-ray. EXAM: CT CHEST WITHOUT CONTRAST TECHNIQUE: Multidetector CT imaging of the chest was performed following the standard protocol without IV contrast. COMPARISON:  Chest radiograph  07/13/2021.  Chest CT 12/11/2017 FINDINGS: Cardiovascular: Ascending thoracic aorta measures 3.7 cm. Atherosclerotic calcifications involving the thoracic aorta and coronary arteries. Heart size is normal. Minimal pericardial fluid. Mediastinum/Nodes: Prominent pretracheal lymph nodes. Index lymph node measures 8 mm on sequence 2 image 52 and previously measured 6 mm. Limited evaluation for hilar lymphadenopathy without intravascular contrast. No axillary lymph node enlargement. Lungs/Pleura: Evidence for a surgical staple line in the anterior right upper lung. Small bilateral pleural effusions, left side is larger than right. Pleural effusions are new. Again noted is biapical lung scarring with bilateral bronchiectasis in the upper lobes. In addition, there is bronchiectasis in the lower lobes particularly in the right lower lobe. New fluid or thickening along the right major fissure. Chronic pleural-based densities in the lateral right lower lobe are most compatible with areas of fibrosis and scarring. Slightly increased densities in the right lower lobe possibly related to atelectasis. Question interstitial thickening in the lower lungs but this is difficult evaluate due to motion artifact. Chronic peripheral densities in the anterior left upper lobe are suggestive for fibrotic changes. Upper Abdomen: Status post cholecystectomy. Diffusely increased density throughout the liver. This could be related to amiodarone use. Spleen is slightly prominent for size and similar to the previous examination. Musculoskeletal: Evidence for an old fracture involving the right twelfth rib. Disc space narrowing and endplate changes in the upper lumbar spine. IMPRESSION: 1. Small bilateral pleural effusions, left side larger than right. 2. Chronic bronchiectasis with evidence of scarring and fibrotic changes. Slightly increased densities at the lung bases likely related to compressive atelectasis. 3. New fluid or thickening  involving the right major fissure. 4.  Aortic Atherosclerosis (ICD10-I70.0). 5. Mediastinal lymph nodes have slightly enlarged since 2019. Findings are nonspecific but the patient does have a history of lymphoma. Electronically Signed   By: Markus Daft M.D.   On: 07/21/2021 11:15   DG Chest Port 1 View  Result Date: 07/21/2021 CLINICAL DATA:  Shortness of breath and cough. EXAM: PORTABLE CHEST 1 VIEW COMPARISON:  Chest radiograph dated 05/04/2020. FINDINGS: Background of emphysema. Right apical subpleural scarring. Bibasilar densities, likely atelectasis/scarring. Developing infiltrate is less likely. No focal consolidation, pleural effusion, pneumothorax. The cardiac silhouette is within normal limits. No acute osseous pathology. IMPRESSION: 1. No focal consolidation. 2. Emphysema. Electronically Signed   By: Anner Crete M.D.   On: 07/21/2021 02:40    Microbiology: Recent Results (from the past 240 hour(s))  Resp Panel by RT-PCR (Flu A&B, Covid) Nasopharyngeal Swab     Status: Abnormal   Collection Time: 07/21/21  2:03 AM   Specimen: Nasopharyngeal Swab; Nasopharyngeal(NP) swabs in vial transport medium  Result Value Ref Range Status  SARS Coronavirus 2 by RT PCR POSITIVE (A) NEGATIVE Final    Comment: (NOTE) SARS-CoV-2 target nucleic acids are DETECTED.  The SARS-CoV-2 RNA is generally detectable in upper respiratory specimens during the acute phase of infection. Positive results are indicative of the presence of the identified virus, but do not rule out bacterial infection or co-infection with other pathogens not detected by the test. Clinical correlation with patient history and other diagnostic information is necessary to determine patient infection status. The expected result is Negative.  Fact Sheet for Patients: EntrepreneurPulse.com.au  Fact Sheet for Healthcare Providers: IncredibleEmployment.be  This test is not yet approved or cleared  by the [place] FDA and  has been authorized for detection and/or diagnosis of SARS-CoV-2 by FDA under an Emergency Use Authorization (EUA).  This EUA will remain in effect (meaning this test can be used) for the duration of  the COVID-19 declaration under Section 564(b)(1) of the A ct, 21 U.S.C. section 360bbb-3(b)(1), unless the authorization is terminated or revoked sooner.     Influenza A by PCR NEGATIVE NEGATIVE Final   Influenza B by PCR NEGATIVE NEGATIVE Final    Comment: (NOTE) The Xpert Xpress SARS-CoV-2/FLU/RSV plus assay is intended as an aid in the diagnosis of influenza from Nasopharyngeal swab specimens and should not be used as a sole basis for treatment. Nasal washings and aspirates are unacceptable for Xpert Xpress SARS-CoV-2/FLU/RSV testing.  Fact Sheet for Patients: EntrepreneurPulse.com.au  Fact Sheet for Healthcare Providers: IncredibleEmployment.be  This test is not yet approved or cleared by the [place] FDA and has been authorized for detection and/or diagnosis of SARS-CoV-2 by FDA under an Emergency Use Authorization (EUA). This EUA will remain in effect (meaning this test can be used) for the duration of the COVID-19 declaration under Section 564(b)(1) of the Act, 21 U.S.C. section 360bbb-3(b)(1), unless the authorization is terminated or revoked.  Performed at [facility], [place] [address] [place], [place] [address]   Culture, blood (single)     Status: Abnormal   Collection Time: 07/21/21  3:20 AM   Specimen: BLOOD LEFT HAND  Result Value Ref Range Status   Specimen Description   Final    BLOOD LEFT HAND Performed at [facility] [address], [place], [place] [address]    Special Requests   Final    Blood Culture adequate volume BOTTLES DRAWN AEROBIC AND ANAEROBIC Performed at [facility] [address], [place], [place] [address]     Culture  Setup Time   Final    GRAM POSITIVE COCCI AEROBIC BOTTLE ONLY CRITICAL RESULT CALLED TO, READ BACK BY AND VERIFIED WITH: PHARMD ELLEN JACKSON 07/22/21@3 :41 BY TW IN BOTH AEROBIC AND ANAEROBIC BOTTLES    Culture (A)  Final    STAPHYLOCOCCUS CAPITIS STREPTOCOCCUS MITIS/ORALIS THE SIGNIFICANCE OF ISOLATING THIS ORGANISM FROM A SINGLE VENIPUNCTURE CANNOT BE PREDICTED WITHOUT FURTHER CLINICAL AND CULTURE CORRELATION. SUSCEPTIBILITIES AVAILABLE ONLY ON REQUEST. Performed at [facility], [place] [address], [place],  [address]    Report Status 07/24/2021 FINAL  Final  Blood Culture ID Panel (Reflexed)     Status: Abnormal   Collection Time: 07/21/21  3:20 AM  Result Value Ref Range Status   Enterococcus faecalis NOT DETECTED NOT DETECTED Final   Enterococcus Faecium NOT DETECTED NOT DETECTED Final   Listeria monocytogenes NOT DETECTED NOT DETECTED Final   Staphylococcus species DETECTED (A) NOT DETECTED Final    Comment: CRITICAL RESULT CALLED TO, READ BACK  BY AND VERIFIED WITH: PHARMD ELLEN JACKSON 07/22/21@3 :41 BY TW    Staphylococcus aureus (BCID) NOT DETECTED NOT DETECTED Final   Staphylococcus epidermidis NOT DETECTED NOT DETECTED Final   Staphylococcus lugdunensis NOT DETECTED NOT DETECTED Final   Streptococcus species DETECTED (A) NOT DETECTED Final    Comment: Not Enterococcus species, Streptococcus agalactiae, Streptococcus pyogenes, or Streptococcus pneumoniae. CRITICAL RESULT CALLED TO, READ BACK BY AND VERIFIED WITH: PHARMD ELLEN JACKSON 07/22/21@3 :41 BY TW    Streptococcus agalactiae NOT DETECTED NOT DETECTED Final   Streptococcus pneumoniae NOT DETECTED NOT DETECTED Final   Streptococcus pyogenes NOT DETECTED NOT DETECTED Final   A.calcoaceticus-baumannii NOT DETECTED NOT DETECTED Final   Bacteroides fragilis NOT DETECTED NOT DETECTED Final   Enterobacterales NOT DETECTED NOT DETECTED Final   Enterobacter cloacae complex NOT DETECTED NOT DETECTED Final    Escherichia coli NOT DETECTED NOT DETECTED Final   Klebsiella aerogenes NOT DETECTED NOT DETECTED Final   Klebsiella oxytoca NOT DETECTED NOT DETECTED Final   Klebsiella pneumoniae NOT DETECTED NOT DETECTED Final   Proteus species NOT DETECTED NOT DETECTED Final   Salmonella species NOT DETECTED NOT DETECTED Final   Serratia marcescens NOT DETECTED NOT DETECTED Final   Haemophilus influenzae NOT DETECTED NOT DETECTED Final   Neisseria meningitidis NOT DETECTED NOT DETECTED Final   Pseudomonas aeruginosa NOT DETECTED NOT DETECTED Final   Stenotrophomonas maltophilia NOT DETECTED NOT DETECTED Final   Candida albicans NOT DETECTED NOT DETECTED Final   Candida auris NOT DETECTED NOT DETECTED Final   Candida glabrata NOT DETECTED NOT DETECTED Final   Candida krusei NOT DETECTED NOT DETECTED Final   Candida parapsilosis NOT DETECTED NOT DETECTED Final   Candida tropicalis NOT DETECTED NOT DETECTED Final   Cryptococcus neoformans/gattii NOT DETECTED NOT DETECTED Final    Comment: Performed at [facility], [address], [place], [place] [address]  Expectorated Sputum Assessment w Gram Stain, Rflx to Resp Cult     Status: None   Collection Time: 07/22/21  6:00 PM   Specimen: Sputum  Result Value Ref Range Status   Specimen Description SPUTUM  Final   Special Requests NONE  Final   Sputum evaluation   Final    THIS SPECIMEN IS ACCEPTABLE FOR SPUTUM CULTURE Performed at [facility], [place] [address], [place], [place] [address]    Report Status 07/22/2021 FINAL  Final  Culture, Respiratory w Gram Stain     Status: None (Preliminary result)   Collection Time: 07/22/21  6:00 PM   Specimen: SPU  Result Value Ref Range Status   Specimen Description   Final    SPUTUM Performed at [facility] [address], [place], [place] [address]    Special Requests   Final    NONE Reflexed from [phone] Performed at [facility], [place]  [address], [place], [place] [address]    Gram Stain   Final    NO SQUAMOUS EPITHELIAL CELLS SEEN FEW WBC SEEN MODERATE GRAM POSITIVE COCCI    Culture   Final    CULTURE REINCUBATED FOR BETTER GROWTH Performed at [facility], [place] [address] [place], [place] [address]    Report Status PENDING  Incomplete  MRSA Next Gen by PCR, Nasal     Status: None   Collection Time: 07/22/21  7:07 PM   Specimen: Nasal Mucosa; Nasal Swab  Result Value Ref Range Status   MRSA by PCR Next Gen NOT DETECTED NOT DETECTED Final  Comment: (NOTE) The GeneXpert MRSA Assay (FDA approved for NASAL specimens only), is one component of a comprehensive MRSA colonization surveillance program. It is not intended to diagnose MRSA infection nor to guide or monitor treatment for MRSA infections. Test performance is not FDA approved in patients less than 25 years old. Performed at [facility], [place] [address], [place], [place] [address]   Culture, blood (routine x 2)     Status: None (Preliminary result)   Collection Time: 07/25/21  4:27 AM   Specimen: BLOOD  Result Value Ref Range Status   Specimen Description   Final    BLOOD LEFT ANTECUBITAL Performed at [facility] [address] [place], [place] [address]    Special Requests   Final    BOTTLES DRAWN AEROBIC ONLY Blood Culture adequate volume Performed at [facility] [address] [place], [place] [address]    Culture   Final    NO GROWTH < 12 HOURS Performed at [facility] [address] [place], [place] [address]    Report Status PENDING  Incomplete  Culture, blood (routine x 2)     Status: None (Preliminary result)   Collection Time: 07/25/21  4:27 AM   Specimen: BLOOD  Result Value Ref Range Status   Specimen Description   Final    BLOOD RIGHT ANTECUBITAL Performed at [facility] [address], [place], [place] [address]    Special Requests    Final    BOTTLES DRAWN AEROBIC ONLY Blood Culture adequate volume Performed at [facility] [address], [place], [place] [address]    Culture   Final    NO GROWTH < 12 HOURS Performed at [facility] [address] [place], [place] [address]    Report Status PENDING  Incomplete     Labs: Basic Metabolic Panel: Recent Labs  Lab 07/22/21 0401 07/23/21 0403 07/24/21 0443 07/25/21 0427 07/26/21 0406  NA 136 137 135 137 136  K 4.2 4.1 4.5 4.1 4.3  CL 107 102 100 103 104  CO2 24 25 29 27 26   GLUCOSE 144* 109* 138* 90 114*  BUN 22 32* 38* 36* 33*  CREATININE 0.92 1.13 1.33* 1.08 1.13  CALCIUM 8.7* 8.5* 8.7* 8.4* 8.2*  MG  --  2.1  --   --   --   PHOS  --  3.3  --   --   --    Liver Function Tests: Recent Labs  Lab 07/22/21 0401 07/23/21 0403 07/24/21 0443 07/25/21 0427 07/26/21 0406  AST 85* 97* 49* 36 32  ALT 79* 118* 97* 73* 68*  ALKPHOS 82 76 83 73 73  BILITOT 1.0 0.6 0.7 0.6 0.5  PROT 5.8* 6.0* 6.3* 5.7* 5.4*  ALBUMIN 3.4* 3.5 3.5 3.3* 3.1*   No results for input(s): LIPASE, AMYLASE in the last 168 hours. No results for input(s): AMMONIA in the last 168 hours. CBC: Recent Labs  Lab 07/22/21 0401 07/23/21 0403 07/24/21 0443 07/25/21 0427 07/26/21 0406  WBC 3.4* 8.1 7.1 6.0 5.3  NEUTROABS 2.8 6.6 6.3 4.1 4.2  HGB 10.3* 10.8* 12.0* 11.4* 11.0*  HCT 30.4* 32.1* 36.1* 34.9* 33.1*  MCV 99.7 99.4 98.6 100.6* 98.5  PLT 122* 156 176 153 159   Cardiac Enzymes: No results for input(s): CKTOTAL, CKMB, CKMBINDEX, TROPONINI in the last 168 hours. BNP: BNP (last 3 results) No results for input(s): BNP in the last 8760 hours.  ProBNP (last 3 results) No results for input(s): PROBNP in the last 8760 hours.  CBG: No results for input(s): GLUCAP in the last 168 hours.     Signed:  Kayleen Memos, MD [facility] 07/26/2021, 7:54 AM

## 2021-07-26 NOTE — Plan of Care (Signed)
  Problem: Education: Goal: Knowledge of General Education information will improve Description: Including pain rating scale, medication(s)/side effects and non-pharmacologic comfort measures Outcome: Progressing   Problem: Coping: Goal: Level of anxiety will decrease Outcome: Progressing   Problem: Pain Managment: Goal: General experience of comfort will improve Outcome: Progressing   Problem: Safety: Goal: Ability to remain free from injury will improve Outcome: Progressing   Problem: Skin Integrity: Goal: Risk for impaired skin integrity will decrease Outcome: Progressing   Problem: Coping: Goal: Psychosocial and spiritual needs will be supported Outcome: Progressing   Problem: Respiratory: Goal: Will maintain a patent airway Outcome: Progressing   

## 2021-07-26 NOTE — Progress Notes (Signed)
Physical Therapy Treatment Patient Details Name: Thomas Doyle MRN: [id number] DOB: 25-Jul-1929 Today's Date: 07/26/2021   History of Present Illness Thomas Doyle is a 88 y.o. male with medical history significant of Large B-cell lymphoma, CKD3a, PAF not on anticoagulation, COPD. Presenting with sore throat and a productive cough. Found to be COVID positive.    PT Comments    Pt AxO x 3 very motivated and eager to D/C to home today.  Feeling "much better".  Assisted with amb in hallway, pt tolerated well.   Recommendations for follow up therapy are one component of a multi-disciplinary discharge planning process, led by the attending physician.  Recommendations may be updated based on patient status, additional functional criteria and insurance authorization.  Follow Up Recommendations  Home health PT     Assistance Recommended at Discharge Intermittent Supervision/Assistance  Equipment Recommendations  None recommended by PT    Recommendations for Other Services       Precautions / Restrictions Precautions Precautions: Fall     Mobility  Bed Mobility Overal bed mobility: Modified Independent             General bed mobility comments: self able with increased time    Transfers Overall transfer level: Modified independent                 General transfer comment: self able with good use of hands to steady self    Ambulation/Gait Ambulation/Gait assistance: Supervision Gait Distance (Feet): 285 Feet Assistive device: Rolling walker (2 wheels) Gait Pattern/deviations: Step-through pattern Gait velocity: decreased     General Gait Details: tolerated an increased distance with good alternating gait and only light lean on walker.   Stairs             Wheelchair Mobility    Modified Rankin (Stroke Patients Only)       Balance                                            Cognition Arousal/Alertness: Awake/alert    Overall Cognitive Status: Within Functional Limits for tasks assessed                                 General Comments: AxO x 3 very pleasant and motivated Gentleman who lives with his daughter        Exercises      General Comments        Pertinent Vitals/Pain Pain Assessment: No/denies pain    Home Living                          Prior Function            PT Goals (current goals can now be found in the care plan section) Progress towards PT goals: Progressing toward goals    Frequency    Min 3X/week      PT Plan Current plan remains appropriate    Co-evaluation              AM-PAC PT "6 Clicks" Mobility   Outcome Measure  Help needed turning from your back to your side while in a flat bed without using bedrails?: None Help needed moving from lying on your back to sitting on the side of a flat bed  without using bedrails?: None Help needed moving to and from a bed to a chair (including a wheelchair)?: None Help needed standing up from a chair using your arms (e.g., wheelchair or bedside chair)?: None Help needed to walk in hospital room?: A Little Help needed climbing 3-5 steps with a railing? : A Little 6 Click Score: 22    End of Session Equipment Utilized During Treatment: Gait belt Activity Tolerance: Patient limited by fatigue Patient left: in chair;with call bell/phone within reach;with chair alarm set   PT Visit Diagnosis: History of falling (Z91.81);Other abnormalities of gait and mobility (R26.89)     Time: 5913-6859 PT Time Calculation (min) (ACUTE ONLY): 18 min  Charges:  $Gait Training: 8-22 mins                     Thomas Doyle  PTA Acute  Rehabilitation Services Pager      [phone] Office      [phone]

## 2021-07-30 LAB — CULTURE, BLOOD (ROUTINE X 2)
Culture: NO GROWTH
Culture: NO GROWTH
Special Requests: ADEQUATE
Special Requests: ADEQUATE

## 2021-11-16 ENCOUNTER — Telehealth: Payer: Self-pay

## 2021-11-16 NOTE — Telephone Encounter (Signed)
PC check in call by volunteer. Patient doing well, taking tylenol for pain as needed ?

## 2021-12-27 ENCOUNTER — Telehealth: Payer: Self-pay

## 2021-12-27 ENCOUNTER — Other Ambulatory Visit: Payer: Medicare Other

## 2021-12-27 DIAGNOSIS — Z515 Encounter for palliative care: Secondary | ICD-10-CM

## 2021-12-27 NOTE — Progress Notes (Signed)
COMMUNITY PALLIATIVE CARE SW NOTE  PATIENT NAME: Thomas Doyle DOB: Dec 03, 1928 MRN: [id number]  PRIMARY CARE PROVIDER: Leeroy Cha, MD  RESPONSIBLE PARTY:  Acct ID - Guarantor Home Phone Work Phone Relationship Acct Type  [id number] TRAVER, [phone]  Self P/F     [address], [place], [place] [address]    SOCIAL WORK TELEPHONIC (12:00 pm-12:17 pm)  PC SW completed a telephonic encounter with patient to obtain an update on his status and provide support. Patient stated "I'm sill here" when asked how he was doing. He report that he continues to have numbness all over that he has experienced for years. He report that he continues to have trips and fall due to the numbness. Patient had a fall about two weeks ago out in his yard where he was unable to get up. His neighbor helped him up. Patient did not sustain any injuries. Patient report that he is taking his medications just fine. He has no issues with his appetite and he has not loss or gained any weight. Patient verbalized appreciation for the call. He is open to ongoing palliative care support. He declined any immediate visit or follow-up. Palliative care team to follow-up with patient in 6-8 weeks to provide support and assess his status.      [address] [place], [place]

## 2021-12-27 NOTE — Telephone Encounter (Signed)
Volunteer check in call made for palliative care, no answer 

## 2022-01-09 ENCOUNTER — Emergency Department (HOSPITAL_COMMUNITY): Payer: Medicare Other

## 2022-01-09 ENCOUNTER — Encounter (HOSPITAL_COMMUNITY): Payer: Self-pay

## 2022-01-09 ENCOUNTER — Emergency Department (HOSPITAL_COMMUNITY)
Admission: EM | Admit: 2022-01-09 | Discharge: 2022-01-09 | Disposition: A | Discharge status: Home / Self Care | Payer: Medicare Other | Source: Home / Self Care | Attending: Emergency Medicine | Admitting: Emergency Medicine

## 2022-01-09 DIAGNOSIS — J189 Pneumonia, unspecified organism: Secondary | ICD-10-CM | POA: Diagnosis not present

## 2022-01-09 DIAGNOSIS — R197 Diarrhea, unspecified: Secondary | ICD-10-CM | POA: Insufficient documentation

## 2022-01-09 DIAGNOSIS — Z7982 Long term (current) use of aspirin: Secondary | ICD-10-CM | POA: Insufficient documentation

## 2022-01-09 DIAGNOSIS — J209 Acute bronchitis, unspecified: Secondary | ICD-10-CM | POA: Insufficient documentation

## 2022-01-09 DIAGNOSIS — R0902 Hypoxemia: Secondary | ICD-10-CM | POA: Diagnosis not present

## 2022-01-09 DIAGNOSIS — R7401 Elevation of levels of liver transaminase levels: Secondary | ICD-10-CM | POA: Insufficient documentation

## 2022-01-09 DIAGNOSIS — R531 Weakness: Secondary | ICD-10-CM | POA: Insufficient documentation

## 2022-01-09 DIAGNOSIS — D649 Anemia, unspecified: Secondary | ICD-10-CM | POA: Insufficient documentation

## 2022-01-09 LAB — URINALYSIS, ROUTINE W REFLEX MICROSCOPIC
Bilirubin Urine: NEGATIVE
Glucose, UA: NEGATIVE mg/dL
Hgb urine dipstick: NEGATIVE
Ketones, ur: NEGATIVE mg/dL
Leukocytes,Ua: NEGATIVE
Nitrite: NEGATIVE
Protein, ur: NEGATIVE mg/dL
Specific Gravity, Urine: 1.009 (ref 1.005–1.030)
pH: 5 (ref 5.0–8.0)

## 2022-01-09 LAB — CBC WITH DIFFERENTIAL/PLATELET
Abs Immature Granulocytes: 0.02 10*3/uL (ref 0.00–0.07)
Basophils Absolute: 0 10*3/uL (ref 0.0–0.1)
Basophils Relative: 1 %
Eosinophils Absolute: 0.2 10*3/uL (ref 0.0–0.5)
Eosinophils Relative: 3 %
HCT: 34.7 % — ABNORMAL LOW (ref 39.0–52.0)
Hemoglobin: 11.6 g/dL — ABNORMAL LOW (ref 13.0–17.0)
Immature Granulocytes: 0 %
Lymphocytes Relative: 9 %
Lymphs Abs: 0.5 10*3/uL — ABNORMAL LOW (ref 0.7–4.0)
MCH: 33.2 pg (ref 26.0–34.0)
MCHC: 33.4 g/dL (ref 30.0–36.0)
MCV: 99.4 fL (ref 80.0–100.0)
Monocytes Absolute: 0.6 10*3/uL (ref 0.1–1.0)
Monocytes Relative: 9 %
Neutro Abs: 4.8 10*3/uL (ref 1.7–7.7)
Neutrophils Relative %: 78 %
Platelets: 202 10*3/uL (ref 150–400)
RBC: 3.49 MIL/uL — ABNORMAL LOW (ref 4.22–5.81)
RDW: 13.3 % (ref 11.5–15.5)
WBC: 6.2 10*3/uL (ref 4.0–10.5)
nRBC: 0 % (ref 0.0–0.2)

## 2022-01-09 LAB — COMPREHENSIVE METABOLIC PANEL
ALT: 47 U/L — ABNORMAL HIGH (ref 0–44)
AST: 54 U/L — ABNORMAL HIGH (ref 15–41)
Albumin: 3.6 g/dL (ref 3.5–5.0)
Alkaline Phosphatase: 108 U/L (ref 38–126)
Anion gap: 8 (ref 5–15)
BUN: 15 mg/dL (ref 8–23)
CO2: 24 mmol/L (ref 22–32)
Calcium: 8.5 mg/dL — ABNORMAL LOW (ref 8.9–10.3)
Chloride: 103 mmol/L (ref 98–111)
Creatinine, Ser: 1.15 mg/dL (ref 0.61–1.24)
GFR, Estimated: >60 mL/min (ref >60)
Glucose, Bld: 110 mg/dL — ABNORMAL HIGH (ref 70–99)
Potassium: 4.3 mmol/L (ref 3.5–5.1)
Sodium: 135 mmol/L (ref 135–145)
Total Bilirubin: 1.4 mg/dL — ABNORMAL HIGH (ref 0.3–1.2)
Total Protein: 6.4 g/dL — ABNORMAL LOW (ref 6.5–8.1)

## 2022-01-09 LAB — LACTIC ACID, PLASMA: Lactic Acid, Venous: 1.5 mmol/L (ref 0.5–1.9)

## 2022-01-09 MED ORDER — AMOXICILLIN-POT CLAVULANATE 875-125 MG PO TABS
1.0000 | ORAL_TABLET | Freq: Once | ORAL | Status: AC
  Administered 2022-01-09: 1 via ORAL
  Filled 2022-01-09: qty 1

## 2022-01-09 MED ORDER — KETOROLAC TROMETHAMINE 30 MG/ML IJ SOLN
15.0000 mg | Freq: Once | INTRAMUSCULAR | Status: AC
  Administered 2022-01-09: 15 mg via INTRAVENOUS
  Filled 2022-01-09: qty 1

## 2022-01-09 MED ORDER — LACTATED RINGERS IV BOLUS
1000.0000 mL | Freq: Once | INTRAVENOUS | Status: AC
  Administered 2022-01-09: 1000 mL via INTRAVENOUS

## 2022-01-09 MED ORDER — AMOXICILLIN-POT CLAVULANATE 875-125 MG PO TABS: 1.0000 | ORAL_TABLET | Freq: Two times a day (BID) | ORAL | 0 refills | Status: AC

## 2022-01-09 MED ORDER — SODIUM CHLORIDE 0.9 % IV SOLN: INTRAVENOUS | Status: DC

## 2022-01-09 MED ORDER — SODIUM CHLORIDE 0.9 % IV BOLUS
500.0000 mL | Freq: Once | INTRAVENOUS | Status: AC
  Administered 2022-01-09: 500 mL via INTRAVENOUS

## 2022-01-09 MED ORDER — GUAIFENESIN-DM 100-10 MG/5ML PO SYRP
5.0000 mL | ORAL_SOLUTION | ORAL | Status: DC | PRN
  Administered 2022-01-09: 5 mL via ORAL
  Filled 2022-01-09: qty 10

## 2022-01-09 NOTE — Discharge Instructions (Signed)
It is important to drink plenty of fluids, and include something like Ensure 2 or 3 times a day.  Try to eat some food.  Do not eat high fiber food as this can make diarrhea worse.  It is best to stay on a low fiber diet.  See the attached instructions.  Use Robitussin-DM, every 4-6 hours as needed for cough . Start the antibiotic prescription that was sent to your pharmacy.  Call your doctor for a follow-up appointment in 2 or 3 days.

## 2022-01-09 NOTE — ED Notes (Signed)
Patient ambulated in the room with no assistance.

## 2022-01-09 NOTE — ED Notes (Signed)
Patient transported to X-ray 

## 2022-01-09 NOTE — ED Notes (Signed)
Patient given water

## 2022-01-09 NOTE — ED Provider Notes (Incomplete)
  Face-to-face evaluation   History: Patient presents for evaluation of cough productive of brown sputum.  This tends to occur when he takes deep breaths or talks a lot.  He also has ongoing diarrhea for which she is taking an over-the-counter antidiarrheal medication.  He injured his abdomen in the fall, 2 weeks ago.  Currently being monitored, but not being treated, for stage IV cancer.  He is being followed by palliative care services, who checked in with him by telephone, about 2 weeks ago he has ongoing numbness of his lower extremities which tend to cause him to fall.  Physical exam: Alert, calm, cooperative.  No dysarthria or aphasia.  He is a fairly good historian.  Lungs have somewhat decreased air movement bilaterally with scattered rhonchi.  No audible rails.  No increased work of breathing.  Heart regular rate and rhythm without murmur.  Abdomen has notable small bruise of the left upper quadrant region.  The abdomen itself is nontender to palpation.  There is no palpable mass or deformity.  Chest mild left anterior tenderness without crepitation or deformity.  MDM: Evaluation for No chief complaint on file.    Patient currently being managed as palliative, with history of B-cell lymphoma and COPD.  Presenting primarily with cough, and ongoing chronic diarrhea.  Transferred here by EMS, who started him on oxygen.  Patient states it made his cough get better.  It is not clear if he has an oxygen deficit.  He does not currently use oxygen at home.  Medical screening examination/treatment/procedure(s) were conducted as a shared visit with non-physician practitioner(s) and myself.  I personally evaluated the patient during the encounter

## 2022-01-09 NOTE — ED Triage Notes (Signed)
Patient coming from home with c/o nausea, vomiting and diarrhea for about 1 week. Pt report trying OTC with no relief. Pt has hx of stage 4 cancer. Not currently on treatment.

## 2022-01-09 NOTE — ED Provider Notes (Signed)
[facility] DEPT Provider Note   CSN: [id number] Arrival date & time: 01/09/22  3546     History  No chief complaint on file.   Thomas Doyle is a 88 y.o. male.  HPI History: Patient presents for evaluation of cough productive of brown sputum.  This tends to occur when he takes deep breaths or talks a lot.  He also has ongoing diarrhea for which she is taking an over-the-counter antidiarrheal medication.  He injured his abdomen in the fall, 2 weeks ago.  Currently being monitored, but not being treated, for stage IV cancer.  He is being followed by palliative care services, who checked in with him by telephone, about 2 weeks ago he has ongoing numbness of his lower extremities which tend to cause him to fall.    Home Medications Prior to Admission medications   Medication Sig Start Date End Date Taking? Authorizing Provider  acetaminophen (TYLENOL) 500 MG tablet Take 1,000 mg by mouth daily as needed (pain).   Yes [provider]  albuterol (VENTOLIN HFA) 108 (90 Base) MCG/ACT inhaler Inhale 2 puffs into the lungs every 6 (six) hours as needed for wheezing or shortness of breath. 07/25/21  Yes Hall, Carole N, DO  ALPRAZolam Duanne Moron) 0.5 MG tablet Take 0.5 mg by mouth at bedtime.   Yes [provider]  amiodarone (PACERONE) 200 MG tablet TAKE 1 AND 1/2 TABLETS BY MOUTH DAILY Patient taking differently: Take 300 mg by mouth daily. Taking 1 & 1/2 tab daily = 300 mg 09/07/20  Yes Troy Sine, MD  amLODipine (NORVASC) 10 MG tablet Take 1 tablet (10 mg total) by mouth daily. 07/12/21  Yes Troy Sine, MD  amoxicillin-clavulanate (AUGMENTIN) 875-125 MG tablet Take 1 tablet by mouth every 12 (twelve) hours. 01/09/22  Yes Daleen Bo, MD  aspirin EC 81 MG tablet Take 81 mg by mouth daily. Swallow whole.   Yes [provider]  benzonatate (TESSALON) 200 MG capsule Take 1 capsule (200 mg total) by mouth 3 (three) times daily as needed  for cough. 07/25/21  Yes Kayleen Memos, DO  bismuth subsalicylate (PEPTO BISMOL) 262 MG/15ML suspension Take by mouth daily as needed for indigestion (acid reflux).   Yes [provider]  Cholecalciferol (VITAMIN D3) 75 MCG (3000 UT) TABS Take 3,000 Units by mouth daily.   Yes [provider]  Cyanocobalamin 2500 MCG TABS Take 2,500 mcg by mouth daily. Vitamin B12   Yes [provider]  loperamide (IMODIUM) 2 MG capsule Take 2 mg by mouth daily as needed for diarrhea or loose stools.   Yes [provider]  Omega-3 Fatty Acids (FISH OIL PO) Take 1 capsule by mouth daily.   Yes [provider]  polyvinyl alcohol (ARTIFICIAL TEARS) 1.4 % ophthalmic solution Place 1 drop into both eyes daily as needed for dry eyes.   Yes [provider]  Probiotic Product (PROBIOTIC PO) Take 1 tablet by mouth daily.   Yes [provider]  promethazine (PHENERGAN) 25 MG tablet Take 25 mg by mouth 2 (two) times daily as needed for nausea or vomiting.   Yes [provider]  rOPINIRole (REQUIP) 1 MG tablet Take 1 mg by mouth at bedtime.   Yes [provider]  tamsulosin (FLOMAX) 0.4 MG CAPS capsule Take 0.4 mg by mouth at bedtime as needed (infrequent urination). 11/11/17  Yes [provider]      Allergies    Codeine  Review of Systems   Review of Systems  Physical Exam Updated Vital Signs BP (!) 125/53   Pulse 62   Temp 99.4 F (37.4 C) (Oral)   Resp 17   Ht '5\' 10"'$  (1.778 m)   Wt 65.8 kg   SpO2 91%   BMI 20.81 kg/m  Physical Exam Vitals and nursing note reviewed.  Constitutional:      General: He is not in acute distress.    Appearance: He is well-developed. He is not ill-appearing or diaphoretic.  HENT:     Head: Normocephalic and atraumatic.     Right Ear: External ear normal.     Left Ear: External ear normal.  Eyes:     Conjunctiva/sclera: Conjunctivae normal.     Pupils: Pupils are equal, round, and  reactive to light.  Neck:     Trachea: Phonation normal.  Cardiovascular:     Rate and Rhythm: Normal rate and regular rhythm.     Heart sounds: Normal heart sounds.  Pulmonary:     Effort: Pulmonary effort is normal. No respiratory distress.     Breath sounds: No stridor. Rhonchi present.  Chest:     Chest wall: No tenderness.  Abdominal:     General: There is no distension.     Palpations: Abdomen is soft. There is no mass.     Tenderness: There is no abdominal tenderness.  Musculoskeletal:        General: Normal range of motion.     Cervical back: Normal range of motion and neck supple.  Skin:    General: Skin is warm and dry.  Neurological:     Mental Status: He is alert and oriented to person, place, and time.     Cranial Nerves: No cranial nerve deficit.     Sensory: No sensory deficit.     Motor: No abnormal muscle tone.     Coordination: Coordination normal.  Psychiatric:        Mood and Affect: Mood normal.        Behavior: Behavior normal.     ED Results / Procedures / Treatments   Labs (all labs ordered are listed, but only abnormal results are displayed) Labs Reviewed  COMPREHENSIVE METABOLIC PANEL - Abnormal; Notable for the following components:      Result Value   Glucose, Bld 110 (*)    Calcium 8.5 (*)    Total Protein 6.4 (*)    AST 54 (*)    ALT 47 (*)    Total Bilirubin 1.4 (*)    All other components within normal limits  CBC WITH DIFFERENTIAL/PLATELET - Abnormal; Notable for the following components:   RBC 3.49 (*)    Hemoglobin 11.6 (*)    HCT 34.7 (*)    Lymphs Abs 0.5 (*)    All other components within normal limits  LACTIC ACID, PLASMA  URINALYSIS, ROUTINE W REFLEX MICROSCOPIC    EKG None  Radiology DG Chest 2 View  Result Date: 01/09/2022 CLINICAL DATA:  Chest pain. Nausea and vomiting. Large B-cell lymphoma. EXAM: CHEST - 2 VIEW COMPARISON:  07/13/2021 FINDINGS: Heart size is within normal limits. Scarring is again seen in the  right lung base. Increased diffuse interstitial prominence is suspicious for mild interstitial edema. No evidence of parenchymal consolidation or pleural effusion. IMPRESSION: Increased diffuse interstitial prominence, suspicious for pulmonary interstitial edema. Electronically Signed   By: Marlaine Hind M.D.   On: 01/09/2022 11:54   DG Thoracic Spine 2 View  Result  Date: 01/09/2022 CLINICAL DATA:  Thoracic back pain. EXAM: THORACIC SPINE 2 VIEWS COMPARISON:  None Available. FINDINGS: There is no evidence of thoracic spine fracture. Alignment is normal. No focal lytic or sclerotic bone lesions identified. Mild thoracic dextroscoliosis noted. Severe degenerative disc disease noted in the lower cervical and upper lumbar spine. IMPRESSION: No acute findings. Severe lower cervical and upper lumbar degenerative disc disease. Electronically Signed   By: Marlaine Hind M.D.   On: 01/09/2022 11:52   DG Ribs Bilateral  Result Date: 01/09/2022 CLINICAL DATA:  Bilateral rib and chest wall pain. EXAM: BILATERAL RIBS - 3+ VIEW COMPARISON:  None Available. FINDINGS: No acute fracture seen involving the ribs. Old fracture deformities are seen involving the right anterolateral 8th and 9th ribs. Other bone lesions identified. IMPRESSION: No acute findings. Old fracture deformities of right anterolateral 8th and 9th ribs. Electronically Signed   By: Marlaine Hind M.D.   On: 01/09/2022 11:51    Procedures Procedures    Medications Ordered in ED Medications  0.9 %  sodium chloride infusion (has no administration in time range)  guaiFENesin-dextromethorphan (ROBITUSSIN DM) 100-10 MG/5ML syrup 5 mL (5 mLs Oral Given 01/09/22 1127)  lactated ringers bolus 1,000 mL (1,000 mLs Intravenous New Bag/Given 01/09/22 1123)  sodium chloride 0.9 % bolus 500 mL (500 mLs Intravenous New Bag/Given 01/09/22 1529)  ketorolac (TORADOL) 30 MG/ML injection 15 mg (15 mg Intravenous Given 01/09/22 1126)  amoxicillin-clavulanate (AUGMENTIN)  875-125 MG per tablet 1 tablet (1 tablet Oral Given 01/09/22 1530)  sodium chloride 0.9 % bolus 500 mL (500 mLs Intravenous New Bag/Given 01/09/22 1530)    ED Course/ Medical Decision Making/ A&P                           Medical Decision Making Patient presenting for cough with sputum production for 2 weeks.  He is generally weak.  He reports having frequent stools after eating, 1 or 2 a day.  He is using various over-the-counter antidiarrheal medications without relief of that.  He denies fever.  He feels generally weak and tired.  Amount and/or Complexity of Data Reviewed Independent Historian:     Details: He is a cogent historian.  I also talked to his daughter Thayer Headings by telephone at 3:50 PM.  She did not offer any additional history but confirmed the patient's history.  She will come and  get the patient. Labs: ordered.    Details: CBC, metabolic panel, lactate, urinalysis-normal except glucose high, calcium low, total protein low, AST high, ALT high, total bilirubin high, hemoglobin low Radiology: ordered and independent interpretation performed.    Details: Chest x-ray-no infiltrate or edema  Risk OTC drugs. Prescription drug management. Decision regarding hospitalization. Risk Details: Elderly patient, evaluating for nonspecific symptoms including cough and weakness.  He feels better after treatment with IV fluids.  He was given Augmentin for possible bronchitis.  Laboratory evaluation is remarkable for mild nonspecific transaminitis, with ongoing poor nutrition evidenced by low protein.  Doubt sepsis, pneumonia or infectious diarrhea.  Patient felt better after IV fluids.           Final Clinical Impression(s) / ED Diagnoses Final diagnoses:  Acute bronchitis, unspecified organism  Weakness  Diarrhea, unspecified type    Rx / DC Orders ED Discharge Orders          Ordered    amoxicillin-clavulanate (AUGMENTIN) 875-125 MG tablet  Every 12 hours  01/09/22 1551               Daleen Bo, MD 01/09/22 1554

## 2022-01-10 ENCOUNTER — Encounter (HOSPITAL_COMMUNITY): Payer: Self-pay | Admitting: Oncology

## 2022-01-10 ENCOUNTER — Inpatient Hospital Stay (HOSPITAL_COMMUNITY)
Admission: EM | Admit: 2022-01-10 | Discharge: 2022-01-22 | DRG: 189 | Disposition: E | Payer: Medicare Other | Attending: Internal Medicine | Admitting: Internal Medicine

## 2022-01-10 ENCOUNTER — Emergency Department (HOSPITAL_COMMUNITY): Payer: Medicare Other

## 2022-01-10 ENCOUNTER — Other Ambulatory Visit: Payer: Self-pay

## 2022-01-10 DIAGNOSIS — C8510 Unspecified B-cell lymphoma, unspecified site: Secondary | ICD-10-CM | POA: Diagnosis present

## 2022-01-10 DIAGNOSIS — Z79899 Other long term (current) drug therapy: Secondary | ICD-10-CM

## 2022-01-10 DIAGNOSIS — Z9049 Acquired absence of other specified parts of digestive tract: Secondary | ICD-10-CM

## 2022-01-10 DIAGNOSIS — Z885 Allergy status to narcotic agent status: Secondary | ICD-10-CM | POA: Diagnosis not present

## 2022-01-10 DIAGNOSIS — R112 Nausea with vomiting, unspecified: Secondary | ICD-10-CM | POA: Diagnosis present

## 2022-01-10 DIAGNOSIS — Z515 Encounter for palliative care: Secondary | ICD-10-CM | POA: Diagnosis not present

## 2022-01-10 DIAGNOSIS — D649 Anemia, unspecified: Secondary | ICD-10-CM | POA: Diagnosis present

## 2022-01-10 DIAGNOSIS — I10 Essential (primary) hypertension: Secondary | ICD-10-CM | POA: Diagnosis present

## 2022-01-10 DIAGNOSIS — J189 Pneumonia, unspecified organism: Secondary | ICD-10-CM | POA: Diagnosis present

## 2022-01-10 DIAGNOSIS — I110 Hypertensive heart disease with heart failure: Secondary | ICD-10-CM | POA: Diagnosis present

## 2022-01-10 DIAGNOSIS — I480 Paroxysmal atrial fibrillation: Secondary | ICD-10-CM | POA: Diagnosis present

## 2022-01-10 DIAGNOSIS — Z8249 Family history of ischemic heart disease and other diseases of the circulatory system: Secondary | ICD-10-CM | POA: Diagnosis not present

## 2022-01-10 DIAGNOSIS — U099 Post covid-19 condition, unspecified: Secondary | ICD-10-CM | POA: Diagnosis present

## 2022-01-10 DIAGNOSIS — J209 Acute bronchitis, unspecified: Secondary | ICD-10-CM | POA: Diagnosis present

## 2022-01-10 DIAGNOSIS — I714 Abdominal aortic aneurysm, without rupture, unspecified: Secondary | ICD-10-CM | POA: Diagnosis present

## 2022-01-10 DIAGNOSIS — J9601 Acute respiratory failure with hypoxia: Secondary | ICD-10-CM | POA: Diagnosis present

## 2022-01-10 DIAGNOSIS — R0902 Hypoxemia: Principal | ICD-10-CM

## 2022-01-10 DIAGNOSIS — G629 Polyneuropathy, unspecified: Secondary | ICD-10-CM | POA: Diagnosis present

## 2022-01-10 DIAGNOSIS — R202 Paresthesia of skin: Secondary | ICD-10-CM | POA: Diagnosis present

## 2022-01-10 DIAGNOSIS — J440 Chronic obstructive pulmonary disease with acute lower respiratory infection: Secondary | ICD-10-CM | POA: Diagnosis present

## 2022-01-10 DIAGNOSIS — D630 Anemia in neoplastic disease: Secondary | ICD-10-CM | POA: Diagnosis present

## 2022-01-10 DIAGNOSIS — Z20822 Contact with and (suspected) exposure to covid-19: Secondary | ICD-10-CM | POA: Diagnosis present

## 2022-01-10 DIAGNOSIS — X58XXXA Exposure to other specified factors, initial encounter: Secondary | ICD-10-CM | POA: Diagnosis present

## 2022-01-10 DIAGNOSIS — R7989 Other specified abnormal findings of blood chemistry: Secondary | ICD-10-CM | POA: Diagnosis present

## 2022-01-10 DIAGNOSIS — I5031 Acute diastolic (congestive) heart failure: Secondary | ICD-10-CM | POA: Diagnosis present

## 2022-01-10 DIAGNOSIS — Z7982 Long term (current) use of aspirin: Secondary | ICD-10-CM

## 2022-01-10 DIAGNOSIS — Z7189 Other specified counseling: Secondary | ICD-10-CM | POA: Diagnosis not present

## 2022-01-10 DIAGNOSIS — Z66 Do not resuscitate: Secondary | ICD-10-CM | POA: Diagnosis present

## 2022-01-10 DIAGNOSIS — R609 Edema, unspecified: Secondary | ICD-10-CM | POA: Diagnosis not present

## 2022-01-10 DIAGNOSIS — R0609 Other forms of dyspnea: Secondary | ICD-10-CM | POA: Diagnosis not present

## 2022-01-10 DIAGNOSIS — T451X5A Adverse effect of antineoplastic and immunosuppressive drugs, initial encounter: Secondary | ICD-10-CM | POA: Diagnosis present

## 2022-01-10 LAB — LACTIC ACID, PLASMA
Lactic Acid, Venous: 1.1 mmol/L (ref 0.5–1.9)
Lactic Acid, Venous: 1.2 mmol/L (ref 0.5–1.9)

## 2022-01-10 LAB — CBC WITH DIFFERENTIAL/PLATELET
Abs Immature Granulocytes: 0.02 10*3/uL (ref 0.00–0.07)
Basophils Absolute: 0 10*3/uL (ref 0.0–0.1)
Basophils Relative: 0 %
Eosinophils Absolute: 0.1 10*3/uL (ref 0.0–0.5)
Eosinophils Relative: 1 %
HCT: 34 % — ABNORMAL LOW (ref 39.0–52.0)
Hemoglobin: 11.2 g/dL — ABNORMAL LOW (ref 13.0–17.0)
Immature Granulocytes: 0 %
Lymphocytes Relative: 8 %
Lymphs Abs: 0.5 10*3/uL — ABNORMAL LOW (ref 0.7–4.0)
MCH: 32.9 pg (ref 26.0–34.0)
MCHC: 32.9 g/dL (ref 30.0–36.0)
MCV: 100 fL (ref 80.0–100.0)
Monocytes Absolute: 0.5 10*3/uL (ref 0.1–1.0)
Monocytes Relative: 8 %
Neutro Abs: 5.1 10*3/uL (ref 1.7–7.7)
Neutrophils Relative %: 83 %
Platelets: 207 10*3/uL (ref 150–400)
RBC: 3.4 MIL/uL — ABNORMAL LOW (ref 4.22–5.81)
RDW: 13.5 % (ref 11.5–15.5)
WBC: 6.2 10*3/uL (ref 4.0–10.5)
nRBC: 0 % (ref 0.0–0.2)

## 2022-01-10 LAB — COMPREHENSIVE METABOLIC PANEL
ALT: 41 U/L (ref 0–44)
AST: 48 U/L — ABNORMAL HIGH (ref 15–41)
Albumin: 3.2 g/dL — ABNORMAL LOW (ref 3.5–5.0)
Alkaline Phosphatase: 100 U/L (ref 38–126)
Anion gap: 8 (ref 5–15)
BUN: 22 mg/dL (ref 8–23)
CO2: 21 mmol/L — ABNORMAL LOW (ref 22–32)
Calcium: 8 mg/dL — ABNORMAL LOW (ref 8.9–10.3)
Chloride: 105 mmol/L (ref 98–111)
Creatinine, Ser: 1.11 mg/dL (ref 0.61–1.24)
GFR, Estimated: >60 mL/min (ref >60)
Glucose, Bld: 118 mg/dL — ABNORMAL HIGH (ref 70–99)
Potassium: 3.9 mmol/L (ref 3.5–5.1)
Sodium: 134 mmol/L — ABNORMAL LOW (ref 135–145)
Total Bilirubin: 1.6 mg/dL — ABNORMAL HIGH (ref 0.3–1.2)
Total Protein: 5.9 g/dL — ABNORMAL LOW (ref 6.5–8.1)

## 2022-01-10 LAB — RESP PANEL BY RT-PCR (FLU A&B, COVID) ARPGX2
Influenza A by PCR: NEGATIVE
Influenza B by PCR: NEGATIVE
SARS Coronavirus 2 by RT PCR: NEGATIVE

## 2022-01-10 LAB — BRAIN NATRIURETIC PEPTIDE: B Natriuretic Peptide: 294.4 pg/mL — ABNORMAL HIGH (ref 0.0–100.0)

## 2022-01-10 MED ORDER — AMIODARONE HCL 200 MG PO TABS
300.0000 mg | ORAL_TABLET | Freq: Every day | ORAL | Status: DC
  Administered 2022-01-11 – 2022-01-15 (×5): 300 mg via ORAL
  Filled 2022-01-10 (×5): qty 1

## 2022-01-10 MED ORDER — IPRATROPIUM-ALBUTEROL 0.5-2.5 (3) MG/3ML IN SOLN
3.0000 mL | Freq: Four times a day (QID) | RESPIRATORY_TRACT | Status: DC | PRN
Start: 2022-01-10 — End: 2022-01-12
  Administered 2022-01-11 – 2022-01-12 (×2): 3 mL via RESPIRATORY_TRACT
  Filled 2022-01-10 (×2): qty 3

## 2022-01-10 MED ORDER — TAMSULOSIN HCL 0.4 MG PO CAPS: 0.4000 mg | ORAL_CAPSULE | Freq: Every evening | ORAL | Status: DC | PRN

## 2022-01-10 MED ORDER — METHYLPREDNISOLONE SODIUM SUCC 40 MG IJ SOLR
40.0000 mg | Freq: Once | INTRAMUSCULAR | Status: AC
  Administered 2022-01-11: 40 mg via INTRAVENOUS
  Filled 2022-01-10: qty 1

## 2022-01-10 MED ORDER — AMLODIPINE BESYLATE 10 MG PO TABS
10.0000 mg | ORAL_TABLET | Freq: Every day | ORAL | Status: DC
  Administered 2022-01-11 – 2022-01-15 (×5): 10 mg via ORAL
  Filled 2022-01-10 (×2): qty 1
  Filled 2022-01-10: qty 2
  Filled 2022-01-10 (×2): qty 1

## 2022-01-10 MED ORDER — FUROSEMIDE 10 MG/ML IJ SOLN
20.0000 mg | Freq: Once | INTRAMUSCULAR | Status: AC
  Administered 2022-01-10: 20 mg via INTRAVENOUS
  Filled 2022-01-10: qty 4

## 2022-01-10 MED ORDER — OMEGA-3-ACID ETHYL ESTERS 1 G PO CAPS
1.0000 g | ORAL_CAPSULE | Freq: Every day | ORAL | Status: DC
  Administered 2022-01-11 – 2022-01-15 (×5): 1 g via ORAL
  Filled 2022-01-10 (×5): qty 1

## 2022-01-10 MED ORDER — SODIUM CHLORIDE 0.9 % IV SOLN
2.0000 g | Freq: Once | INTRAVENOUS | Status: AC
  Administered 2022-01-10: 2 g via INTRAVENOUS
  Filled 2022-01-10: qty 20

## 2022-01-10 MED ORDER — SODIUM CHLORIDE 0.9 % IV SOLN
500.0000 mg | Freq: Once | INTRAVENOUS | Status: AC
  Administered 2022-01-10: 500 mg via INTRAVENOUS
  Filled 2022-01-10: qty 5

## 2022-01-10 MED ORDER — ROPINIROLE HCL 1 MG PO TABS
1.0000 mg | ORAL_TABLET | Freq: Every day | ORAL | Status: DC
  Administered 2022-01-11 – 2022-01-16 (×7): 1 mg via ORAL
  Filled 2022-01-10 (×7): qty 1

## 2022-01-10 MED ORDER — ENOXAPARIN SODIUM 40 MG/0.4ML IJ SOSY
40.0000 mg | PREFILLED_SYRINGE | INTRAMUSCULAR | Status: DC
  Administered 2022-01-11 – 2022-01-16 (×6): 40 mg via SUBCUTANEOUS
  Filled 2022-01-10 (×6): qty 0.4

## 2022-01-10 MED ORDER — ACETAMINOPHEN 500 MG PO TABS
1000.0000 mg | ORAL_TABLET | Freq: Every day | ORAL | Status: DC | PRN
  Administered 2022-01-16: 1000 mg via ORAL
  Filled 2022-01-10 (×3): qty 2

## 2022-01-10 MED ORDER — SODIUM CHLORIDE 0.9 % IV SOLN
1.0000 g | INTRAVENOUS | Status: DC
  Administered 2022-01-11 – 2022-01-12 (×2): 1 g via INTRAVENOUS
  Filled 2022-01-10 (×3): qty 10

## 2022-01-10 MED ORDER — IPRATROPIUM-ALBUTEROL 0.5-2.5 (3) MG/3ML IN SOLN
3.0000 mL | Freq: Once | RESPIRATORY_TRACT | Status: AC
  Administered 2022-01-10: 3 mL via RESPIRATORY_TRACT
  Filled 2022-01-10: qty 3

## 2022-01-10 MED ORDER — ALPRAZOLAM 0.5 MG PO TABS
0.5000 mg | ORAL_TABLET | Freq: Every day | ORAL | Status: DC
  Administered 2022-01-11 – 2022-01-16 (×7): 0.5 mg via ORAL
  Filled 2022-01-10 (×7): qty 1

## 2022-01-10 MED ORDER — HYDROCOD POLI-CHLORPHE POLI ER 10-8 MG/5ML PO SUER
5.0000 mL | Freq: Two times a day (BID) | ORAL | Status: DC
  Administered 2022-01-10 – 2022-01-16 (×13): 5 mL via ORAL
  Filled 2022-01-10 (×13): qty 5

## 2022-01-10 MED ORDER — CYANOCOBALAMIN 500 MCG PO TABS
2500.0000 ug | ORAL_TABLET | Freq: Every day | ORAL | Status: DC
Start: 2022-01-11 — End: 2022-01-17
  Administered 2022-01-11 – 2022-01-15 (×5): 2500 ug via ORAL
  Filled 2022-01-10 (×7): qty 5

## 2022-01-10 MED ORDER — SODIUM CHLORIDE 0.9 % IV SOLN
500.0000 mg | INTRAVENOUS | Status: DC
  Administered 2022-01-12 – 2022-01-16 (×6): 500 mg via INTRAVENOUS
  Filled 2022-01-10 (×6): qty 5

## 2022-01-10 MED ORDER — BISMUTH SUBSALICYLATE 262 MG/15ML PO SUSP: 30.0000 mL | ORAL | Status: DC | PRN

## 2022-01-10 MED ORDER — ASPIRIN 81 MG PO TBEC
81.0000 mg | DELAYED_RELEASE_TABLET | Freq: Every day | ORAL | Status: DC
  Administered 2022-01-11 – 2022-01-15 (×5): 81 mg via ORAL
  Filled 2022-01-10 (×5): qty 1

## 2022-01-10 MED ORDER — VITAMIN D 25 MCG (1000 UNIT) PO TABS
3000.0000 [IU] | ORAL_TABLET | Freq: Every day | ORAL | Status: DC
  Administered 2022-01-11 – 2022-01-15 (×5): 3000 [IU] via ORAL
  Filled 2022-01-10 (×5): qty 3

## 2022-01-10 NOTE — Assessment & Plan Note (Addendum)
Questionable pneumonia/COPD exacerbation vs new onset CHF -CXR with bibasilar infiltrate versus pulmonary edema.  He was evaluated in ED yesterday with chest x-ray suggestive of more pulmonary edema.  BNP unequivocal at 294.  He does report some increased lower extremity edema although this is not appreciated on exam. -Will trial 20 mg IV Lasix overnight and follow intake and output -Obtain echocardiogram.  He has history of mitral valve prolapse seen on echo in 2013. -Continue IV Rocephin and azithromycin for now pending procalcitonin -one time dose of IV solu medrol

## 2022-01-10 NOTE — H&P (Signed)
History and Physical    Patient: Thomas Doyle NTI:[id number] DOB: 1928/08/14 DOA: 01/07/2022 DOS: the patient was seen and examined on 12/24/2021 PCP: Leeroy Cha, MD  Patient coming from: Home  Chief Complaint:  Chief Complaint  Patient presents with   Shortness of Breath   HPI: Thomas Doyle is a 88 y.o. male with medical history significant of large B-cell lymphoma stage 4 not on treatment following palliative care, non-hodgkin's lymphoma, paroxysmal A.fib (not on anticoagulation), COPD, HTN who presents with worsening cough and shortness of breath.  Symptoms have been ongoing for the past 2 weeks.  Reports severe cough productive of brown thick sputum.  Has increasing shortness of breath both at rest and with exertion.  No fever.  No chest pain.  Has noticed some increased swelling of the legs for the past 3 to 5 months.  Also has noticed decrease sensation to the right lower extremity from the mid pretibial region down to his feet for the past 3 months.  Today felt like he was "dying."  He is on palliative care outpatient and now expressed wishes to be on hospice following discharge.  He was evaluated in the ED yesterday with chest x-ray showing possible pulmonary interstitial edema.  He was discharged on Augmentin.   In the ED he was afebrile, normotensive but tachypneic initially hypoxic down to 66% on room air requiring 10 L via nonrebreather.  Has been weaned down to 6 L via nasal cannula. Chest x-ray shows moderate to severe bilateral infiltrate with small layering bilateral effusion.  COVID PCR is negative.  BNP of 294.  No leukocytosis, hemoglobin 11.2.  BMP unremarkable.  Mildly elevated total bilirubin 1.6.  He was started on IV azithromycin and Rocephin in the ED. As mentioned in the history of present illness. All other systems reviewed and are negative. Review of Systems: As mentioned in the history of present illness. All other systems reviewed and are  negative. Past Medical History:  Diagnosis Date   AAA (abdominal aortic aneurysm) ([place])    intrarenal   Anemia    COPD (chronic obstructive pulmonary disease) ([place]) dx April 2013   Hypertension    Large B-cell lymphoma ([place]) 11/2011   Mitral valve prolapse    Non Hodgkin's lymphoma (HCC)    PAF (paroxysmal atrial fibrillation) (H[facility])    On amiodarone, digoxin. Not on coumadin   Pneumonia April 2013   Right bundle branch block (RBBB)    Sick sinus syndrome [facility])    Past Surgical History:  Procedure Laterality Date   abdominal aorta aneurysm duplex  11/20/2012   normal - demonstrates normal taper   CARDIAC CATHETERIZATION  09/02/2010   normal LV function, normal coronary arteries, irregularity of the distal aorta with a small aneursymal dilatation   CARDIOVASCULAR STRESS TEST  08/20/2010   evidence of mild-moderate ischemia in Basal inferoseptal, Basal inferior, Mid inferoseptal and Mid inferior regions. EKG negative for ischemia. Patient developed RBBB during exercise and transient ventricular bigeminal rhythm   CHEST TUBE INSERTION  1990's   fell  thru deck, punctured lung, chest tube   CHOLECYSTECTOMY  1980's   CIRCUMCISION  age 51   DOPPLER ECHOCARDIOGRAPHY  04/05/2012   EF 63%, borderline MVP with mild mitral regurg   TONSILLECTOMY     Social History:  reports that he has never smoked. He has never used smokeless tobacco. He reports that he does not drink alcohol and does not use drugs.  Allergies  Allergen Reactions   Codeine  Nausea Only    Family History  Problem Relation Age of Onset   Atrial fibrillation Mother    Cancer Father    Heart attack Father     Prior to Admission medications   Medication Sig Start Date End Date Taking? Authorizing Provider  acetaminophen (TYLENOL) 500 MG tablet Take 1,000 mg by mouth daily as needed (pain).   Yes [provider]  albuterol (VENTOLIN HFA) 108 (90 Base) MCG/ACT inhaler Inhale 2 puffs into the lungs every 6 (six)  hours as needed for wheezing or shortness of breath. 07/25/21  Yes Hall, Carole N, DO  ALPRAZolam Duanne Moron) 0.5 MG tablet Take 0.5 mg by mouth at bedtime.   Yes [provider]  amiodarone (PACERONE) 200 MG tablet TAKE 1 AND 1/2 TABLETS BY MOUTH DAILY Patient taking differently: Take 300 mg by mouth daily. Taking 1 & 1/2 tab daily = 300 mg 09/07/20  Yes Troy Sine, MD  amLODipine (NORVASC) 10 MG tablet Take 1 tablet (10 mg total) by mouth daily. 07/12/21  Yes Troy Sine, MD  amoxicillin-clavulanate (AUGMENTIN) 875-125 MG tablet Take 1 tablet by mouth every 12 (twelve) hours. Patient taking differently: Take 1 tablet by mouth daily as needed (for bronchitis symptoms per patient and daughter). 01/09/22  Yes Daleen Bo, MD  aspirin EC 81 MG tablet Take 81 mg by mouth daily. Swallow whole.   Yes [provider]  benzonatate (TESSALON) 200 MG capsule Take 1 capsule (200 mg total) by mouth 3 (three) times daily as needed for cough. 07/25/21  Yes Kayleen Memos, DO  bismuth subsalicylate (PEPTO BISMOL) 262 MG/15ML suspension Take by mouth daily as needed for indigestion (acid reflux).   Yes [provider]  Cholecalciferol (VITAMIN D3) 75 MCG (3000 UT) TABS Take 3,000 Units by mouth daily.   Yes [provider]  Cyanocobalamin 2500 MCG TABS Take 2,500 mcg by mouth daily. Vitamin B12   Yes [provider]  loperamide (IMODIUM) 2 MG capsule Take 2 mg by mouth daily as needed for diarrhea or loose stools.   Yes [provider]  Omega-3 Fatty Acids (FISH OIL PO) Take 1 capsule by mouth daily.   Yes [provider]  polyvinyl alcohol (ARTIFICIAL TEARS) 1.4 % ophthalmic solution Place 1 drop into both eyes daily as needed for dry eyes.   Yes [provider]  Probiotic Product (PROBIOTIC PO) Take 1 tablet by mouth daily.   Yes [provider]  promethazine (PHENERGAN) 25 MG tablet Take 25 mg by mouth 2 (two) times daily as  needed for nausea or vomiting.   Yes [provider]  rOPINIRole (REQUIP) 1 MG tablet Take 1 mg by mouth at bedtime.   Yes [provider]  tamsulosin (FLOMAX) 0.4 MG CAPS capsule Take 0.4 mg by mouth at bedtime as needed (infrequent urination). 11/11/17  Yes [provider]    Physical Exam: Vitals:   01/21/2022 2000 12/24/2021 2015 12/25/2021 2045 12/24/2021 2215  BP: (!) 116/50 (!) 108/47 (!) 113/47 (!) 109/48  Pulse: 83 82 77 66  Resp: (!) 32 18 (!) 32 (!) 31  Temp:      TempSrc:      SpO2: (!) 84% 96% 96% 92%  Weight:      Height:       Constitutional: NAD, calm, comfortable, chronically ill-appearing elderly male laying at approximately 20 degree incline in bed Eyes: lids and conjunctivae normal ENMT: Mucous membranes are moist.   Neck: normal, supple,  Respiratory: Diminished lung sounds throughout but clear to auscultation bilaterally, no wheezing, no crackles. Normal respiratory effort. No accessory muscle use.  Frequent dry nonproductive cough on 6 L via nasal cannula Cardiovascular: Regular rate and rhythm, no murmurs / rubs / gallops. No extremity edema. 2+ pedal pulses.  Abdomen: no tenderness, Bowel sounds positive.  Musculoskeletal: no clubbing / cyanosis. No joint deformity upper and lower extremities. Good ROM, no contractures. Normal muscle tone.  Skin: no rashes, lesions, ulcers.  Neurologic: CN 2-12 grossly intact. Strength 5/5 in all 4.  Psychiatric: Normal judgment and insight. Alert and oriented x 3. Normal mood. Data Reviewed:  See HPI  Assessment and Plan: * Acute respiratory failure with hypoxia ([place]) Questionable pneumonia/COPD exacerbation vs new onset CHF -CXR with bibasilar infiltrate versus pulmonary edema.  He was evaluated in ED yesterday with chest x-ray suggestive of more pulmonary edema.  BNP unequivocal at 294.  He does report some increased lower extremity edema although this is not appreciated on exam. -Will trial 20 mg IV  Lasix overnight and follow intake and output -Obtain echocardiogram.  He has history of mitral valve prolapse seen on echo in 2013. -Continue IV Rocephin and azithromycin for now pending procalcitonin -one time dose of IV solu medrol  Paresthesia Reports paresthesia to right lower extremity from mid tibial region down to foot has good +2 dorsalis pedis pulse -Check venous Doppler ultrasound  Paroxysmal atrial fibrillation (HCC) Not on anticoagulation.  This was discontinued on his own accord. -Continue amiodarone  Large B-cell lymphoma [facility]) Not currently following oncology.  He is followed by palliative care outpatient and wishes to consider hospice at this time. -Consult hospice in the morning  Essential hypertension - Continue home amlodipine      Advance Care Planning:   Code Status: DNR   Consults: none  Family Communication: No family at bedside  Severity of Illness: The appropriate patient status for this patient is INPATIENT. Inpatient status is judged to be reasonable and necessary in order to provide the required intensity of service to ensure the patient's safety. The patient's presenting symptoms, physical exam findings, and initial radiographic and laboratory data in the context of their chronic comorbidities is felt to place them at high risk for further clinical deterioration. Furthermore, it is not anticipated that the patient will be medically stable for discharge from the hospital within 2 midnights of admission.   * I certify that at the point of admission it is my clinical judgment that the patient will require inpatient hospital care spanning beyond 2 midnights from the point of admission due to high intensity of service, high risk for further deterioration and high frequency of surveillance required.*  Author: Orene Desanctis, DO 12/23/2021 11:43 PM  For on call review [email or web address].

## 2022-01-10 NOTE — Assessment & Plan Note (Signed)
Not currently following oncology.  He is followed by palliative care outpatient and wishes to consider hospice at this time. -Consult hospice in the morning

## 2022-01-10 NOTE — ED Triage Notes (Signed)
Pt bib EMS d/t shob. Pt 66% on arrival of EMS to home. Pt on non rebreather currently. Pt seen yesterday for the same.

## 2022-01-10 NOTE — Assessment & Plan Note (Signed)
Not on anticoagulation.  This was discontinued on his own accord. -Continue amiodarone

## 2022-01-10 NOTE — ED Provider Notes (Signed)
[facility] DEPT Provider Note   CSN: [id number] Arrival date & time: 01/08/2022  Thomas Doyle     History  Chief Complaint  Patient presents with   Shortness of Breath    Thomas Doyle is a 88 y.o. male.   Shortness of Breath Patient presents with shortness of breath.I have been seen in the ER yesterday and diagnosed with potential bronchitis.  Also given some fluids.  Has had a cough with some chest pain.  X-ray from yesterday showed potentially volume overload although patient states he has not been eating and drink as much.  Has a B-cell lymphoma that is just being followed.  States he has had some chills.  No swelling in his legs.  Found to have room air sats of 66% at home.    Past Medical History:  Diagnosis Date   AAA (abdominal aortic aneurysm) ([place])    intrarenal   Anemia    COPD (chronic obstructive pulmonary disease) ([place]) dx April 2013   Hypertension    Large B-cell lymphoma ([place]) 11/2011   Mitral valve prolapse    Non Hodgkin's lymphoma (H[facility])    PAF (paroxysmal atrial fibrillation) ([facility])    On amiodarone, digoxin. Not on coumadin   Pneumonia April 2013   Right bundle branch block (RBBB)    Sick sinus syndrome ([facility])     Home Medications Prior to Admission medications   Medication Sig Start Date End Date Taking? Authorizing Provider  acetaminophen (TYLENOL) 500 MG tablet Take 1,000 mg by mouth daily as needed (pain).   Yes [provider]  albuterol (VENTOLIN HFA) 108 (90 Base) MCG/ACT inhaler Inhale 2 puffs into the lungs every 6 (six) hours as needed for wheezing or shortness of breath. 07/25/21  Yes Hall, Carole N, DO  ALPRAZolam Duanne Moron) 0.5 MG tablet Take 0.5 mg by mouth at bedtime.   Yes [provider]  amiodarone (PACERONE) 200 MG tablet TAKE 1 AND 1/2 TABLETS BY MOUTH DAILY Patient taking differently: Take 300 mg by mouth daily. Taking 1 & 1/2 tab daily = 300 mg 09/07/20  Yes Troy Sine, MD  amLODipine  (NORVASC) 10 MG tablet Take 1 tablet (10 mg total) by mouth daily. 07/12/21  Yes Troy Sine, MD  amoxicillin-clavulanate (AUGMENTIN) 875-125 MG tablet Take 1 tablet by mouth every 12 (twelve) hours. Patient taking differently: Take 1 tablet by mouth daily as needed (for bronchitis symptoms per patient and daughter). 01/09/22  Yes Daleen Bo, MD  aspirin EC 81 MG tablet Take 81 mg by mouth daily. Swallow whole.   Yes [provider]  benzonatate (TESSALON) 200 MG capsule Take 1 capsule (200 mg total) by mouth 3 (three) times daily as needed for cough. 07/25/21  Yes Kayleen Memos, DO  bismuth subsalicylate (PEPTO BISMOL) 262 MG/15ML suspension Take by mouth daily as needed for indigestion (acid reflux).   Yes [provider]  Cholecalciferol (VITAMIN D3) 75 MCG (3000 UT) TABS Take 3,000 Units by mouth daily.   Yes [provider]  Cyanocobalamin 2500 MCG TABS Take 2,500 mcg by mouth daily. Vitamin B12   Yes [provider]  loperamide (IMODIUM) 2 MG capsule Take 2 mg by mouth daily as needed for diarrhea or loose stools.   Yes [provider]  Omega-3 Fatty Acids (FISH OIL PO) Take 1 capsule by mouth daily.   Yes [provider]  polyvinyl alcohol (ARTIFICIAL TEARS) 1.4 % ophthalmic solution Place 1 drop into both eyes daily  as needed for dry eyes.   Yes [provider]  Probiotic Product (PROBIOTIC PO) Take 1 tablet by mouth daily.   Yes [provider]  promethazine (PHENERGAN) 25 MG tablet Take 25 mg by mouth 2 (two) times daily as needed for nausea or vomiting.   Yes [provider]  rOPINIRole (REQUIP) 1 MG tablet Take 1 mg by mouth at bedtime.   Yes [provider]  tamsulosin (FLOMAX) 0.4 MG CAPS capsule Take 0.4 mg by mouth at bedtime as needed (infrequent urination). 11/11/17  Yes [provider]      Allergies    Codeine    Review of Systems   Review of Systems  Respiratory:   Positive for shortness of breath.     Physical Exam Updated Vital Signs BP (!) 113/47   Pulse 77   Temp 99.7 F (37.6 C) (Oral)   Resp (!) 32   Ht '5\' 10"'$  (1.778 m)   Wt 65.8 kg   SpO2 96%   BMI 20.81 kg/m  Physical Exam Vitals and nursing note reviewed.  HENT:     Head: Normocephalic.  Cardiovascular:     Rate and Rhythm: Regular rhythm.  Pulmonary:     Breath sounds: No rhonchi or rales.     Comments: Occasional cough that appears to be nonproductive.Harsh breath sounds without focal wheezes. Chest:     Chest wall: No tenderness.  Abdominal:     Tenderness: There is no abdominal tenderness.  Musculoskeletal:     Right lower leg: No edema.     Left lower leg: No edema.  Skin:    General: Skin is warm.     Capillary Refill: Capillary refill takes less than 2 seconds.  Neurological:     Mental Status: He is alert and oriented to person, place, and time.     ED Results / Procedures / Treatments   Labs (all labs ordered are listed, but only abnormal results are displayed) Labs Reviewed  COMPREHENSIVE METABOLIC PANEL - Abnormal; Notable for the following components:      Result Value   Sodium 134 (*)    CO2 21 (*)    Glucose, Bld 118 (*)    Calcium 8.0 (*)    Total Protein 5.9 (*)    Albumin 3.2 (*)    AST 48 (*)    Total Bilirubin 1.6 (*)    All other components within normal limits  CBC WITH DIFFERENTIAL/PLATELET - Abnormal; Notable for the following components:   RBC 3.40 (*)    Hemoglobin 11.2 (*)    HCT 34.0 (*)    Lymphs Abs 0.5 (*)    All other components within normal limits  BRAIN NATRIURETIC PEPTIDE - Abnormal; Notable for the following components:   B Natriuretic Peptide 294.4 (*)    All other components within normal limits  RESP PANEL BY RT-PCR (FLU A&B, COVID) ARPGX2  CULTURE, BLOOD (ROUTINE X 2)  CULTURE, BLOOD (ROUTINE X 2)  LACTIC ACID, PLASMA  LACTIC ACID, PLASMA    EKG EKG Interpretation  Date/Time:  Monday January 10 2022 19:23:07  EDT Ventricular Rate:  79 PR Interval:  174 QRS Duration: 161 QT Interval:  435 QTC Calculation: 499 R Axis:   -75 Text Interpretation: Sinus rhythm RBBB and LAFB Confirmed by Davonna Belling [phone]) on 01/05/2022 7:35:03 PM  Radiology DG Chest Portable 1 View  Result Date: 12/27/2021 CLINICAL DATA:  Productive cough. EXAM: PORTABLE CHEST 1 VIEW COMPARISON:  January 09, 2022 FINDINGS:  Moderate to marked severity infiltrates are seen within the bilateral lung bases and upper left lung. This is increased in severity when compared to the prior study. Small, partially layering bilateral pleural effusions are noted. No pneumothorax is identified. The cardiac silhouette is mildly enlarged and unchanged in size. Moderate severity calcification of the aortic arch is noted. The visualized skeletal structures are unremarkable. IMPRESSION: 1. Moderate to marked severity bilateral infiltrates, increased in severity when compared to the prior study. 2. Small, partially layering bilateral pleural effusions. Electronically Signed   By: Virgina Norfolk M.D.   On: 01/02/2022 19:37   DG Chest 2 View  Result Date: 01/09/2022 CLINICAL DATA:  Chest pain. Nausea and vomiting. Large B-cell lymphoma. EXAM: CHEST - 2 VIEW COMPARISON:  07/13/2021 FINDINGS: Heart size is within normal limits. Scarring is again seen in the right lung base. Increased diffuse interstitial prominence is suspicious for mild interstitial edema. No evidence of parenchymal consolidation or pleural effusion. IMPRESSION: Increased diffuse interstitial prominence, suspicious for pulmonary interstitial edema. Electronically Signed   By: Marlaine Hind M.D.   On: 01/09/2022 11:54   DG Thoracic Spine 2 View  Result Date: 01/09/2022 CLINICAL DATA:  Thoracic back pain. EXAM: THORACIC SPINE 2 VIEWS COMPARISON:  None Available. FINDINGS: There is no evidence of thoracic spine fracture. Alignment is normal. No focal lytic or sclerotic bone lesions identified.  Mild thoracic dextroscoliosis noted. Severe degenerative disc disease noted in the lower cervical and upper lumbar spine. IMPRESSION: No acute findings. Severe lower cervical and upper lumbar degenerative disc disease. Electronically Signed   By: Marlaine Hind M.D.   On: 01/09/2022 11:52   DG Ribs Bilateral  Result Date: 01/09/2022 CLINICAL DATA:  Bilateral rib and chest wall pain. EXAM: BILATERAL RIBS - 3+ VIEW COMPARISON:  None Available. FINDINGS: No acute fracture seen involving the ribs. Old fracture deformities are seen involving the right anterolateral 8th and 9th ribs. Other bone lesions identified. IMPRESSION: No acute findings. Old fracture deformities of right anterolateral 8th and 9th ribs. Electronically Signed   By: Marlaine Hind M.D.   On: 01/09/2022 11:51    Procedures Procedures    Medications Ordered in ED Medications  cefTRIAXone (ROCEPHIN) 2 g in sodium chloride 0.9 % 100 mL IVPB (2 g Intravenous New Bag/Given 01/20/2022 2057)  azithromycin (ZITHROMAX) 500 mg in sodium chloride 0.9 % 250 mL IVPB (has no administration in time range)  ipratropium-albuterol (DUONEB) 0.5-2.5 (3) MG/3ML nebulizer solution 3 mL (3 mLs Nebulization Given 01/14/2022 2005)    ED Course/ Medical Decision Making/ A&P                           Medical Decision Making Problems Addressed: Hypoxia: acute illness or injury that poses a threat to life or bodily functions  Amount and/or Complexity of Data Reviewed Labs: ordered. Radiology: ordered.  Risk Prescription drug management.  Critical Care Total time providing critical care: 30 minutes   Patient presented with the severe shortness of breath.  Differential gnosis long includes many life-threatening conditions.  Will need extensive work-up.  Differential diagnosis does include pneumonia CHF COPD.  X-ray yesterday showed possible CHF.  Today more infiltrates.  Independently interpreted by me.  Yesterday had been thought more likely bronchitis does  not have peripheral edema has been coughing with some sputum production.  Still did not have peripheral edema no JVD but worsening x-ray and does have elevated BNP.  Antibiotics were given with the productive  cough.  Initial sats were in the 60s and required nonrebreather but now down to about 6 L nasal cannula and sats running right around 90%.  Patient states that he will probably transition to hospice when he goes home.  Currently has palliative care that sees him at home        Final Clinical Impression(s) / ED Diagnoses Final diagnoses:  Hypoxia  Large B-cell lymphoma [facility])    Rx / DC Orders ED Discharge Orders     None         Davonna Belling, MD 01/04/2022 2113

## 2022-01-10 NOTE — Assessment & Plan Note (Signed)
Reports paresthesia to right lower extremity from mid tibial region down to foot has good +2 dorsalis pedis pulse -Check venous Doppler ultrasound

## 2022-01-10 NOTE — Assessment & Plan Note (Signed)
-  Continue home amlodipine 

## 2022-01-11 ENCOUNTER — Inpatient Hospital Stay (HOSPITAL_COMMUNITY): Payer: Medicare Other

## 2022-01-11 DIAGNOSIS — R609 Edema, unspecified: Secondary | ICD-10-CM | POA: Diagnosis not present

## 2022-01-11 DIAGNOSIS — I10 Essential (primary) hypertension: Secondary | ICD-10-CM | POA: Diagnosis not present

## 2022-01-11 DIAGNOSIS — J9601 Acute respiratory failure with hypoxia: Secondary | ICD-10-CM | POA: Diagnosis not present

## 2022-01-11 DIAGNOSIS — C8510 Unspecified B-cell lymphoma, unspecified site: Secondary | ICD-10-CM | POA: Diagnosis not present

## 2022-01-11 DIAGNOSIS — D649 Anemia, unspecified: Secondary | ICD-10-CM

## 2022-01-11 DIAGNOSIS — R0609 Other forms of dyspnea: Secondary | ICD-10-CM | POA: Diagnosis not present

## 2022-01-11 LAB — COMPREHENSIVE METABOLIC PANEL
ALT: 39 U/L (ref 0–44)
AST: 45 U/L — ABNORMAL HIGH (ref 15–41)
Albumin: 2.7 g/dL — ABNORMAL LOW (ref 3.5–5.0)
Alkaline Phosphatase: 90 U/L (ref 38–126)
Anion gap: 8 (ref 5–15)
BUN: 19 mg/dL (ref 8–23)
CO2: 23 mmol/L (ref 22–32)
Calcium: 7.8 mg/dL — ABNORMAL LOW (ref 8.9–10.3)
Chloride: 105 mmol/L (ref 98–111)
Creatinine, Ser: 1.17 mg/dL (ref 0.61–1.24)
GFR, Estimated: 59 mL/min — ABNORMAL LOW (ref >60)
Glucose, Bld: 131 mg/dL — ABNORMAL HIGH (ref 70–99)
Potassium: 3.8 mmol/L (ref 3.5–5.1)
Sodium: 136 mmol/L (ref 135–145)
Total Bilirubin: 1.1 mg/dL (ref 0.3–1.2)
Total Protein: 5.3 g/dL — ABNORMAL LOW (ref 6.5–8.1)

## 2022-01-11 LAB — CBC
HCT: 29.5 % — ABNORMAL LOW (ref 39.0–52.0)
Hemoglobin: 9.8 g/dL — ABNORMAL LOW (ref 13.0–17.0)
MCH: 33.1 pg (ref 26.0–34.0)
MCHC: 33.2 g/dL (ref 30.0–36.0)
MCV: 99.7 fL (ref 80.0–100.0)
Platelets: 192 10*3/uL (ref 150–400)
RBC: 2.96 MIL/uL — ABNORMAL LOW (ref 4.22–5.81)
RDW: 13.4 % (ref 11.5–15.5)
WBC: 6.4 10*3/uL (ref 4.0–10.5)
nRBC: 0 % (ref 0.0–0.2)

## 2022-01-11 LAB — ECHOCARDIOGRAM COMPLETE
AR max vel: 2.01 cm2
AV Peak grad: 13.8 mmHg
Ao pk vel: 1.86 m/s
Area-P 1/2: 3.77 cm2
Height: 70 in
S' Lateral: 4.1 cm
Weight: 2320 oz

## 2022-01-11 MED ORDER — FUROSEMIDE 10 MG/ML IJ SOLN
20.0000 mg | Freq: Once | INTRAMUSCULAR | Status: AC
  Administered 2022-01-11: 20 mg via INTRAVENOUS
  Filled 2022-01-11: qty 2

## 2022-01-11 NOTE — Progress Notes (Signed)
Right lower extremity venous duplex has been completed. Preliminary results can be found in CV Proc through chart review.   01/11/22 9:04 AM Thomas Doyle RVT

## 2022-01-11 NOTE — Progress Notes (Signed)
PROGRESS NOTE    Thomas Doyle  UXN:[id number] DOB: [birth year] DOA: 01/13/2022 PCP: Leeroy Cha, MD   Brief Narrative:  HPI per Dr. Ileene Musa on 12/28/2021  Thomas Doyle is a 88 y.o. male with medical history significant of large B-cell lymphoma stage 4 not on treatment following palliative care, non-hodgkin's lymphoma, paroxysmal A.fib (not on anticoagulation), COPD, HTN who presents with worsening cough and shortness of breath.   Symptoms have been ongoing for the past 2 weeks.  Reports severe cough productive of brown thick sputum.  Has increasing shortness of breath both at rest and with exertion.  No fever.  No chest pain.  Has noticed some increased swelling of the legs for the past 3 to 5 months.  Also has noticed decrease sensation to the right lower extremity from the mid pretibial region down to his feet for the past 3 months.  Today felt like he was "dying."  He is on palliative care outpatient and now expressed wishes to be on hospice following discharge.   He was evaluated in the ED yesterday with chest x-ray showing possible pulmonary interstitial edema.  He was discharged on Augmentin.     In the ED he was afebrile, normotensive but tachypneic initially hypoxic down to 66% on room air requiring 10 L via nonrebreather.  Has been weaned down to 6 L via nasal cannula. Chest x-ray shows moderate to severe bilateral infiltrate with small layering bilateral effusion.  COVID PCR is negative.  BNP of 294.   No leukocytosis, hemoglobin 11.2.  BMP unremarkable.  Mildly elevated total bilirubin 1.6.   He was started on IV azithromycin and Rocephin in the ED. As mentioned in the history of present illness. All other systems reviewed and are negative.  **Interim History   BNP was elevated so he started on some diuresis which was very gentle with IV Lasix 20 mg daily.  He received a dose yesterday and today.  Was also given a dose of Solu-Medrol and initiated on nebs.  Started  on antibiotics with ceftriaxone and azithromycin.  Continues to have a significant cough.  Right lower extremity venous duplex showed no evidence of DVT.  Echocardiogram done and showed a normal EF but did show grade 2 diastolic dysfunction.  Palliative Care has been consulted for further goals of care discussion.    Assessment and Plan: * Acute respiratory failure with hypoxia (HCC) -Questionable pneumonia/COPD exacerbation with likely concomitant new onset diastolic CHF -CXR with bibasilar infiltrate versus pulmonary edema.  He was evaluated in ED yesterday with chest x-ray suggestive of more pulmonary edema.  BNP unequivocal at 294.  He does report some increased lower extremity edema although this is not appreciated on exam. -Given a trial of Lasix 20 mg IV once and then repeated again this morning -Obtain echocardiogram.  He has history of mitral valve prolapse seen on echo in 2013.  Repeat echo showed grade 2 diastolic dysfunction with a normal EF of 55 to 60% -Continue IV Rocephin and azithromycin for now pending procalcitonin -one time dose of IV solu medrol given and may repeat in the morning -SpO2: (!) 88 % O2 Flow Rate (L/min): 6 L/min -Continuous pulse oximetry maintain O2 saturation greater 90% -Continue supplemental oxygen via nasal cannula wean O2 as tolerated -Repeat chest x-ray in the a.m. -Continue monitor for signs and symptoms of volume overload   Paresthesia -Reports paresthesia to right lower extremity from mid tibial region down to foot has good +2 dorsalis pedis pulse -  Check venous Doppler ultrasound and this was negative for DVT   Paroxysmal atrial fibrillation (HCC) -Not on anticoagulation.  This was discontinued on his own accord. -Continue amiodarone -Continue to monitor on telemetry in the progressive care unit   Large B-cell lymphoma [facility]) -Not currently following oncology.  He is followed by palliative care outpatient and wishes to consider hospice at  this time. -We consulted palliative care for further goals of care discussion; per the patient he is okay with passing away but states that his kids "do not want me to die in peace" -Continue further goals of care discussion and continue symptomatic treatment  Essential hypertension - Continue home amlodipine  Normocytic anemia - Patient's hemoglobin/hematocrit is now trending down and went from 11.6/34.7 -> 11.2/34.0 -> 9.8/29.5 -Check anemia panel in the a.m. -Continue to monitor for signs and symptoms of bleeding; no overt bleeding noted -Repeat CBC in the a.m.  DVT prophylaxis: enoxaparin (LOVENOX) injection 40 mg Start: 01/11/22 1000    Code Status: DNR Family Communication: No family currently at bedside  Disposition Plan:  Level of care: Progressive Status is: Inpatient Remains inpatient appropriate because: Continues to be dyspneic and has a pretty significant cough.   Consultants:  Palliative care medicine  Procedures:  Echocardiogram IMPRESSIONS     1. Left ventricular ejection fraction, by estimation, is 55 to 60%. The  left ventricle has normal function. The left ventricle has no regional  wall motion abnormalities. Left ventricular diastolic parameters are  consistent with Grade II diastolic  dysfunction (pseudonormalization).   2. Right ventricular systolic function is normal. The right ventricular  size is normal. There is moderately elevated pulmonary artery systolic  pressure. The estimated right ventricular systolic pressure is 76.1 mmHg.   3. Left atrial size was mildly dilated.   4. The mitral valve is normal in structure. No evidence of mitral valve  regurgitation. No evidence of mitral stenosis. Moderate mitral annular  calcification.   5. Tricuspid valve regurgitation is mild to moderate.   6. The aortic valve is tricuspid. There is mild calcification of the  aortic valve. There is mild thickening of the aortic valve. Aortic valve  regurgitation is  mild. Aortic valve sclerosis is present, with no evidence  of aortic valve stenosis.   Comparison(s): Unable to access 2013 images.   FINDINGS   Left Ventricle: Left ventricular ejection fraction, by estimation, is 55  to 60%. The left ventricle has normal function. The left ventricle has no  regional wall motion abnormalities. The left ventricular internal cavity  size was normal in size. There is   no left ventricular hypertrophy. Left ventricular diastolic parameters  are consistent with Grade II diastolic dysfunction (pseudonormalization).   Right Ventricle: The right ventricular size is normal. No increase in  right ventricular wall thickness. Right ventricular systolic function is  normal. There is moderately elevated pulmonary artery systolic pressure.  The tricuspid regurgitant velocity is  3.20 m/s, and with an assumed right atrial pressure of 8 mmHg, the  estimated right ventricular systolic pressure is 95.0 mmHg.   Left Atrium: Left atrial size was mildly dilated.   Right Atrium: Right atrial size was normal in size.   Pericardium: There is no evidence of pericardial effusion.   Mitral Valve: The mitral valve is normal in structure. Moderate mitral  annular calcification. No evidence of mitral valve regurgitation. No  evidence of mitral valve stenosis.   Tricuspid Valve: The tricuspid valve is normal in structure. Tricuspid  valve regurgitation  is mild to moderate. No evidence of tricuspid  stenosis.   Aortic Valve: The aortic valve is tricuspid. There is mild calcification  of the aortic valve. There is mild thickening of the aortic valve. There  is mild aortic valve annular calcification. Aortic valve regurgitation is  mild. Aortic valve sclerosis is  present, with no evidence of aortic valve stenosis. Aortic valve peak  gradient measures 13.8 mmHg.   Pulmonic Valve: The pulmonic valve was grossly normal. Pulmonic valve  regurgitation is mild. No evidence of  pulmonic stenosis.   Aorta: The aortic root and ascending aorta are structurally normal, with  no evidence of dilitation.   IAS/Shunts: No atrial level shunt detected by color flow Doppler.      LEFT VENTRICLE  PLAX 2D  LVIDd:         5.70 cm   Diastology  LVIDs:         4.10 cm   LV e' medial:    7.62 cm/s  LV PW:         0.80 cm   LV E/e' medial:  8.9  LV IVS:        0.90 cm   LV e' lateral:   12.90 cm/s  LVOT diam:     1.90 cm   LV E/e' lateral: 5.3  LV SV:         92  LV SV Index:   51  LVOT Area:     2.84 cm      RIGHT VENTRICLE             IVC  RV S prime:     13.70 cm/s  IVC diam: 2.00 cm  TAPSE (M-mode): 2.1 cm   LEFT ATRIUM           Index        RIGHT ATRIUM           Index  LA diam:      4.60 cm 2.53 cm/m   RA Area:     15.60 cm  LA Vol (A2C): 53.2 ml 29.22 ml/m  RA Volume:   40.30 ml  22.13 ml/m  LA Vol (A4C): 72.7 ml 39.93 ml/m   AORTIC VALVE                 PULMONIC VALVE  AV Area (Vmax): 2.01 cm     PV Vmax:       1.07 m/s  AV Vmax:        186.00 cm/s  PV Peak grad:  4.6 mmHg  AV Peak Grad:   13.8 mmHg  LVOT Vmax:      132.00 cm/s  LVOT Vmean:     87.600 cm/s  LVOT VTI:       0.326 m     AORTA  Ao Root diam: 3.20 cm  Ao Asc diam:  3.70 cm   MITRAL VALVE               TRICUSPID VALVE  MV Area (PHT): 3.77 cm    TR Peak grad:   41.0 mmHg  MV Decel Time: 201 msec    TR Vmax:        320.00 cm/s  MV E velocity: 67.90 cm/s  MV A velocity: 53.70 cm/s  SHUNTS  MV E/A ratio:  1.26        Systemic VTI:  0.33 m  Systemic Diam: 1.90 cm   Antimicrobials:  Anti-infectives (From admission, onward)    Start     Dose/Rate Route Frequency Ordered Stop   01/11/22 2200  azithromycin (ZITHROMAX) 500 mg in sodium chloride 0.9 % 250 mL IVPB        500 mg 250 mL/hr over 60 Minutes Intravenous Every 24 hours 12/25/2021 2346     01/11/22 2100  cefTRIAXone (ROCEPHIN) 1 g in sodium chloride 0.9 % 100 mL IVPB        1 g 200 mL/hr over 30  Minutes Intravenous Every 24 hours 12/31/2021 2346     01/08/2022 2045  cefTRIAXone (ROCEPHIN) 2 g in sodium chloride 0.9 % 100 mL IVPB        2 g 200 mL/hr over 30 Minutes Intravenous  Once 12/26/2021 2039 12/27/2021 2127   01/11/2022 2045  azithromycin (ZITHROMAX) 500 mg in sodium chloride 0.9 % 250 mL IVPB        500 mg 250 mL/hr over 60 Minutes Intravenous  Once 01/12/2022 2039 12/27/2021 2226        Subjective: Seen and examined at bedside states that he still feels short of breath and had a pretty significant cough.  States the cough was so worsened states that he cannot catch his breath after he starts coughing.  Denies any lightheadedness.  Continues to have some lower extremity swelling.  No other concerns or complaints at this time.  Objective: Vitals:   01/11/22 1730 01/11/22 1800 01/11/22 1822 01/11/22 1845  BP:  (!) 130/54    Pulse:  68  63  Resp:  (!) 28  (!) 23  Temp: 97.7 F (36.5 C)  97.9 F (36.6 C)   TempSrc: Oral  Oral   SpO2:  95% 96% (!) 88%  Weight:      Height:       No intake or output data in the 24 hours ending 01/11/22 1939 Filed Weights   12/28/2021 1905  Weight: 65.8 kg   Examination: Physical Exam:  Constitutional: Thin chronically ill-appearing Caucasian male currently no acute distress appears little uncomfortable slightly and is wearing supplemental oxygen nasal cannula Respiratory: Diminished to auscultation bilaterally with coarse breath sounds and some crackles, no wheezing, rales, rhonchi. Normal respiratory effort and patient is not tachypenic. No accessory muscle use.  Unlabored breathing Cardiovascular: RRR, no murmurs / rubs / gallops. S1 and S2 auscultated.  Has some mild lower extremity edema worse on the right compared to left Abdomen: Soft, non-tender, non-distended.  Bowel sounds positive.  GU: Deferred. Musculoskeletal: No clubbing / cyanosis of digits/nails. No joint deformity upper and lower extremities.  Skin: No rashes, lesions, ulcers on  limited skin evaluation. No induration; Warm and dry.  Neurologic: CN 2-12 grossly intact with no focal deficits. Romberg sign and cerebellar reflexes not assessed.  Psychiatric: Normal judgment and insight. Alert and oriented x 3. Normal mood and appropriate affect.   Data Reviewed: I have personally reviewed following labs and imaging studies  CBC: Recent Labs  Lab 01/09/22 1146 01/11/2022 1916 01/11/22 0421  WBC 6.2 6.2 6.4  NEUTROABS 4.8 5.1  --   HGB 11.6* 11.2* 9.8*  HCT 34.7* 34.0* 29.5*  MCV 99.4 100.0 99.7  PLT 202 207 573   Basic Metabolic Panel: Recent Labs  Lab 01/09/22 1146 01/03/2022 1916 01/11/22 0421  NA 135 134* 136  K 4.3 3.9 3.8  CL 103 105 105  CO2 24 21* 23  GLUCOSE 110* 118* 131*  BUN 15 22  19  CREATININE 1.15 1.11 1.17  CALCIUM 8.5* 8.0* 7.8*   GFR: Estimated Creatinine Clearance: 39.1 mL/min (by C-G formula based on SCr of 1.17 mg/dL). Liver Function Tests: Recent Labs  Lab 01/09/22 1146 01/15/2022 1916 01/11/22 0421  AST 54* 48* 45*  ALT 47* 41 39  ALKPHOS 108 100 90  BILITOT 1.4* 1.6* 1.1  PROT 6.4* 5.9* 5.3*  ALBUMIN 3.6 3.2* 2.7*   No results for input(s): "LIPASE", "AMYLASE" in the last 168 hours. No results for input(s): "AMMONIA" in the last 168 hours. Coagulation Profile: No results for input(s): "INR", "PROTIME" in the last 168 hours. Cardiac Enzymes: No results for input(s): "CKTOTAL", "CKMB", "CKMBINDEX", "TROPONINI" in the last 168 hours. BNP (last 3 results) No results for input(s): "PROBNP" in the last 8760 hours. HbA1C: No results for input(s): "HGBA1C" in the last 72 hours. CBG: No results for input(s): "GLUCAP" in the last 168 hours. Lipid Profile: No results for input(s): "CHOL", "HDL", "LDLCALC", "TRIG", "CHOLHDL", "LDLDIRECT" in the last 72 hours. Thyroid Function Tests: No results for input(s): "TSH", "T4TOTAL", "FREET4", "T3FREE", "THYROIDAB" in the last 72 hours. Anemia Panel: No results for input(s):  "VITAMINB12", "FOLATE", "FERRITIN", "TIBC", "IRON", "RETICCTPCT" in the last 72 hours. Sepsis Labs: Recent Labs  Lab 01/09/22 1147 01/01/2022 1917 01/04/2022 2100  LATICACIDVEN 1.5 1.1 1.2    Recent Results (from the past 240 hour(s))  Culture, blood (routine x 2)     Status: None (Preliminary result)   Collection Time: 01/13/2022  7:13 PM   Specimen: BLOOD  Result Value Ref Range Status   Specimen Description   Final    BLOOD RIGHT ANTECUBITAL Performed at [facility] [address] [place], [place] [address]    Special Requests   Final    BOTTLES DRAWN AEROBIC AND ANAEROBIC Blood Culture adequate volume Performed at [facility] [address], [place], [place] [address]    Culture   Final    NO GROWTH < 12 HOURS Performed at [facility] [address], [place], [place] [address]    Report Status PENDING  Incomplete  Culture, blood (routine x 2)     Status: None (Preliminary result)   Collection Time: 12/24/2021  7:13 PM   Specimen: BLOOD  Result Value Ref Range Status   Specimen Description   Final    BLOOD BLOOD LEFT FOREARM Performed at [facility] [address] [place], [place] [address]    Special Requests   Final    BOTTLES DRAWN AEROBIC AND ANAEROBIC Blood Culture adequate volume Performed at [facility] [address] [place], [place] [address]    Culture   Final    NO GROWTH < 12 HOURS Performed at [facility] [address] [place], [place] [address]    Report Status PENDING  Incomplete  Resp Panel by RT-PCR (Flu A&B, Covid) Anterior Nasal Swab     Status: None   Collection Time: 01/04/2022  7:18 PM   Specimen: Anterior Nasal Swab  Result Value Ref Range Status   SARS Coronavirus 2 by RT PCR NEGATIVE NEGATIVE Final    Comment: (NOTE) SARS-CoV-2 target nucleic acids are NOT DETECTED.  The SARS-CoV-2 RNA is generally detectable in upper respiratory specimens  during the acute phase of infection. The lowest concentration of SARS-CoV-2 viral copies this assay can detect is 138 copies/mL. A negative result does not preclude SARS-Cov-2 infection and should not be used as the sole  basis for treatment or other patient management decisions. A negative result may occur with  improper specimen collection/handling, submission of specimen other than nasopharyngeal swab, presence of viral mutation(s) within the areas targeted by this assay, and inadequate number of viral copies(<138 copies/mL). A negative result must be combined with clinical observations, patient history, and epidemiological information. The expected result is Negative.  Fact Sheet for Patients:  EntrepreneurPulse.com.au  Fact Sheet for Healthcare Providers:  IncredibleEmployment.be  This test is no t yet approved or cleared by the [place] FDA and  has been authorized for detection and/or diagnosis of SARS-CoV-2 by FDA under an Emergency Use Authorization (EUA). This EUA will remain  in effect (meaning this test can be used) for the duration of the COVID-19 declaration under Section 564(b)(1) of the Act, 21 U.S.C.section 360bbb-3(b)(1), unless the authorization is terminated  or revoked sooner.       Influenza A by PCR NEGATIVE NEGATIVE Final   Influenza B by PCR NEGATIVE NEGATIVE Final    Comment: (NOTE) The Xpert Xpress SARS-CoV-2/FLU/RSV plus assay is intended as an aid in the diagnosis of influenza from Nasopharyngeal swab specimens and should not be used as a sole basis for treatment. Nasal washings and aspirates are unacceptable for Xpert Xpress SARS-CoV-2/FLU/RSV testing.  Fact Sheet for Patients: EntrepreneurPulse.com.au  Fact Sheet for Healthcare Providers: IncredibleEmployment.be  This test is not yet approved or cleared by the [place] FDA and has been authorized for detection  and/or diagnosis of SARS-CoV-2 by FDA under an Emergency Use Authorization (EUA). This EUA will remain in effect (meaning this test can be used) for the duration of the COVID-19 declaration under Section 564(b)(1) of the Act, 21 U.S.C. section 360bbb-3(b)(1), unless the authorization is terminated or revoked.  Performed at [facility], [place] [address], [place], [place] [address]     Radiology Studies: ECHOCARDIOGRAM COMPLETE  Result Date: 01/11/2022    ECHOCARDIOGRAM REPORT   Patient Name:   Thomas Doyle Date of Exam: 01/11/2022 Medical Rec #:  [id number]      Height:       70.0 in Accession #:    [id number]     Weight:       145.0 lb Date of Birth:  [birth year]     BSA:          1.821 m Patient Age:    13 years       BP:           99/48 mmHg Patient Gender: M              HR:           69 bpm. Exam Location:  Inpatient Procedure: 2D Echo, Cardiac Doppler and Color Doppler Indications:    Dyspnea  History:        Patient has prior history of Echocardiogram examinations, most                 recent 04/05/2012. COPD, Arrythmias:Atrial Fibrillation; Risk                 Factors:Hypertension.  Sonographer:    Jefferey Pica Referring Phys: [id number] [place]  1. Left ventricular ejection fraction, by estimation, is 55 to 60%. The left ventricle has normal function. The left ventricle has no regional wall motion abnormalities. Left ventricular diastolic parameters are consistent with Grade II diastolic dysfunction (pseudonormalization).  2. Right ventricular systolic function is normal. The right ventricular size is normal. There is  moderately elevated pulmonary artery systolic pressure. The estimated right ventricular systolic pressure is 42.3 mmHg.  3. Left atrial size was mildly dilated.  4. The mitral valve is normal in structure. No evidence of mitral valve regurgitation. No evidence of mitral stenosis. Moderate mitral annular calcification.  5. Tricuspid valve  regurgitation is mild to moderate.  6. The aortic valve is tricuspid. There is mild calcification of the aortic valve. There is mild thickening of the aortic valve. Aortic valve regurgitation is mild. Aortic valve sclerosis is present, with no evidence of aortic valve stenosis. Comparison(s): Unable to access 2013 images. FINDINGS  Left Ventricle: Left ventricular ejection fraction, by estimation, is 55 to 60%. The left ventricle has normal function. The left ventricle has no regional wall motion abnormalities. The left ventricular internal cavity size was normal in size. There is  no left ventricular hypertrophy. Left ventricular diastolic parameters are consistent with Grade II diastolic dysfunction (pseudonormalization). Right Ventricle: The right ventricular size is normal. No increase in right ventricular wall thickness. Right ventricular systolic function is normal. There is moderately elevated pulmonary artery systolic pressure. The tricuspid regurgitant velocity is 3.20 m/s, and with an assumed right atrial pressure of 8 mmHg, the estimated right ventricular systolic pressure is 53.6 mmHg. Left Atrium: Left atrial size was mildly dilated. Right Atrium: Right atrial size was normal in size. Pericardium: There is no evidence of pericardial effusion. Mitral Valve: The mitral valve is normal in structure. Moderate mitral annular calcification. No evidence of mitral valve regurgitation. No evidence of mitral valve stenosis. Tricuspid Valve: The tricuspid valve is normal in structure. Tricuspid valve regurgitation is mild to moderate. No evidence of tricuspid stenosis. Aortic Valve: The aortic valve is tricuspid. There is mild calcification of the aortic valve. There is mild thickening of the aortic valve. There is mild aortic valve annular calcification. Aortic valve regurgitation is mild. Aortic valve sclerosis is present, with no evidence of aortic valve stenosis. Aortic valve peak gradient measures 13.8 mmHg.  Pulmonic Valve: The pulmonic valve was grossly normal. Pulmonic valve regurgitation is mild. No evidence of pulmonic stenosis. Aorta: The aortic root and ascending aorta are structurally normal, with no evidence of dilitation. IAS/Shunts: No atrial level shunt detected by color flow Doppler.  LEFT VENTRICLE PLAX 2D LVIDd:         5.70 cm   Diastology LVIDs:         4.10 cm   LV e' medial:    7.62 cm/s LV PW:         0.80 cm   LV E/e' medial:  8.9 LV IVS:        0.90 cm   LV e' lateral:   12.90 cm/s LVOT diam:     1.90 cm   LV E/e' lateral: 5.3 LV SV:         92 LV SV Index:   51 LVOT Area:     2.84 cm  RIGHT VENTRICLE             IVC RV S prime:     13.70 cm/s  IVC diam: 2.00 cm TAPSE (M-mode): 2.1 cm LEFT ATRIUM           Index        RIGHT ATRIUM           Index LA diam:      4.60 cm 2.53 cm/m   RA Area:     15.60 cm LA Vol (A2C): 53.2 ml 29.22 ml/m  RA  Volume:   40.30 ml  22.13 ml/m LA Vol (A4C): 72.7 ml 39.93 ml/m  AORTIC VALVE                 PULMONIC VALVE AV Area (Vmax): 2.01 cm     PV Vmax:       1.07 m/s AV Vmax:        186.00 cm/s  PV Peak grad:  4.6 mmHg AV Peak Grad:   13.8 mmHg LVOT Vmax:      132.00 cm/s LVOT Vmean:     87.600 cm/s LVOT VTI:       0.326 m  AORTA Ao Root diam: 3.20 cm Ao Asc diam:  3.70 cm MITRAL VALVE               TRICUSPID VALVE MV Area (PHT): 3.77 cm    TR Peak grad:   41.0 mmHg MV Decel Time: 201 msec    TR Vmax:        320.00 cm/s MV E velocity: 67.90 cm/s MV A velocity: 53.70 cm/s  SHUNTS MV E/A ratio:  1.26        Systemic VTI:  0.33 m                            Systemic Diam: 1.90 cm Rudean Haskell MD Electronically signed by Rudean Haskell MD Signature Date/Time: 01/11/2022/5:42:05 PM    Final    VAS [place] LOWER EXTREMITY VENOUS (DVT)  Result Date: 01/11/2022  Lower Venous DVT Study Patient Name:  Thomas Doyle  Date of Exam:   01/11/2022 Medical Rec #: [id number]       Accession #:    [id number] Date of Birth: 1928-09-28      Patient Gender: M Patient Age:    57 years Exam Location:  [facility] Procedure:      VAS [place] LOWER EXTREMITY VENOUS (DVT) Referring Phys: CHING TU --------------------------------------------------------------------------------  Indications: Edema.  Risk Factors: Cancer. Comparison Study: No prior studies. Performing Technologist: Oliver Hum RVT  Examination Guidelines: A complete evaluation includes B-mode imaging, spectral Doppler, color Doppler, and power Doppler as needed of all accessible portions of each vessel. Bilateral testing is considered an integral part of a complete examination. Limited examinations for reoccurring indications may be performed as noted. The reflux portion of the exam is performed with the patient in reverse Trendelenburg.  +---------+---------------+---------+-----------+----------+--------------+ RIGHT    CompressibilityPhasicitySpontaneityPropertiesThrombus Aging +---------+---------------+---------+-----------+----------+--------------+ CFV      Full           Yes      Yes                                 +---------+---------------+---------+-----------+----------+--------------+ SFJ      Full                                                        +---------+---------------+---------+-----------+----------+--------------+ FV Prox  Full                                                        +---------+---------------+---------+-----------+----------+--------------+  FV Mid   Full                                                        +---------+---------------+---------+-----------+----------+--------------+ FV DistalFull                                                        +---------+---------------+---------+-----------+----------+--------------+ PFV      Full                                                        +---------+---------------+---------+-----------+----------+--------------+ POP      Full           Yes      Yes                                  +---------+---------------+---------+-----------+----------+--------------+ PTV      Full                                                        +---------+---------------+---------+-----------+----------+--------------+ PERO     Full                                                        +---------+---------------+---------+-----------+----------+--------------+   +----+---------------+---------+-----------+----------+--------------+ LEFTCompressibilityPhasicitySpontaneityPropertiesThrombus Aging +----+---------------+---------+-----------+----------+--------------+ CFV Full           Yes      Yes                                 +----+---------------+---------+-----------+----------+--------------+     Summary: RIGHT: - There is no evidence of deep vein thrombosis in the lower extremity.  - No cystic structure found in the popliteal fossa.  LEFT: - No evidence of common femoral vein obstruction.  *See table(s) above for measurements and observations. Electronically signed by Deitra Mayo MD on 01/11/2022 at 10:33:28 AM.    Final    DG Chest Portable 1 View  Result Date: 01/04/2022 CLINICAL DATA:  Productive cough. EXAM: PORTABLE CHEST 1 VIEW COMPARISON:  January 09, 2022 FINDINGS: Moderate to marked severity infiltrates are seen within the bilateral lung bases and upper left lung. This is increased in severity when compared to the prior study. Small, partially layering bilateral pleural effusions are noted. No pneumothorax is identified. The cardiac silhouette is mildly enlarged and unchanged in size. Moderate severity calcification of the aortic arch is noted. The visualized skeletal structures are unremarkable. IMPRESSION: 1. Moderate to marked severity bilateral infiltrates, increased in severity when compared to the prior study. 2. Small, partially layering bilateral pleural  effusions. Electronically Signed   By: Virgina Norfolk M.D.   On: 01/01/2022 19:37     Scheduled Meds:  ALPRAZolam  0.5 mg Oral QHS   amiodarone  300 mg Oral Daily   amLODipine  10 mg Oral Daily   aspirin EC  81 mg Oral Daily   chlorpheniramine-HYDROcodone  5 mL Oral Q12H   cholecalciferol  3,000 Units Oral Daily   enoxaparin (LOVENOX) injection  40 mg Subcutaneous Q24H   furosemide  20 mg Intravenous Once   omega-3 acid ethyl esters  1 g Oral Daily   rOPINIRole  1 mg Oral QHS   vitamin B-12  2,500 mcg Oral Daily   Continuous Infusions:  azithromycin     cefTRIAXone (ROCEPHIN)  IV      LOS: 1 day   Raiford Noble, DO [facility] Available via Epic secure chat 7am-7pm After these hours, please refer to coverage provider listed on [email or web address] 01/11/2022, 7:39 PM

## 2022-01-11 NOTE — Assessment & Plan Note (Signed)
-   Patient's hemoglobin/hematocrit is now trending down and went from 11.6/34.7 -> 11.2/34.0 -> 9.8/29.5 -Check anemia panel in the a.m. -Continue to monitor for signs and symptoms of bleeding; no overt bleeding noted -Repeat CBC in the a.m.

## 2022-01-11 NOTE — Hospital Course (Signed)
HPI per Dr. Ileene Musa on 01/21/2022  Thomas Doyle is a 88 y.o. male with medical history significant of large B-cell lymphoma stage 4 not on treatment following palliative care, non-hodgkin's lymphoma, paroxysmal A.fib (not on anticoagulation), COPD, HTN who presents with worsening cough and shortness of breath.   Symptoms have been ongoing for the past 2 weeks.  Reports severe cough productive of brown thick sputum.  Has increasing shortness of breath both at rest and with exertion.  No fever.  No chest pain.  Has noticed some increased swelling of the legs for the past 3 to 5 months.  Also has noticed decrease sensation to the right lower extremity from the mid pretibial region down to his feet for the past 3 months.  Today felt like he was "dying."  He is on palliative care outpatient and now expressed wishes to be on hospice following discharge.   He was evaluated in the ED yesterday with chest x-ray showing possible pulmonary interstitial edema.  He was discharged on Augmentin.     In the ED he was afebrile, normotensive but tachypneic initially hypoxic down to 66% on room air requiring 10 L via nonrebreather.  Has been weaned down to 6 L via nasal cannula. Chest x-ray shows moderate to severe bilateral infiltrate with small layering bilateral effusion.  COVID PCR is negative.  BNP of 294.   No leukocytosis, hemoglobin 11.2.  BMP unremarkable.  Mildly elevated total bilirubin 1.6.   He was started on IV azithromycin and Rocephin in the ED. As mentioned in the history of present illness. All other systems reviewed and are negative.  **Interim History   BNP was elevated so he started on some diuresis which was very gentle with IV Lasix 20 mg daily.  He received a dose yesterday and today.  Was also given a dose of Solu-Medrol and initiated on nebs.  Started on antibiotics with ceftriaxone and azithromycin.  Continues to have a significant cough.  Right lower extremity venous duplex showed no  evidence of DVT.  Echocardiogram done and showed a normal EF but did show grade 2 diastolic dysfunction.  Palliative Care has been consulted for further goals of care discussion.

## 2022-01-11 NOTE — ED Notes (Signed)
Cleaned pt. Changed linens and placed male purewick.

## 2022-01-12 ENCOUNTER — Inpatient Hospital Stay (HOSPITAL_COMMUNITY): Payer: Medicare Other

## 2022-01-12 DIAGNOSIS — I10 Essential (primary) hypertension: Secondary | ICD-10-CM | POA: Diagnosis not present

## 2022-01-12 DIAGNOSIS — Z66 Do not resuscitate: Secondary | ICD-10-CM

## 2022-01-12 DIAGNOSIS — Z515 Encounter for palliative care: Secondary | ICD-10-CM

## 2022-01-12 DIAGNOSIS — Z7189 Other specified counseling: Secondary | ICD-10-CM

## 2022-01-12 DIAGNOSIS — C8510 Unspecified B-cell lymphoma, unspecified site: Secondary | ICD-10-CM | POA: Diagnosis not present

## 2022-01-12 DIAGNOSIS — D649 Anemia, unspecified: Secondary | ICD-10-CM | POA: Diagnosis not present

## 2022-01-12 DIAGNOSIS — J9601 Acute respiratory failure with hypoxia: Secondary | ICD-10-CM | POA: Diagnosis not present

## 2022-01-12 LAB — CBC WITH DIFFERENTIAL/PLATELET
Abs Immature Granulocytes: 0.04 10*3/uL (ref 0.00–0.07)
Basophils Absolute: 0 10*3/uL (ref 0.0–0.1)
Basophils Relative: 0 %
Eosinophils Absolute: 0.1 10*3/uL (ref 0.0–0.5)
Eosinophils Relative: 1 %
HCT: 30.2 % — ABNORMAL LOW (ref 39.0–52.0)
Hemoglobin: 9.9 g/dL — ABNORMAL LOW (ref 13.0–17.0)
Immature Granulocytes: 0 %
Lymphocytes Relative: 8 %
Lymphs Abs: 0.7 10*3/uL (ref 0.7–4.0)
MCH: 32.8 pg (ref 26.0–34.0)
MCHC: 32.8 g/dL (ref 30.0–36.0)
MCV: 100 fL (ref 80.0–100.0)
Monocytes Absolute: 0.6 10*3/uL (ref 0.1–1.0)
Monocytes Relative: 6 %
Neutro Abs: 8 10*3/uL — ABNORMAL HIGH (ref 1.7–7.7)
Neutrophils Relative %: 85 %
Platelets: 229 10*3/uL (ref 150–400)
RBC: 3.02 MIL/uL — ABNORMAL LOW (ref 4.22–5.81)
RDW: 13.4 % (ref 11.5–15.5)
WBC: 9.5 10*3/uL (ref 4.0–10.5)
nRBC: 0 % (ref 0.0–0.2)

## 2022-01-12 LAB — COMPREHENSIVE METABOLIC PANEL
ALT: 40 U/L (ref 0–44)
AST: 43 U/L — ABNORMAL HIGH (ref 15–41)
Albumin: 2.6 g/dL — ABNORMAL LOW (ref 3.5–5.0)
Alkaline Phosphatase: 83 U/L (ref 38–126)
Anion gap: 7 (ref 5–15)
BUN: 27 mg/dL — ABNORMAL HIGH (ref 8–23)
CO2: 26 mmol/L (ref 22–32)
Calcium: 8 mg/dL — ABNORMAL LOW (ref 8.9–10.3)
Chloride: 104 mmol/L (ref 98–111)
Creatinine, Ser: 1.14 mg/dL (ref 0.61–1.24)
GFR, Estimated: >60 mL/min (ref >60)
Glucose, Bld: 116 mg/dL — ABNORMAL HIGH (ref 70–99)
Potassium: 4 mmol/L (ref 3.5–5.1)
Sodium: 137 mmol/L (ref 135–145)
Total Bilirubin: 0.7 mg/dL (ref 0.3–1.2)
Total Protein: 5.2 g/dL — ABNORMAL LOW (ref 6.5–8.1)

## 2022-01-12 LAB — MAGNESIUM: Magnesium: 2.1 mg/dL (ref 1.7–2.4)

## 2022-01-12 LAB — BRAIN NATRIURETIC PEPTIDE: B Natriuretic Peptide: 220.7 pg/mL — ABNORMAL HIGH (ref 0.0–100.0)

## 2022-01-12 LAB — PHOSPHORUS: Phosphorus: 3.4 mg/dL (ref 2.5–4.6)

## 2022-01-12 MED ORDER — ONDANSETRON HCL 4 MG/2ML IJ SOLN
4.0000 mg | Freq: Four times a day (QID) | INTRAMUSCULAR | Status: DC | PRN
Start: 2022-01-12 — End: 2022-01-17
  Administered 2022-01-12 – 2022-01-16 (×2): 4 mg via INTRAVENOUS
  Filled 2022-01-12 (×2): qty 2

## 2022-01-12 MED ORDER — MORPHINE SULFATE (PF) 2 MG/ML IV SOLN
1.0000 mg | INTRAVENOUS | Status: DC | PRN
  Administered 2022-01-14 – 2022-01-17 (×8): 1 mg via INTRAVENOUS
  Filled 2022-01-12 (×8): qty 1

## 2022-01-12 MED ORDER — BUDESONIDE 0.25 MG/2ML IN SUSP
0.2500 mg | Freq: Two times a day (BID) | RESPIRATORY_TRACT | Status: DC
  Administered 2022-01-12 – 2022-01-17 (×11): 0.25 mg via RESPIRATORY_TRACT
  Filled 2022-01-12 (×11): qty 2

## 2022-01-12 MED ORDER — ALBUTEROL SULFATE (2.5 MG/3ML) 0.083% IN NEBU
2.5000 mg | INHALATION_SOLUTION | RESPIRATORY_TRACT | Status: DC | PRN
Start: 2022-01-12 — End: 2022-01-17

## 2022-01-12 MED ORDER — IPRATROPIUM-ALBUTEROL 0.5-2.5 (3) MG/3ML IN SOLN
3.0000 mL | Freq: Four times a day (QID) | RESPIRATORY_TRACT | Status: DC
  Administered 2022-01-12 – 2022-01-17 (×18): 3 mL via RESPIRATORY_TRACT
  Filled 2022-01-12 (×19): qty 3

## 2022-01-12 MED ORDER — IOHEXOL 300 MG/ML  SOLN
100.0000 mL | Freq: Once | INTRAMUSCULAR | Status: AC | PRN
  Administered 2022-01-12: 100 mL via INTRAVENOUS

## 2022-01-12 MED ORDER — FUROSEMIDE 10 MG/ML IJ SOLN
60.0000 mg | Freq: Once | INTRAMUSCULAR | Status: AC
Start: 2022-01-12 — End: 2022-01-12
  Administered 2022-01-12: 60 mg via INTRAVENOUS
  Filled 2022-01-12: qty 6

## 2022-01-12 MED ORDER — LOPERAMIDE HCL 2 MG PO CAPS: 2.0000 mg | ORAL_CAPSULE | Freq: Four times a day (QID) | ORAL | Status: DC | PRN

## 2022-01-12 NOTE — Consult Note (Cosign Needed)
Consultation Note Date: 01/12/2022   Patient Name: Thomas Doyle  DOB: 1929-03-07  MRN: [id number]  Age / Sex: 88 y.o., male  PCP: Leeroy Cha, MD Referring Physician: Kathie Dike, MD  Reason for Consultation: Establishing goals of care  HPI/Patient Profile: 88 y.o. male  with past medical history of large B-cel lymphoma stage 4 (not on active treatment), NHL, paroxysmal atrial fibrillation, COPD, HTN admitted on 12/24/2021 with worsening shortness of breath at rest and with exertion with increased swelling of legs over past months. Admitted with acute respiratory failure related to pneumonia/COPD vs CHF. MOST reviewed. Noted patient stated interest in pursuing hospice support.   Clinical Assessment and Goals of Care: I met today with Thomas Doyle along with his 2 daughters at bedside. He is smiling and talkative and in good spirits. He reports that he had an episode this morning with his breathing but feels much better now. Family at bedside explain that he has been doing very well with good functional status and good quality of life until ~2 weeks ago when he began to have diarrhea and nausea and difficulty keeping food down. This began to worsen into shortness of breath and BLE edema. We discussed his underlying cancer, heart failure, COPD, and now pneumonia. We discussed using antibiotics, Lasix, breathing treatments and oxygen to try and help him to improve.   We discussed goals of care and they all agree that he would benefit from hospice support when discharged from the hospital. They are all very hopeful that he can improve to return home. Very important that he does not die in the hospital but wants to die at home. However, they are all also hopeful that he still has more time. They do all support DNR/DNI but open to move aggressive interventions temporarily if thought to help him in a reversible  condition and to return home. He lives with his daughter Thayer Headings. He talks openly about dying and joining his wife in heaven but also talks of wanting more time and to feel better.   All questions/concerns addressed. Emotional support provided.   Primary Decision Maker PATIENT    SUMMARY OF RECOMMENDATIONS   - DNR/DNI confirmed - Hopeful for improvement - Desires hospice support at time of discharge  Code Status/Advance Care Planning: DNR   Symptom Management:  Agree with low dose morphine to assist with any further dyspnea.   Palliative Prophylaxis:  Aspiration, Bowel Regimen, Delirium Protocol, Frequent Pain Assessment, and Turn Reposition  Prognosis:  < 6 months possible  Discharge Planning: Home with Hospice      Primary Diagnoses: Present on Admission:  Acute respiratory failure with hypoxia (HCC)  Essential hypertension  Large B-cell lymphoma (HCC)  Paroxysmal atrial fibrillation (HCC)  Normocytic anemia   I have reviewed the medical record, interviewed the patient and family, and examined the patient. The following aspects are pertinent.  Past Medical History:  Diagnosis Date   AAA (abdominal aortic aneurysm) (HCC)    intrarenal   Anemia    COPD (chronic obstructive  pulmonary disease) [facility]) dx April 2013   Hypertension    Large B-cell lymphoma ([place]) 11/2011   Mitral valve prolapse    Non Hodgkin's lymphoma (HCC)    PAF (paroxysmal atrial fibrillation) (HCC)    On amiodarone, digoxin. Not on coumadin   Pneumonia April 2013   Right bundle branch block (RBBB)    Sick sinus syndrome [facility])    Social History   Socioeconomic History   Marital status: Widowed    Spouse name: Not on file   Number of children: 2   Years of education: 14   Highest education level: Not on file  Occupational History   Occupation: Retired from [employer] Use   Smoking status: Never   Smokeless tobacco: Never  Substance and Sexual Activity   Alcohol use: No   Drug use:  No   Sexual activity: Not on file  Other Topics Concern   Not on file  Social History Narrative   Widowed x 3 years.  Lives alone.  Independent of ADLs and ambulation.  Emergency Contact: Quinlan Vollmer ([place]): [phone]. Has two daughters. Still living at home, doesn't use cane/walker.             Social Determinants of Health   Financial Resource Strain: Not on file  Food Insecurity: Not on file  Transportation Needs: Not on file  Physical Activity: Not on file  Stress: Not on file  Social Connections: Not on file   Family History  Problem Relation Age of Onset   Atrial fibrillation Mother    Cancer Father    Heart attack Father    Scheduled Meds:  ALPRAZolam  0.5 mg Oral QHS   amiodarone  300 mg Oral Daily   amLODipine  10 mg Oral Daily   aspirin EC  81 mg Oral Daily   chlorpheniramine-HYDROcodone  5 mL Oral Q12H   cholecalciferol  3,000 Units Oral Daily   enoxaparin (LOVENOX) injection  40 mg Subcutaneous Q24H   omega-3 acid ethyl esters  1 g Oral Daily   rOPINIRole  1 mg Oral QHS   vitamin B-12  2,500 mcg Oral Daily   Continuous Infusions:  azithromycin 500 mg (01/12/22 0054)   cefTRIAXone (ROCEPHIN)  IV 1 g (01/11/22 2234)   PRN Meds:.acetaminophen, bismuth subsalicylate, ipratropium-albuterol, ondansetron (ZOFRAN) IV, tamsulosin Allergies  Allergen Reactions   Codeine Nausea Only   Review of Systems  Constitutional:  Positive for activity change and appetite change.  Respiratory:  Positive for cough and shortness of breath.   Gastrointestinal:  Positive for diarrhea and nausea.    Physical Exam Vitals and nursing note reviewed.  Constitutional:      General: He is not in acute distress.    Appearance: He is ill-appearing.  Cardiovascular:     Rate and Rhythm: Normal rate.  Pulmonary:     Effort: No tachypnea, accessory muscle usage or respiratory distress.     Comments: 15L Abdominal:     Palpations: Abdomen is soft.  Neurological:     Mental  Status: He is alert and oriented to person, place, and time.     Comments: Some moments of confusion during conversation     Vital Signs: BP (!) 111/46   Pulse 72   Temp 98.6 F (37 C) (Oral)   Resp (!) 21   Ht _0  (1.778 m)   Wt 66.9 kg   SpO2 96%   BMI 21.16 kg/m  Pain Scale: 0-10   Pain Score: 0-No pain  SpO2: SpO2: 96 % O2 Device:SpO2: 96 % O2 Flow Rate: .O2 Flow Rate (L/min): 15 L/min  IO: Intake/output summary:  Intake/Output Summary (Last 24 hours) at 01/12/2022 1021 Last data filed at 01/12/2022 0700 Gross per 24 hour  Intake --  Output 702 ml  Net -702 ml    LBM:   Baseline Weight: Weight: 65.8 kg Most recent weight: Weight: 66.9 kg     Palliative Assessment/Data:     Time In: 1015  Time Total: 75 min  Greater than 50%  of this time was spent counseling and coordinating care related to the above assessment and plan.  Signed by: Vinie Sill, NP Palliative Medicine Team Pager # [phone] (M-F 8a-5p) Team Phone # [phone] (Nights/Weekends)

## 2022-01-12 NOTE — Progress Notes (Signed)
PROGRESS NOTE    Thomas Doyle  PIR:[id number] DOB: 05/11/1929 DOA: 01/16/2022 PCP: Leeroy Cha, MD    Brief Narrative:  HPI per Dr. Ileene Musa on 01/09/2022   Thomas Doyle is a 88 y.o. male with medical history significant of large B-cell lymphoma stage 4 not on treatment following palliative care, non-hodgkin's lymphoma, paroxysmal A.fib (not on anticoagulation), COPD, HTN who presents with worsening cough and shortness of breath.   Symptoms have been ongoing for the past 2 weeks.  Reports severe cough productive of brown thick sputum.  Has increasing shortness of breath both at rest and with exertion.  No fever.  No chest pain.  Has noticed some increased swelling of the legs for the past 3 to 5 months.  Also has noticed decrease sensation to the right lower extremity from the mid pretibial region down to his feet for the past 3 months.  Today felt like he was "dying."  He is on palliative care outpatient and now expressed wishes to be on hospice following discharge.   He was evaluated in the ED yesterday with chest x-ray showing possible pulmonary interstitial edema.  He was discharged on Augmentin.     In the ED he was afebrile, normotensive but tachypneic initially hypoxic down to 66% on room air requiring 10 L via nonrebreather.  Has been weaned down to 6 L via nasal cannula. Chest x-ray shows moderate to severe bilateral infiltrate with small layering bilateral effusion.  COVID PCR is negative.  BNP of 294.   No leukocytosis, hemoglobin 11.2.  BMP unremarkable.  Mildly elevated total bilirubin 1.6.   He was started on IV azithromycin and Rocephin in the ED.   Assessment & Plan:   Principal Problem:   Acute respiratory failure with hypoxia (HCC) Active Problems:   Normocytic anemia   Essential hypertension   Large B-cell lymphoma (HCC)   Paroxysmal atrial fibrillation (HCC)   Paresthesia   Acute respiratory failure with hypoxia -Felt to be secondary to pneumonia  versus a component of CHF -Currently has increasing oxygen requirement -Continue antibiotics Lasix -Continue to wean down oxygen as tolerated -Currently on high flow nasal cannula at 15 L  Community-acquired pneumonia -Continue with ceftriaxone and azithromycin -Continue bronchodilators  Acute diastolic congestive heart failure -Echo with normal ejection fraction grade 2 diastolic dysfunction --Receiving IV Lasix, continue the same -BNP is trending down  Nausea, vomiting -Abdominal x-ray unrevealing -We will check CT abdomen  Paroxysmal atrial fibrillation -Currently on amiodarone -Not on chronic anticoagulation due to patient wishes  Large B-cell lymphoma -Currently, he is not followed by oncology -Followed by palliative care as an outpatient  Paresthesias -Venous Dopplers negative for DVT -Suspect that he has 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  Their desire is for patient to improve enough to discharge home with hospice services.   DVT prophylaxis: enoxaparin (LOVENOX) injection 40 mg Start: 01/11/22 1000  Code Status: DNR Family Communication: Daughter at the bedside Disposition Plan: Status is: Inpatient Remains inpatient appropriate because: Continued hypoxia/worsening oxygen requirement     Consultants:  Palliative care  Procedures:    Antimicrobials:  Ceftriaxone 6/20> Azithromycin 6/20>   Subjective: Continues to feel short of breath.  Increasing oxygen requirement overnight.  He does have a productive cough.  Objective: Vitals:   01/12/22 0704 01/12/22 1253 01/12/22 1402 01/12/22 1404  BP:  110/61     Pulse:  74    Resp: (!) '21 20 16   '$ Temp:  98.4 F (36.9 C)    TempSrc:  Oral    SpO2: 96% 95% 96% 96%  Weight:      Height:        Intake/Output Summary (Last 24 hours) at 01/12/2022 1931 Last data filed at 01/12/2022 1759 Gross per 24 hour  Intake --  Output 1402 ml  Net -1402 ml   Filed Weights   01/09/2022 1905 01/11/22 2054  Weight: 65.8 kg 66.9 kg    Examination:  General exam: Appears calm and comfortable  Respiratory system: Crackles at bases. Respiratory effort normal. Cardiovascular system: S1 & S2 heard, RRR. No JVD, murmurs, rubs, gallops or clicks. No pedal edema. Gastrointestinal system: Abdomen is nondistended, soft and nontender. No organomegaly or masses felt. Normal bowel sounds heard. Central nervous system: Alert and oriented. No focal neurological deficits. Extremities: Symmetric 5 x 5 power. Skin: No rashes, lesions or ulcers Psychiatry: Judgement and insight appear normal. Mood & affect appropriate.     Data Reviewed: I have personally reviewed following labs and imaging studies  CBC: Recent Labs  Lab 01/09/22 1146 12/30/2021 1916 01/11/22 0421 01/12/22 0424  WBC 6.2 6.2 6.4 9.5  NEUTROABS 4.8 5.1  --  8.0*  HGB 11.6* 11.2* 9.8* 9.9*  HCT 34.7* 34.0* 29.5* 30.2*  MCV 99.4 100.0 99.7 100.0  PLT 202 207 192 093   Basic Metabolic Panel: Recent Labs  Lab 01/09/22 1146 12/31/2021 1916 01/11/22 0421 01/12/22 0424  NA 135 134* 136 137  K 4.3 3.9 3.8 4.0  CL 103 105 105 104  CO2 24 21* 23 26  GLUCOSE 110* 118* 131* 116*  BUN '15 22 19 '$ 27*  CREATININE 1.15 1.11 1.17 1.14  CALCIUM 8.5* 8.0* 7.8* 8.0*  MG  --   --   --  2.1  PHOS  --   --   --  3.4   GFR: Estimated Creatinine Clearance: 40.8 mL/min (by C-G formula based on SCr of 1.14 mg/dL). Liver Function Tests: Recent Labs  Lab 01/09/22 1146 01/03/2022 1916 01/11/22 0421 01/12/22 0424  AST 54* 48* 45* 43*  ALT 47* 41 39 40  ALKPHOS 108 100 90 83  BILITOT 1.4* 1.6* 1.1 0.7  PROT  6.4* 5.9* 5.3* 5.2*  ALBUMIN 3.6 3.2* 2.7* 2.6*   No results for input(s): "LIPASE", "AMYLASE" in the last 168 hours. No results for input(s): "AMMONIA" in the last 168 hours. Coagulation Profile: No results for input(s): "INR", "PROTIME" in the last 168 hours. Cardiac Enzymes: No results for input(s): "CKTOTAL", "CKMB", "CKMBINDEX", "TROPONINI" in the last 168 hours. BNP (last 3 results) No results for input(s): "PROBNP" in the last 8760 hours. HbA1C: No results for input(s): "HGBA1C" in the last 72 hours. CBG: No results for input(s): "GLUCAP" in the last 168 hours. Lipid Profile: No results for input(s): "CHOL", "HDL", "LDLCALC", "TRIG", "CHOLHDL", "LDLDIRECT" in the last 72 hours. Thyroid Function Tests: No results for input(s): "TSH", "T4TOTAL", "FREET4", "T3FREE", "THYROIDAB" in the last 72 hours. Anemia Panel: No results for input(s): "VITAMINB12", "FOLATE", "FERRITIN", "TIBC", "IRON", "RETICCTPCT" in the last 72 hours. Sepsis Labs: Recent Labs  Lab 01/09/22 1147 01/06/2022 1917 01/21/2022 2100  LATICACIDVEN 1.5 1.1 1.2    Recent Results (from the past 240 hour(s))  Culture, blood (routine  x 2)     Status: None (Preliminary result)   Collection Time: 12/24/2021  7:13 PM   Specimen: BLOOD  Result Value Ref Range Status   Specimen Description   Final    BLOOD RIGHT ANTECUBITAL Performed at [facility] [address] [place], [place] [address]    Special Requests   Final    BOTTLES DRAWN AEROBIC AND ANAEROBIC Blood Culture adequate volume Performed at [facility] [address] [place], [place] [address]    Culture   Final    NO GROWTH 2 DAYS Performed at [facility] [address], [place], [place] [address]    Report Status PENDING  Incomplete  Culture, blood (routine x 2)     Status: None (Preliminary result)   Collection Time: 01/06/2022  7:13 PM   Specimen: BLOOD  Result Value Ref Range Status   Specimen  Description   Final    BLOOD BLOOD LEFT FOREARM Performed at [facility] [address], [place], [place] [address]    Special Requests   Final    BOTTLES DRAWN AEROBIC AND ANAEROBIC Blood Culture adequate volume Performed at [facility] [address] [place], [place] [address]    Culture   Final    NO GROWTH 2 DAYS Performed at [facility] [address] [place], [place] [address]    Report Status PENDING  Incomplete  Resp Panel by RT-PCR (Flu A&B, Covid) Anterior Nasal Swab     Status: None   Collection Time: 12/28/2021  7:18 PM   Specimen: Anterior Nasal Swab  Result Value Ref Range Status   SARS Coronavirus 2 by RT PCR NEGATIVE NEGATIVE Final    Comment: (NOTE) SARS-CoV-2 target nucleic acids are NOT DETECTED.  The SARS-CoV-2 RNA is generally detectable in upper respiratory specimens during the acute phase of infection. The lowest concentration of SARS-CoV-2 viral copies this assay can detect is 138 copies/mL. A negative result does not preclude SARS-Cov-2 infection and should not be used as the sole basis for treatment or other patient management decisions. A negative result may occur with  improper specimen collection/handling, submission of specimen other than nasopharyngeal swab, presence of viral mutation(s) within the areas targeted by this assay, and inadequate number of viral copies(<138 copies/mL). A negative result must be combined with clinical observations, patient history, and epidemiological information. The expected result is Negative.  Fact Sheet for Patients:  EntrepreneurPulse.com.au  Fact Sheet for Healthcare Providers:  IncredibleEmployment.be  This test is no t yet approved or cleared by the [place] FDA and  has been authorized for detection and/or diagnosis of SARS-CoV-2 by FDA under an Emergency Use Authorization (EUA). This EUA will remain  in effect  (meaning this test can be used) for the duration of the COVID-19 declaration under Section 564(b)(1) of the Act, 21 U.S.C.section 360bbb-3(b)(1), unless the authorization is terminated  or revoked sooner.       Influenza A by PCR NEGATIVE NEGATIVE Final   Influenza B by PCR NEGATIVE NEGATIVE Final    Comment: (NOTE) The Xpert Xpress SARS-CoV-2/FLU/RSV plus assay is intended as an aid in the diagnosis of influenza from Nasopharyngeal swab specimens and should not be used as a sole basis for treatment. Nasal washings and aspirates are unacceptable for Xpert Xpress SARS-CoV-2/FLU/RSV testing.  Fact Sheet for Patients: EntrepreneurPulse.com.au  Fact Sheet for Healthcare Providers: IncredibleEmployment.be  This test is not yet approved or cleared by the  [place] Architectural technologist and has been authorized for detection and/or diagnosis of SARS-CoV-2 by FDA under an Print production planner (EUA). This EUA will remain in effect (meaning this test can be used) for the duration of the COVID-19 declaration under Section 564(b)(1) of the Act, 21 U.S.C. section 360bbb-3(b)(1), unless the authorization is terminated or revoked.  Performed at [facility], [place] [address] [place], [place] [address]          Radiology Studies: DG Abd Portable 1V  Result Date: 01/12/2022 CLINICAL DATA:  Nausea and vomiting. EXAM: PORTABLE ABDOMEN - 1 VIEW COMPARISON:  CT abdomen May 04, 2020 FINDINGS: The bowel gas pattern is normal. Cholecystectomy clips. Levoconvex curvature of the thoracolumbar spine with associated degenerative change. Pelvic phleboliths. IMPRESSION: No evidence of bowel obstruction. Electronically Signed   By: Dahlia Bailiff M.D.   On: 01/12/2022 11:54   DG CHEST PORT 1 VIEW  Result Date: 01/12/2022 CLINICAL DATA:  Shortness of breath EXAM: PORTABLE CHEST 1 VIEW COMPARISON:  01/03/2022 FINDINGS: Persistent bilateral opacities.  Worsening aeration in the upper lungs. Probable small pleural effusions. Stable cardiomediastinal contours. IMPRESSION: Worsening lung aeration. Probable small pleural effusions as before. Electronically Signed   By: Macy Mis M.D.   On: 01/12/2022 08:25   ECHOCARDIOGRAM COMPLETE  Result Date: 01/11/2022    ECHOCARDIOGRAM REPORT   Patient Name:   AKEEM HEPPLER Date of Exam: 01/11/2022 Medical Rec #:  [id number]      Height:       70.0 in Accession #:    [id number]     Weight:       145.0 lb Date of Birth:  07-Jun-1929     BSA:          1.821 m Patient Age:    77 years       BP:           99/48 mmHg Patient Gender: M              HR:           69 bpm. Exam Location:  Inpatient Procedure: 2D Echo, Cardiac Doppler and Color Doppler Indications:    Dyspnea  History:        Patient has prior history of Echocardiogram examinations, most                 recent 04/05/2012. COPD, Arrythmias:Atrial Fibrillation; Risk                 Factors:Hypertension.  Sonographer:    Jefferey Pica Referring Phys: [id number] [place]  1. Left ventricular ejection fraction, by estimation, is 55 to 60%. The left ventricle has normal function. The left ventricle has no regional wall motion abnormalities. Left ventricular diastolic parameters are consistent with Grade II diastolic dysfunction (pseudonormalization).  2. Right ventricular systolic function is normal. The right ventricular size is normal. There is moderately elevated pulmonary artery systolic pressure. The estimated right ventricular systolic pressure is 59.5 mmHg.  3. Left atrial size was mildly dilated.  4. The mitral valve is normal in structure. No evidence of mitral valve regurgitation. No evidence of mitral stenosis. Moderate mitral annular calcification.  5. Tricuspid valve regurgitation is mild to moderate.  6. The aortic valve is tricuspid. There is mild calcification of the aortic valve. There is mild thickening of the aortic valve. Aortic valve  regurgitation is mild. Aortic valve sclerosis is present, with no evidence of aortic valve stenosis. Comparison(s): Unable to access  2013 images. FINDINGS  Left Ventricle: Left ventricular ejection fraction, by estimation, is 55 to 60%. The left ventricle has normal function. The left ventricle has no regional wall motion abnormalities. The left ventricular internal cavity size was normal in size. There is  no left ventricular hypertrophy. Left ventricular diastolic parameters are consistent with Grade II diastolic dysfunction (pseudonormalization). Right Ventricle: The right ventricular size is normal. No increase in right ventricular wall thickness. Right ventricular systolic function is normal. There is moderately elevated pulmonary artery systolic pressure. The tricuspid regurgitant velocity is 3.20 m/s, and with an assumed right atrial pressure of 8 mmHg, the estimated right ventricular systolic pressure is 89.2 mmHg. Left Atrium: Left atrial size was mildly dilated. Right Atrium: Right atrial size was normal in size. Pericardium: There is no evidence of pericardial effusion. Mitral Valve: The mitral valve is normal in structure. Moderate mitral annular calcification. No evidence of mitral valve regurgitation. No evidence of mitral valve stenosis. Tricuspid Valve: The tricuspid valve is normal in structure. Tricuspid valve regurgitation is mild to moderate. No evidence of tricuspid stenosis. Aortic Valve: The aortic valve is tricuspid. There is mild calcification of the aortic valve. There is mild thickening of the aortic valve. There is mild aortic valve annular calcification. Aortic valve regurgitation is mild. Aortic valve sclerosis is present, with no evidence of aortic valve stenosis. Aortic valve peak gradient measures 13.8 mmHg. Pulmonic Valve: The pulmonic valve was grossly normal. Pulmonic valve regurgitation is mild. No evidence of pulmonic stenosis. Aorta: The aortic root and ascending aorta are  structurally normal, with no evidence of dilitation. IAS/Shunts: No atrial level shunt detected by color flow Doppler.  LEFT VENTRICLE PLAX 2D LVIDd:         5.70 cm   Diastology LVIDs:         4.10 cm   LV e' medial:    7.62 cm/s LV PW:         0.80 cm   LV E/e' medial:  8.9 LV IVS:        0.90 cm   LV e' lateral:   12.90 cm/s LVOT diam:     1.90 cm   LV E/e' lateral: 5.3 LV SV:         92 LV SV Index:   51 LVOT Area:     2.84 cm  RIGHT VENTRICLE             IVC RV S prime:     13.70 cm/s  IVC diam: 2.00 cm TAPSE (M-mode): 2.1 cm LEFT ATRIUM           Index        RIGHT ATRIUM           Index LA diam:      4.60 cm 2.53 cm/m   RA Area:     15.60 cm LA Vol (A2C): 53.2 ml 29.22 ml/m  RA Volume:   40.30 ml  22.13 ml/m LA Vol (A4C): 72.7 ml 39.93 ml/m  AORTIC VALVE                 PULMONIC VALVE AV Area (Vmax): 2.01 cm     PV Vmax:       1.07 m/s AV Vmax:        186.00 cm/s  PV Peak grad:  4.6 mmHg AV Peak Grad:   13.8 mmHg LVOT Vmax:      132.00 cm/s LVOT Vmean:     87.600 cm/s LVOT VTI:  0.326 m  AORTA Ao Root diam: 3.20 cm Ao Asc diam:  3.70 cm MITRAL VALVE               TRICUSPID VALVE MV Area (PHT): 3.77 cm    TR Peak grad:   41.0 mmHg MV Decel Time: 201 msec    TR Vmax:        320.00 cm/s MV E velocity: 67.90 cm/s MV A velocity: 53.70 cm/s  SHUNTS MV E/A ratio:  1.26        Systemic VTI:  0.33 m                            Systemic Diam: 1.90 cm Rudean Haskell MD Electronically signed by Rudean Haskell MD Signature Date/Time: 01/11/2022/5:42:05 PM    Final    VAS [place] LOWER EXTREMITY VENOUS (DVT)  Result Date: 01/11/2022  Lower Venous DVT Study Patient Name:  CHRISTYAN REGER  Date of Exam:   01/11/2022 Medical Rec #: [id number]       Accession #:    [id number] Date of Birth: 1928/11/04      Patient Gender: M Patient Age:   59 years Exam Location:  [facility] Procedure:      VAS [place] LOWER EXTREMITY VENOUS (DVT) Referring Phys: CHING TU  --------------------------------------------------------------------------------  Indications: Edema.  Risk Factors: Cancer. Comparison Study: No prior studies. Performing Technologist: Oliver Hum RVT  Examination Guidelines: A complete evaluation includes B-mode imaging, spectral Doppler, color Doppler, and power Doppler as needed of all accessible portions of each vessel. Bilateral testing is considered an integral part of a complete examination. Limited examinations for reoccurring indications may be performed as noted. The reflux portion of the exam is performed with the patient in reverse Trendelenburg.  +---------+---------------+---------+-----------+----------+--------------+ RIGHT    CompressibilityPhasicitySpontaneityPropertiesThrombus Aging +---------+---------------+---------+-----------+----------+--------------+ CFV      Full           Yes      Yes                                 +---------+---------------+---------+-----------+----------+--------------+ SFJ      Full                                                        +---------+---------------+---------+-----------+----------+--------------+ FV Prox  Full                                                        +---------+---------------+---------+-----------+----------+--------------+ FV Mid   Full                                                        +---------+---------------+---------+-----------+----------+--------------+ FV DistalFull                                                        +---------+---------------+---------+-----------+----------+--------------+  PFV      Full                                                        +---------+---------------+---------+-----------+----------+--------------+ POP      Full           Yes      Yes                                 +---------+---------------+---------+-----------+----------+--------------+ PTV      Full                                                         +---------+---------------+---------+-----------+----------+--------------+ PERO     Full                                                        +---------+---------------+---------+-----------+----------+--------------+   +----+---------------+---------+-----------+----------+--------------+ LEFTCompressibilityPhasicitySpontaneityPropertiesThrombus Aging +----+---------------+---------+-----------+----------+--------------+ CFV Full           Yes      Yes                                 +----+---------------+---------+-----------+----------+--------------+     Summary: RIGHT: - There is no evidence of deep vein thrombosis in the lower extremity.  - No cystic structure found in the popliteal fossa.  LEFT: - No evidence of common femoral vein obstruction.  *See table(s) above for measurements and observations. Electronically signed by Deitra Mayo MD on 01/11/2022 at 10:33:28 AM.    Final         Scheduled Meds:  ALPRAZolam  0.5 mg Oral QHS   amiodarone  300 mg Oral Daily   amLODipine  10 mg Oral Daily   aspirin EC  81 mg Oral Daily   budesonide (PULMICORT) nebulizer solution  0.25 mg Nebulization BID   chlorpheniramine-HYDROcodone  5 mL Oral Q12H   cholecalciferol  3,000 Units Oral Daily   enoxaparin (LOVENOX) injection  40 mg Subcutaneous Q24H   ipratropium-albuterol  3 mL Nebulization Q6H   omega-3 acid ethyl esters  1 g Oral Daily   rOPINIRole  1 mg Oral QHS   vitamin B-12  2,500 mcg Oral Daily   Continuous Infusions:  azithromycin 500 mg (01/12/22 0054)   cefTRIAXone (ROCEPHIN)  IV 1 g (01/11/22 2234)     LOS: 2 days    Time spent: 36mns    JKathie Dike MD [facility]   If 7PM-7AM, please contact night-coverage [email or web address]  01/12/2022, 7:31 PM

## 2022-01-12 NOTE — Progress Notes (Signed)
Rt called to room first thing to assess patient. Patient saturation was 77% on 9L [place]. The patient was placed on 15L [place] and given a duoneb treatment. Patient post treatment saturation 96 on 15 L [place]. RT will continue to monitor

## 2022-01-13 ENCOUNTER — Inpatient Hospital Stay (HOSPITAL_COMMUNITY): Payer: Medicare Other

## 2022-01-13 DIAGNOSIS — I10 Essential (primary) hypertension: Secondary | ICD-10-CM | POA: Diagnosis not present

## 2022-01-13 DIAGNOSIS — D649 Anemia, unspecified: Secondary | ICD-10-CM | POA: Diagnosis not present

## 2022-01-13 DIAGNOSIS — Z515 Encounter for palliative care: Secondary | ICD-10-CM | POA: Diagnosis not present

## 2022-01-13 DIAGNOSIS — J9601 Acute respiratory failure with hypoxia: Secondary | ICD-10-CM | POA: Diagnosis not present

## 2022-01-13 DIAGNOSIS — C8510 Unspecified B-cell lymphoma, unspecified site: Secondary | ICD-10-CM | POA: Diagnosis not present

## 2022-01-13 DIAGNOSIS — Z7189 Other specified counseling: Secondary | ICD-10-CM | POA: Diagnosis not present

## 2022-01-13 LAB — COMPREHENSIVE METABOLIC PANEL
ALT: 39 U/L (ref 0–44)
AST: 38 U/L (ref 15–41)
Albumin: 2.4 g/dL — ABNORMAL LOW (ref 3.5–5.0)
Alkaline Phosphatase: 78 U/L (ref 38–126)
Anion gap: 7 (ref 5–15)
BUN: 23 mg/dL (ref 8–23)
CO2: 26 mmol/L (ref 22–32)
Calcium: 7.8 mg/dL — ABNORMAL LOW (ref 8.9–10.3)
Chloride: 99 mmol/L (ref 98–111)
Creatinine, Ser: 1.03 mg/dL (ref 0.61–1.24)
GFR, Estimated: >60 mL/min (ref >60)
Glucose, Bld: 94 mg/dL (ref 70–99)
Potassium: 4.1 mmol/L (ref 3.5–5.1)
Sodium: 132 mmol/L — ABNORMAL LOW (ref 135–145)
Total Bilirubin: 0.8 mg/dL (ref 0.3–1.2)
Total Protein: 4.9 g/dL — ABNORMAL LOW (ref 6.5–8.1)

## 2022-01-13 LAB — CBC
HCT: 30.4 % — ABNORMAL LOW (ref 39.0–52.0)
Hemoglobin: 9.9 g/dL — ABNORMAL LOW (ref 13.0–17.0)
MCH: 33 pg (ref 26.0–34.0)
MCHC: 32.6 g/dL (ref 30.0–36.0)
MCV: 101.3 fL — ABNORMAL HIGH (ref 80.0–100.0)
Platelets: 222 10*3/uL (ref 150–400)
RBC: 3 MIL/uL — ABNORMAL LOW (ref 4.22–5.81)
RDW: 13.6 % (ref 11.5–15.5)
WBC: 8.7 10*3/uL (ref 4.0–10.5)
nRBC: 0 % (ref 0.0–0.2)

## 2022-01-13 LAB — MRSA NEXT GEN BY PCR, NASAL: MRSA by PCR Next Gen: NOT DETECTED

## 2022-01-13 MED ORDER — SODIUM CHLORIDE 0.9 % IV SOLN
2.0000 g | Freq: Two times a day (BID) | INTRAVENOUS | Status: DC
  Administered 2022-01-13 – 2022-01-16 (×7): 2 g via INTRAVENOUS
  Filled 2022-01-13 (×7): qty 12.5

## 2022-01-13 MED ORDER — METHYLPREDNISOLONE SODIUM SUCC 40 MG IJ SOLR
40.0000 mg | Freq: Two times a day (BID) | INTRAMUSCULAR | Status: DC
  Administered 2022-01-13 – 2022-01-17 (×8): 40 mg via INTRAVENOUS
  Filled 2022-01-13 (×8): qty 1

## 2022-01-13 NOTE — Progress Notes (Signed)
Pharmacy Antibiotic Note  Thomas Doyle is a 88 y.o. male admitted on 01/05/2022 with pneumonia.  Pharmacy has been consulted for cefepime dosing.  Plan: Cefepime 2 g IV every 12 hours Monitor clinical progress, renal function F/U C&S, abx deescalation / LOT   Height: '5\' 10"'$  (177.8 cm) Weight: 66.9 kg (147 lb 7.8 oz) IBW/kg (Calculated) : 73  Temp (24hrs), Avg:98.8 F (37.1 C), Min:98.1 F (36.7 C), Max:99.8 F (37.7 C)  Recent Labs  Lab 01/09/22 1146 01/09/22 1147 01/15/2022 1916 01/18/2022 1917 01/11/2022 2100 01/11/22 0421 01/12/22 0424 01/13/22 0419  WBC 6.2  --  6.2  --   --  6.4 9.5 8.7  CREATININE 1.15  --  1.11  --   --  1.17 1.14 1.03  LATICACIDVEN  --  1.5  --  1.1 1.2  --   --   --     Estimated Creatinine Clearance: 45.1 mL/min (by C-G formula based on SCr of 1.03 mg/dL).    Allergies  Allergen Reactions   Codeine Nausea Only    Antimicrobials this admission: 6/19 azithromycin >>  6/19 Rocephin >> 6/22 6/22 cefepime >>  Microbiology results: 6/19 BCx: NGTD 6/22 MRSA PCR: needs to be collected  Thank you for allowing pharmacy to be a part of this patient's care.  Suzzanne Cloud, PharmD, BCPS 01/13/2022 5:03 PM

## 2022-01-13 NOTE — Progress Notes (Signed)
Palliative:  HPI: 88 y.o. male  with past medical history of large B-cel lymphoma stage 4 (not on active treatment), NHL, paroxysmal atrial fibrillation, COPD, HTN admitted on 01/19/2022 with worsening shortness of breath at rest and with exertion with increased swelling of legs over past months. Admitted with acute respiratory failure related to pneumonia/COPD vs CHF. MOST reviewed. Noted patient stated interest in pursuing hospice support.    I met today with Thomas Doyle but no visitors/family at bedside. He appears more fatigued and short of breath today as compared to my visit yesterday. He also has more trouble talking with increased coughing when attempting to speak. Thatcher reports that he does not feel any better today.  He reports feeling tired. He makes jokes about "pulling the plug." When I asked him how he feels seriously he does tell me that he knows he cannot have but so much time left and he does feel like his quality of life is declining. He talks of wanting to give this more time for his daughters - he shares that he knows that they want him around a little longer. He expresses peace for when his time comes. We discussed allowing more time for medications to work but also with knowledge that his body may not be as strong as it used to be to utilize the medications and antibiotics to fight this infection. I also worry about the part of his lungs noted in his abd CT do not look good. I encouraged Lincoln to continue to be honest with his feelings and his wishes and that we will help him and his family through whatever comes next. At this time he wishes to continue to give more time for treatment to see if he can have improvement. He would like to return home ultimately.   All questions/concerns addressed. Emotional support provided. I will follow up again tomorrow.   Exam: Alert, oriented. Fatigued. Short of breath. 15L nasal cannula. Abd soft. Moves all extremities.   Plan: - DNR in place.  -  Watchful waiting - continue medical treatment with hopes of some level of improvement with goal to return home with hospice.  - I will follow up tomorrow.   [place], NP Palliative Medicine Team Pager [phone] (Please see [email or web address] for schedule) Team Phone [phone]    Greater than 50%  of this time was spent counseling and coordinating care related to the above assessment and plan

## 2022-01-13 NOTE — Progress Notes (Signed)
PROGRESS NOTE    VERN GUERETTE  JME:[id number] DOB: [birth year] DOA: 01/07/2022 PCP: Leeroy Cha, MD    Brief Narrative:  HPI per Dr. Ileene Musa on 01/09/2022   JANICE SEALES is a 88 y.o. male with medical history significant of large B-cell lymphoma stage 4 not on treatment following palliative care, non-hodgkin's lymphoma, paroxysmal A.fib (not on anticoagulation), COPD, HTN who presents with worsening cough and shortness of breath.   Symptoms have been ongoing for the past 2 weeks.  Reports severe cough productive of brown thick sputum.  Has increasing shortness of breath both at rest and with exertion.  No fever.  No chest pain.  Has noticed some increased swelling of the legs for the past 3 to 5 months.  Also has noticed decrease sensation to the right lower extremity from the mid pretibial region down to his feet for the past 3 months.  Today felt like he was "dying."  He is on palliative care outpatient and now expressed wishes to be on hospice following discharge.   He was evaluated in the ED yesterday with chest x-ray showing possible pulmonary interstitial edema.  He was discharged on Augmentin.     In the ED he was afebrile, normotensive but tachypneic initially hypoxic down to 66% on room air requiring 10 L via nonrebreather.  Has been weaned down to 6 L via nasal cannula. Chest x-ray shows moderate to severe bilateral infiltrate with small layering bilateral effusion.  COVID PCR is negative.  BNP of 294.   No leukocytosis, hemoglobin 11.2.  BMP unremarkable.  Mildly elevated total bilirubin 1.6.   He was started on IV azithromycin and Rocephin in the ED.   Assessment & Plan:   Principal Problem:   Acute respiratory failure with hypoxia (HCC) Active Problems:   Normocytic anemia   Essential hypertension   Large B-cell lymphoma (HCC)   Paroxysmal atrial fibrillation (HCC)   Paresthesia   Acute respiratory failure with hypoxia -Felt to be secondary to pneumonia   -Reports that he has had breathing issues since having COVID in this past December -Check CT chest -Currently has increasing oxygen requirement -Continue antibiotics  -We will add steroids -Continue bronchodilators -Continue to wean down oxygen as tolerated -Currently on high flow nasal cannula at 15 L  Community-acquired pneumonia -Currently on ceftriaxone and azithromycin -Continue bronchodilators -MRSA PCR is negative -Since has not had any significant improvement and is still on high flow nasal cannula, will change ceftriaxone for cefepime  Acute diastolic congestive heart failure -Echo with normal ejection fraction grade 2 diastolic dysfunction - Mildly elevated BNP -He did receive some Lasix and has not had significant urine output -Clinically, he does not appear to be overtly volume overloaded -Hold off on further Lasix for now  Nausea, vomiting -Abdominal x-ray unrevealing -CT abdomen without any obvious cause of symptoms  Paroxysmal atrial fibrillation -Currently on amiodarone -Not on chronic anticoagulation due to patient wishes  Large B-cell lymphoma -Currently, he is not followed by oncology -Followed by palliative care as an outpatient  Paresthesias -Venous Dopplers negative for DVT -Suspect that he has 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  Their desire is for patient to improve enough to discharge home with hospice services.  DVT prophylaxis: enoxaparin (LOVENOX) injection 40 mg Start: 01/11/22 1000  Code Status: DNR Family Communication: No family present Disposition Plan: Status is: Inpatient Remains  inpatient appropriate because: Continued hypoxia/worsening oxygen requirement     Consultants:  Palliative care  Procedures:    Antimicrobials:  Ceftriaxone 6/20> 6/22 Cefepime 6/22.  Azithromycin 6/20>   Subjective: He says he does not feel any better today.  Still short of breath.  Still on high flow nasal cannula at 15 L.  He has a productive cough with brownish colored sputum  Objective: Vitals:   01/13/22 0728 01/13/22 0729 01/13/22 1321 01/13/22 1446  BP:    (!) 99/46  Pulse:    71  Resp: 18   20  Temp:    99.8 F (37.7 C)  TempSrc:    Oral  SpO2: 90% 90% 90% (!) 78%  Weight:      Height:        Intake/Output Summary (Last 24 hours) at 01/13/2022 1942 Last data filed at 01/13/2022 1900 Gross per 24 hour  Intake 1418.38 ml  Output 950 ml  Net 468.38 ml   Filed Weights   01/15/2022 1905 01/11/22 2054  Weight: 65.8 kg 66.9 kg    Examination:  General exam: Appears calm and comfortable  Respiratory system: Coarse breath sounds at bases. Respiratory effort normal. Cardiovascular system: S1 & S2 heard, RRR. No JVD, murmurs, rubs, gallops or clicks. No pedal edema. Gastrointestinal system: Abdomen is nondistended, soft and nontender. No organomegaly or masses felt. Normal bowel sounds heard. Central nervous system: Alert and oriented. No focal neurological deficits. Extremities: Symmetric 5 x 5 power. Skin: No rashes, lesions or ulcers Psychiatry: Judgement and insight appear normal. Mood & affect appropriate.     Data Reviewed: I have personally reviewed following labs and imaging studies  CBC: Recent Labs  Lab 01/09/22 1146 12/31/2021 1916 01/11/22 0421 01/12/22 0424 01/13/22 0419  WBC 6.2 6.2 6.4 9.5 8.7  NEUTROABS 4.8 5.1  --  8.0*  --   HGB 11.6* 11.2* 9.8* 9.9* 9.9*  HCT 34.7* 34.0* 29.5* 30.2* 30.4*  MCV 99.4 100.0 99.7 100.0 101.3*  PLT 202 207 192 229 993   Basic Metabolic Panel: Recent Labs  Lab 01/09/22 1146 01/14/2022 1916  01/11/22 0421 01/12/22 0424 01/13/22 0419  NA 135 134* 136 137 132*  K 4.3 3.9 3.8 4.0 4.1  CL 103 105 105 104 99  CO2 24 21* '23 26 26  '$ GLUCOSE 110* 118* 131* 116* 94  BUN '15 22 19 '$ 27* 23  CREATININE 1.15 1.11 1.17 1.14 1.03  CALCIUM 8.5* 8.0* 7.8* 8.0* 7.8*  MG  --   --   --  2.1  --   PHOS  --   --   --  3.4  --    GFR: Estimated Creatinine Clearance: 45.1 mL/min (by C-G formula based on SCr of 1.03 mg/dL). Liver Function Tests: Recent Labs  Lab 01/09/22 1146 01/15/2022 1916 01/11/22 0421 01/12/22 0424 01/13/22 0419  AST 54* 48* 45* 43* 38  ALT 47* 41 39 40 39  ALKPHOS 108 100 90 83 78  BILITOT 1.4* 1.6* 1.1 0.7 0.8  PROT 6.4* 5.9* 5.3* 5.2* 4.9*  ALBUMIN 3.6 3.2* 2.7* 2.6* 2.4*   No results for input(s): "LIPASE", "AMYLASE" in the last 168 hours. No results for input(s): "AMMONIA" in the last 168 hours. Coagulation Profile: No results for input(s): "INR", "PROTIME" in the last 168 hours. Cardiac Enzymes: No results for input(s): "CKTOTAL", "CKMB", "CKMBINDEX", "  TROPONINI" in the last 168 hours. BNP (last 3 results) No results for input(s): "PROBNP" in the last 8760 hours. HbA1C: No results for input(s): "HGBA1C" in the last 72 hours. CBG: No results for input(s): "GLUCAP" in the last 168 hours. Lipid Profile: No results for input(s): "CHOL", "HDL", "LDLCALC", "TRIG", "CHOLHDL", "LDLDIRECT" in the last 72 hours. Thyroid Function Tests: No results for input(s): "TSH", "T4TOTAL", "FREET4", "T3FREE", "THYROIDAB" in the last 72 hours. Anemia Panel: No results for input(s): "VITAMINB12", "FOLATE", "FERRITIN", "TIBC", "IRON", "RETICCTPCT" in the last 72 hours. Sepsis Labs: Recent Labs  Lab 01/09/22 1147 01/14/2022 1917 12/30/2021 2100  LATICACIDVEN 1.5 1.1 1.2    Recent Results (from the past 240 hour(s))  Culture, blood (routine x 2)     Status: None (Preliminary result)   Collection Time: 12/31/2021  7:13 PM   Specimen: BLOOD  Result Value Ref Range Status    Specimen Description   Final    BLOOD RIGHT ANTECUBITAL Performed at [facility] [address] [place], [place] [address]    Special Requests   Final    BOTTLES DRAWN AEROBIC AND ANAEROBIC Blood Culture adequate volume Performed at [facility] [address] [place], [place] [address]    Culture   Final    NO GROWTH 3 DAYS Performed at [facility], [place] [address] [place], [place] [address]    Report Status PENDING  Incomplete  Culture, blood (routine x 2)     Status: None (Preliminary result)   Collection Time: 12/30/2021  7:13 PM   Specimen: BLOOD  Result Value Ref Range Status   Specimen Description   Final    BLOOD BLOOD LEFT FOREARM Performed at [facility] [address], [place], [place] [address]    Special Requests   Final    BOTTLES DRAWN AEROBIC AND ANAEROBIC Blood Culture adequate volume Performed at [facility] [address], [place], [place] [address]    Culture   Final    NO GROWTH 3 DAYS Performed at [facility], [place] [address] [place], [place] [address]    Report Status PENDING  Incomplete  Resp Panel by RT-PCR (Flu A&B, Covid) Anterior Nasal Swab     Status: None   Collection Time: 01/02/2022  7:18 PM   Specimen: Anterior Nasal Swab  Result Value Ref Range Status   SARS Coronavirus 2 by RT PCR NEGATIVE NEGATIVE Final    Comment: (NOTE) SARS-CoV-2 target nucleic acids are NOT DETECTED.  The SARS-CoV-2 RNA is generally detectable in upper respiratory specimens during the acute phase of infection. The lowest concentration of SARS-CoV-2 viral copies this assay can detect is 138 copies/mL. A negative result does not preclude SARS-Cov-2 infection and should not be used as the sole basis for treatment or other patient management decisions. A negative result may occur with  improper specimen collection/handling, submission of specimen other than nasopharyngeal  swab, presence of viral mutation(s) within the areas targeted by this assay, and inadequate number of viral copies(<138 copies/mL). A negative result must be combined with clinical observations, patient history, and epidemiological information. The expected result is Negative.  Fact Sheet for Patients:  EntrepreneurPulse.com.au  Fact Sheet for Healthcare Providers:  IncredibleEmployment.be  This test is no t yet approved or cleared by the [place] FDA and  has been authorized for detection and/or diagnosis of SARS-CoV-2 by FDA under an Emergency Use Authorization (EUA). This EUA will remain  in effect (  meaning this test can be used) for the duration of the COVID-19 declaration under Section 564(b)(1) of the Act, 21 U.S.C.section 360bbb-3(b)(1), unless the authorization is terminated  or revoked sooner.       Influenza A by PCR NEGATIVE NEGATIVE Final   Influenza B by PCR NEGATIVE NEGATIVE Final    Comment: (NOTE) The Xpert Xpress SARS-CoV-2/FLU/RSV plus assay is intended as an aid in the diagnosis of influenza from Nasopharyngeal swab specimens and should not be used as a sole basis for treatment. Nasal washings and aspirates are unacceptable for Xpert Xpress SARS-CoV-2/FLU/RSV testing.  Fact Sheet for Patients: EntrepreneurPulse.com.au  Fact Sheet for Healthcare Providers: IncredibleEmployment.be  This test is not yet approved or cleared by the [place] FDA and has been authorized for detection and/or diagnosis of SARS-CoV-2 by FDA under an Emergency Use Authorization (EUA). This EUA will remain in effect (meaning this test can be used) for the duration of the COVID-19 declaration under Section 564(b)(1) of the Act, 21 U.S.C. section 360bbb-3(b)(1), unless the authorization is terminated or revoked.  Performed at [facility], [place] [address] [place], [place] [address]    MRSA Next Gen by PCR, Nasal     Status: None   Collection Time: 01/13/22  5:14 PM   Specimen: Nasal Mucosa; Nasal Swab  Result Value Ref Range Status   MRSA by PCR Next Gen NOT DETECTED NOT DETECTED Final    Comment: (NOTE) The GeneXpert MRSA Assay (FDA approved for NASAL specimens only), is one component of a comprehensive MRSA colonization surveillance program. It is not intended to diagnose MRSA infection nor to guide or monitor treatment for MRSA infections. Test performance is not FDA approved in patients less than 73 years old. Performed at [facility], [place] [address] [place], [place] [address]          Radiology Studies: CT CHEST WO CONTRAST  Result Date: 01/13/2022 CLINICAL DATA:  Respiratory illness. EXAM: CT CHEST WITHOUT CONTRAST TECHNIQUE: Multidetector CT imaging of the chest was performed following the standard protocol without IV contrast. RADIATION DOSE REDUCTION: This exam was performed according to the departmental dose-optimization program which includes automated exposure control, adjustment of the mA and/or kV according to patient size and/or use of iterative reconstruction technique. COMPARISON:  July 21, 2021 FINDINGS: Cardiovascular: There is marked severity calcification of the aortic arch, without evidence of aortic aneurysm. There is mild cardiomegaly with moderate severity coronary artery calcification. No pericardial effusion. Mediastinum/Nodes: Mild pretracheal lymphadenopathy is seen. Thyroid gland, trachea, and esophagus demonstrate no significant findings. Lungs/Pleura: Moderate to marked severity areas of biapical scarring and/or atelectasis are seen. This is noted on the prior study. Marked severity, predominately diffuse infiltrates are seen within the posterior aspects of the right upper lobe and right middle lobe, with entire left upper lobe and bilateral lower lobe involvement. Bronchiectatic changes are also seen throughout  both lungs with mild honeycombing suspected along the posterior and lateral aspects of the right lower lobe. Bilateral small to moderate sized pleural effusions are seen. No pneumothorax is seen. Upper Abdomen: Multiple surgical clips are seen within the gallbladder fossa. Musculoskeletal: Multilevel degenerative changes seen throughout the thoracic spine. IMPRESSION: 1. Marked severity, predominately diffuse bilateral infiltrates, as described above. 2. Bilateral small to moderate sized pleural effusions. 3. Moderate to marked severity biapical scarring and/or atelectasis. 4. Mild cardiomegaly with moderate severity coronary artery calcification. Aortic Atherosclerosis (ICD10-I70.0). Electronically Signed   By: Virgina Norfolk M.D.   On: 01/13/2022 18:18  CT ABDOMEN PELVIS W CONTRAST  Result Date: 01/12/2022 CLINICAL DATA:  Nausea and vomiting. EXAM: CT ABDOMEN AND PELVIS WITH CONTRAST TECHNIQUE: Multidetector CT imaging of the abdomen and pelvis was performed using the standard protocol following bolus administration of intravenous contrast. RADIATION DOSE REDUCTION: This exam was performed according to the departmental dose-optimization program which includes automated exposure control, adjustment of the mA and/or kV according to patient size and/or use of iterative reconstruction technique. CONTRAST:  157m OMNIPAQUE IOHEXOL 300 MG/ML  SOLN COMPARISON:  05/04/2020. FINDINGS: Lower chest: The heart is borderline enlarged and coronary artery calcifications are noted. There is a trace pericardial effusion. There are small bilateral pleural effusions. Ground-glass opacities, bronchiectasis, interlobular septal thickening and honeycombing is present at the lung bases, possible fibrotic changes. Patchy infiltrates are noted in the lower lobes bilaterally possible edema or infection. Hepatobiliary: No focal liver abnormality. Intrahepatic and extrahepatic biliary ductal dilatation is unchanged. The common bile  duct measures 2.1 cm. The gallbladder is surgically absent. Pancreas: Unremarkable. No pancreatic ductal dilatation or surrounding inflammatory changes. Spleen: Normal in size without focal abnormality. Adrenals/Urinary Tract: No adrenal nodule or mass. The kidneys enhance symmetrically. No renal calculus or hydronephrosis. The bladder is within normal limits. Stomach/Bowel: There is a small hiatal hernia. Contrast is noted in the distal esophagus suggesting reflux. The stomach is unremarkable. No bowel obstruction, free air, or pneumatosis. Multiple scattered diverticula are present along the colon without evidence of diverticulitis. The appendix is not visualized on exam. Vascular/Lymphatic: Aortic atherosclerosis with ectasia of the distal abdominal aorta. No abdominal or pelvic lymphadenopathy. Reproductive: Prostate is unremarkable. Other: No free fluid. Fat containing inguinal hernias are noted bilaterally. Musculoskeletal: A few foci of air are noted in the subcutaneous fat in the right lower quadrant which may be iatrogenic. Degenerative changes are present in the thoracolumbar spine. No acute or suspicious osseous abnormality. IMPRESSION: 1. No bowel obstruction or free air. 2. Diverticulosis without diverticulitis. 3. Ground-glass opacities with interlobular septal thickening, bronchiectasis, and mild honeycombing may be associated with pulmonary fibrosis. Patchy infiltrates are also noted at the lung bases possible superimposed edema or infiltrate. High-resolution CT is suggested for further evaluation on follow-up after resolution of acute abnormalities. 4. Small bilateral pleural effusions. 5. Aortic atherosclerosis. Electronically Signed   By: LBrett FairyM.D.   On: 01/12/2022 23:10   DG Abd Portable 1V  Result Date: 01/12/2022 CLINICAL DATA:  Nausea and vomiting. EXAM: PORTABLE ABDOMEN - 1 VIEW COMPARISON:  CT abdomen May 04, 2020 FINDINGS: The bowel gas pattern is normal. Cholecystectomy  clips. Levoconvex curvature of the thoracolumbar spine with associated degenerative change. Pelvic phleboliths. IMPRESSION: No evidence of bowel obstruction. Electronically Signed   By: JDahlia BailiffM.D.   On: 01/12/2022 11:54   DG CHEST PORT 1 VIEW  Result Date: 01/12/2022 CLINICAL DATA:  Shortness of breath EXAM: PORTABLE CHEST 1 VIEW COMPARISON:  12/30/2021 FINDINGS: Persistent bilateral opacities. Worsening aeration in the upper lungs. Probable small pleural effusions. Stable cardiomediastinal contours. IMPRESSION: Worsening lung aeration. Probable small pleural effusions as before. Electronically Signed   By: PMacy MisM.D.   On: 01/12/2022 08:25        Scheduled Meds:  ALPRAZolam  0.5 mg Oral QHS   amiodarone  300 mg Oral Daily   amLODipine  10 mg Oral Daily   aspirin EC  81 mg Oral Daily   budesonide (PULMICORT) nebulizer solution  0.25 mg Nebulization BID   chlorpheniramine-HYDROcodone  5 mL Oral Q12H   cholecalciferol  3,000 Units Oral Daily   enoxaparin (LOVENOX) injection  40 mg Subcutaneous Q24H   ipratropium-albuterol  3 mL Nebulization Q6H   methylPREDNISolone (SOLU-MEDROL) injection  40 mg Intravenous Q12H   omega-3 acid ethyl esters  1 g Oral Daily   rOPINIRole  1 mg Oral QHS   vitamin B-12  2,500 mcg Oral Daily   Continuous Infusions:  azithromycin 500 mg (01/12/22 2246)   ceFEPime (MAXIPIME) IV 2 g (01/13/22 1713)     LOS: 3 days    Time spent: 38mns    JKathie Dike MD [facility]   If 7PM-7AM, please contact night-coverage [email or web address]  01/13/2022, 7:42 PM

## 2022-01-14 DIAGNOSIS — Z7189 Other specified counseling: Secondary | ICD-10-CM | POA: Diagnosis not present

## 2022-01-14 DIAGNOSIS — Z515 Encounter for palliative care: Secondary | ICD-10-CM | POA: Diagnosis not present

## 2022-01-14 DIAGNOSIS — J9601 Acute respiratory failure with hypoxia: Secondary | ICD-10-CM | POA: Diagnosis not present

## 2022-01-14 LAB — BASIC METABOLIC PANEL
Anion gap: 6 (ref 5–15)
BUN: 20 mg/dL (ref 8–23)
CO2: 27 mmol/L (ref 22–32)
Calcium: 8.3 mg/dL — ABNORMAL LOW (ref 8.9–10.3)
Chloride: 102 mmol/L (ref 98–111)
Creatinine, Ser: 1.05 mg/dL (ref 0.61–1.24)
GFR, Estimated: >60 mL/min (ref >60)
Glucose, Bld: 145 mg/dL — ABNORMAL HIGH (ref 70–99)
Potassium: 4.2 mmol/L (ref 3.5–5.1)
Sodium: 135 mmol/L (ref 135–145)

## 2022-01-14 NOTE — TOC Progression Note (Signed)
Transition of Care [facility]) - Progression Note    Patient Details  Name: Thomas Doyle MRN: [id number] Date of Birth: [birth year]  Transition of [facility] CM/SW Contact  Geni Bers, RN Phone Number: 01/14/2022, 10:16 AM  Clinical Narrative:     Pt continues to be on HF O2. No ready for discharge.   Expected Discharge Plan: Home w Hospice Care Barriers to Discharge: No Barriers Identified  Expected Discharge Plan and Services Expected Discharge Plan: Home w Hospice Care     Post Acute Care Choice: Hospice, Home Health Living arrangements for the past 2 months: Single Family Home                                       Social Determinants of Health (SDOH) Interventions    Readmission Risk Interventions     No data to display

## 2022-01-14 NOTE — Progress Notes (Signed)
PHARMACY NOTE -  Cefepime  Pharmacy has been assisting with dosing of cefepime for CAP. Dosage remains stable at 2g IV q12 hr and further renal adjustments per institutional Pharmacy antibiotic protocol  Pharmacy will sign off, following peripherally for culture results or dose adjustments. Please reconsult if a change in clinical status warrants re-evaluation of dosage.  Bernadene Person, PharmD, BCPS [phone] 01/14/2022, 12:13 PM

## 2022-01-15 DIAGNOSIS — I10 Essential (primary) hypertension: Secondary | ICD-10-CM | POA: Diagnosis not present

## 2022-01-15 DIAGNOSIS — C8510 Unspecified B-cell lymphoma, unspecified site: Secondary | ICD-10-CM | POA: Diagnosis not present

## 2022-01-15 DIAGNOSIS — J9601 Acute respiratory failure with hypoxia: Secondary | ICD-10-CM | POA: Diagnosis not present

## 2022-01-15 DIAGNOSIS — Z7189 Other specified counseling: Secondary | ICD-10-CM | POA: Diagnosis not present

## 2022-01-15 DIAGNOSIS — D649 Anemia, unspecified: Secondary | ICD-10-CM | POA: Diagnosis not present

## 2022-01-15 DIAGNOSIS — Z515 Encounter for palliative care: Secondary | ICD-10-CM | POA: Diagnosis not present

## 2022-01-15 LAB — CULTURE, BLOOD (ROUTINE X 2)
Culture: NO GROWTH
Culture: NO GROWTH
Special Requests: ADEQUATE
Special Requests: ADEQUATE

## 2022-01-16 DIAGNOSIS — I10 Essential (primary) hypertension: Secondary | ICD-10-CM | POA: Diagnosis not present

## 2022-01-16 DIAGNOSIS — Z66 Do not resuscitate: Secondary | ICD-10-CM | POA: Diagnosis present

## 2022-01-16 DIAGNOSIS — D649 Anemia, unspecified: Secondary | ICD-10-CM | POA: Diagnosis not present

## 2022-01-16 DIAGNOSIS — C8510 Unspecified B-cell lymphoma, unspecified site: Secondary | ICD-10-CM | POA: Diagnosis not present

## 2022-01-16 DIAGNOSIS — J9601 Acute respiratory failure with hypoxia: Secondary | ICD-10-CM | POA: Diagnosis not present

## 2022-01-16 LAB — CBC
HCT: 33.5 % — ABNORMAL LOW (ref 39.0–52.0)
Hemoglobin: 11 g/dL — ABNORMAL LOW (ref 13.0–17.0)
MCH: 32.8 pg (ref 26.0–34.0)
MCHC: 32.8 g/dL (ref 30.0–36.0)
MCV: 100 fL (ref 80.0–100.0)
Platelets: 310 10*3/uL (ref 150–400)
RBC: 3.35 MIL/uL — ABNORMAL LOW (ref 4.22–5.81)
RDW: 13.5 % (ref 11.5–15.5)
WBC: 15.2 10*3/uL — ABNORMAL HIGH (ref 4.0–10.5)
nRBC: 0 % (ref 0.0–0.2)

## 2022-01-16 LAB — COMPREHENSIVE METABOLIC PANEL
ALT: 64 U/L — ABNORMAL HIGH (ref 0–44)
AST: 56 U/L — ABNORMAL HIGH (ref 15–41)
Albumin: 2.8 g/dL — ABNORMAL LOW (ref 3.5–5.0)
Alkaline Phosphatase: 114 U/L (ref 38–126)
Anion gap: 7 (ref 5–15)
BUN: 37 mg/dL — ABNORMAL HIGH (ref 8–23)
CO2: 26 mmol/L (ref 22–32)
Calcium: 8.6 mg/dL — ABNORMAL LOW (ref 8.9–10.3)
Chloride: 107 mmol/L (ref 98–111)
Creatinine, Ser: 1.04 mg/dL (ref 0.61–1.24)
GFR, Estimated: >60 mL/min (ref >60)
Glucose, Bld: 137 mg/dL — ABNORMAL HIGH (ref 70–99)
Potassium: 4.5 mmol/L (ref 3.5–5.1)
Sodium: 140 mmol/L (ref 135–145)
Total Bilirubin: 1.1 mg/dL (ref 0.3–1.2)
Total Protein: 5.3 g/dL — ABNORMAL LOW (ref 6.5–8.1)

## 2022-01-16 LAB — GLUCOSE, CAPILLARY: Glucose-Capillary: 143 mg/dL — ABNORMAL HIGH (ref 70–99)

## 2022-01-16 LAB — BRAIN NATRIURETIC PEPTIDE: B Natriuretic Peptide: 252.2 pg/mL — ABNORMAL HIGH (ref 0.0–100.0)

## 2022-01-16 MED ORDER — FUROSEMIDE 10 MG/ML IJ SOLN
40.0000 mg | Freq: Once | INTRAMUSCULAR | Status: AC
  Administered 2022-01-16: 40 mg via INTRAVENOUS
  Filled 2022-01-16: qty 4

## 2022-01-16 NOTE — Progress Notes (Signed)
Patient voided over 1000 mls after receiving Lasix, oxygen saturation remains in the 90's on 30 liters.

## 2022-01-16 NOTE — Progress Notes (Signed)
PT is not a candidate for Flutter device due to 02 demand and SOB.

## 2022-01-17 DIAGNOSIS — I5031 Acute diastolic (congestive) heart failure: Secondary | ICD-10-CM

## 2022-01-17 DIAGNOSIS — Z515 Encounter for palliative care: Secondary | ICD-10-CM

## 2022-01-17 LAB — COMPREHENSIVE METABOLIC PANEL
ALT: 74 U/L — ABNORMAL HIGH (ref 0–44)
AST: 74 U/L — ABNORMAL HIGH (ref 15–41)
Albumin: 2.7 g/dL — ABNORMAL LOW (ref 3.5–5.0)
Alkaline Phosphatase: 142 U/L — ABNORMAL HIGH (ref 38–126)
Anion gap: 9 (ref 5–15)
BUN: 44 mg/dL — ABNORMAL HIGH (ref 8–23)
CO2: 27 mmol/L (ref 22–32)
Calcium: 8.3 mg/dL — ABNORMAL LOW (ref 8.9–10.3)
Chloride: 103 mmol/L (ref 98–111)
Creatinine, Ser: 1.18 mg/dL (ref 0.61–1.24)
GFR, Estimated: 59 mL/min — ABNORMAL LOW (ref >60)
Glucose, Bld: 131 mg/dL — ABNORMAL HIGH (ref 70–99)
Potassium: 4.5 mmol/L (ref 3.5–5.1)
Sodium: 139 mmol/L (ref 135–145)
Total Bilirubin: 0.9 mg/dL (ref 0.3–1.2)
Total Protein: 5.3 g/dL — ABNORMAL LOW (ref 6.5–8.1)

## 2022-01-17 MED ORDER — ONDANSETRON HCL 4 MG/2ML IJ SOLN: 4.0000 mg | Freq: Four times a day (QID) | INTRAMUSCULAR | Status: DC | PRN

## 2022-01-17 MED ORDER — GLYCOPYRROLATE 1 MG PO TABS: 1.0000 mg | ORAL_TABLET | ORAL | Status: DC | PRN

## 2022-01-17 MED ORDER — HALOPERIDOL 0.5 MG PO TABS: 0.5000 mg | ORAL_TABLET | ORAL | Status: DC | PRN

## 2022-01-17 MED ORDER — MORPHINE BOLUS VIA INFUSION: 2.0000 mg | INTRAVENOUS | Status: DC | PRN

## 2022-01-17 MED ORDER — MORPHINE 100MG IN NS 100ML (1MG/ML) PREMIX INFUSION
2.0000 mg/h | INTRAVENOUS | Status: DC
  Filled 2022-01-17: qty 100

## 2022-01-17 MED ORDER — LORAZEPAM 1 MG PO TABS: 1.0000 mg | ORAL_TABLET | ORAL | Status: DC | PRN

## 2022-01-17 MED ORDER — BIOTENE DRY MOUTH MT LIQD: 15.0000 mL | OROMUCOSAL | Status: DC | PRN

## 2022-01-17 MED ORDER — ACETAMINOPHEN 325 MG PO TABS: 650.0000 mg | ORAL_TABLET | Freq: Four times a day (QID) | ORAL | Status: DC | PRN

## 2022-01-17 MED ORDER — GLYCOPYRROLATE 0.2 MG/ML IJ SOLN: 0.2000 mg | INTRAMUSCULAR | Status: DC | PRN

## 2022-01-17 MED ORDER — ACETAMINOPHEN 650 MG RE SUPP: 650.0000 mg | Freq: Four times a day (QID) | RECTAL | Status: DC | PRN

## 2022-01-17 MED ORDER — ONDANSETRON 4 MG PO TBDP: 4.0000 mg | ORAL_TABLET | Freq: Four times a day (QID) | ORAL | Status: DC | PRN

## 2022-01-17 MED ORDER — HALOPERIDOL LACTATE 5 MG/ML IJ SOLN: 0.5000 mg | INTRAMUSCULAR | Status: DC | PRN

## 2022-01-17 MED ORDER — LORAZEPAM 2 MG/ML IJ SOLN: 1.0000 mg | INTRAMUSCULAR | Status: DC | PRN

## 2022-01-17 MED ORDER — LORAZEPAM 2 MG/ML IJ SOLN: 0.5000 mg | Freq: Four times a day (QID) | INTRAMUSCULAR | Status: DC | PRN

## 2022-01-17 MED ORDER — POLYVINYL ALCOHOL 1.4 % OP SOLN: 1.0000 [drp] | Freq: Four times a day (QID) | OPHTHALMIC | Status: DC | PRN

## 2022-01-17 MED ORDER — LORAZEPAM 2 MG/ML PO CONC: 1.0000 mg | ORAL | Status: DC | PRN

## 2022-01-17 MED ORDER — HALOPERIDOL LACTATE 2 MG/ML PO CONC: 0.5000 mg | ORAL | Status: DC | PRN

## 2022-01-22 NOTE — Progress Notes (Signed)
Pt refusing non-rebreather oxygen supplement, still on high flow nasal canula at 32 .Gave 1 mL morphine for comfort per order. Family present; stated are comfortable with pt not wearing non-rebreather.  Notified the RT and doctor and CN.  Will continue monitor.

## 2022-01-22 NOTE — Care Management Important Message (Signed)
Important Message  Patient Details IM Letter given to the Patient. Name: Thomas Doyle MRN: [id number] Date of Birth: 12/22/1928   Medicare Important Message Given:  Yes     Caren Macadam 01/01/2022, 11:17 AM

## 2022-01-22 NOTE — Death Summary Note (Addendum)
DEATH SUMMARY   Patient Details  Name: Thomas Doyle MRN: [id number] DOB: August 28, 1928 GGY:IRSWNIOEVOJ, Ronie Spies, MD Admission/Discharge Information   Admit Date:  01-13-22  Date of Death: Date of Death: 01-20-22  Time of Death: Time of Death: Oct 10, 1126  Length of Stay: 7   Principle Cause of death: Acute respiratory failure with hypoxia  Hospital Diagnoses: Principal Problem:   Acute respiratory failure with hypoxia ([place]) Active Problems:   Normocytic anemia   Essential hypertension   Large B-cell lymphoma ([place])   Paroxysmal atrial fibrillation ([place])   Community acquired pneumonia   Paresthesia   DNR (do not resuscitate)   Acute diastolic CHF (congestive heart failure) ([place])   Palliative care by specialist Peripheral neuropathy, likely related to previous chemotherapy  Hospital Course: 88 year old male with a history of paroxysmal atrial fibrillation, hypertension, remote history of B-cell lymphoma, admitted to the hospital with shortness of breath.  Patient reported that he had COVID in December 2022.  Since that time he has had some degree of shortness of breath.  His symptoms have been getting worse over the past 2 months.  He was admitted to the hospital with what appeared to be bilateral pulmonary infiltrates and concern for pneumonia.  There was also some concern for underlying CHF.  He was treated with antibiotics and IV Lasix.  He did not make significant improvements and required increasing amount of oxygen.  He eventually did require nonrebreather and subsequently heated high flow nasal cannula.  CT scan was performed that showed bilateral pulmonary infiltrates.  He was started on intravenous steroids.  He is chronically on amiodarone which could may also have led to a pneumonitis.  This was discontinued.  He was seen by palliative care and made it very clear that he did not want to be resuscitated and would want to focus on comfort if his condition was not improving.   Unfortunately, despite aggressive measures, his overall symptoms progressed and his condition declined.  After discussing with patient's family, he was transition to full comfort care.  He passed away in the hospital.          Procedures:   Consultations:   The results of significant diagnostics from this hospitalization (including imaging, microbiology, ancillary and laboratory) are listed below for reference.   Significant Diagnostic Studies: CT CHEST WO CONTRAST  Result Date: 01/13/2022 CLINICAL DATA:  Respiratory illness. EXAM: CT CHEST WITHOUT CONTRAST TECHNIQUE: Multidetector CT imaging of the chest was performed following the standard protocol without IV contrast. RADIATION DOSE REDUCTION: This exam was performed according to the departmental dose-optimization program which includes automated exposure control, adjustment of the mA and/or kV according to patient size and/or use of iterative reconstruction technique. COMPARISON:  July 21, 2021 FINDINGS: Cardiovascular: There is marked severity calcification of the aortic arch, without evidence of aortic aneurysm. There is mild cardiomegaly with moderate severity coronary artery calcification. No pericardial effusion. Mediastinum/Nodes: Mild pretracheal lymphadenopathy is seen. Thyroid gland, trachea, and esophagus demonstrate no significant findings. Lungs/Pleura: Moderate to marked severity areas of biapical scarring and/or atelectasis are seen. This is noted on the prior study. Marked severity, predominately diffuse infiltrates are seen within the posterior aspects of the right upper lobe and right middle lobe, with entire left upper lobe and bilateral lower lobe involvement. Bronchiectatic changes are also seen throughout both lungs with mild honeycombing suspected along the posterior and lateral aspects of the right lower lobe. Bilateral small to moderate sized pleural effusions are seen. No pneumothorax is seen. Upper  Abdomen:  Multiple surgical clips are seen within the gallbladder fossa. Musculoskeletal: Multilevel degenerative changes seen throughout the thoracic spine. IMPRESSION: 1. Marked severity, predominately diffuse bilateral infiltrates, as described above. 2. Bilateral small to moderate sized pleural effusions. 3. Moderate to marked severity biapical scarring and/or atelectasis. 4. Mild cardiomegaly with moderate severity coronary artery calcification. Aortic Atherosclerosis (ICD10-I70.0). Electronically Signed   By: Virgina Norfolk M.D.   On: 01/13/2022 18:18   CT ABDOMEN PELVIS W CONTRAST  Result Date: 01/12/2022 CLINICAL DATA:  Nausea and vomiting. EXAM: CT ABDOMEN AND PELVIS WITH CONTRAST TECHNIQUE: Multidetector CT imaging of the abdomen and pelvis was performed using the standard protocol following bolus administration of intravenous contrast. RADIATION DOSE REDUCTION: This exam was performed according to the departmental dose-optimization program which includes automated exposure control, adjustment of the mA and/or kV according to patient size and/or use of iterative reconstruction technique. CONTRAST:  153m OMNIPAQUE IOHEXOL 300 MG/ML  SOLN COMPARISON:  05/04/2020. FINDINGS: Lower chest: The heart is borderline enlarged and coronary artery calcifications are noted. There is a trace pericardial effusion. There are small bilateral pleural effusions. Ground-glass opacities, bronchiectasis, interlobular septal thickening and honeycombing is present at the lung bases, possible fibrotic changes. Patchy infiltrates are noted in the lower lobes bilaterally possible edema or infection. Hepatobiliary: No focal liver abnormality. Intrahepatic and extrahepatic biliary ductal dilatation is unchanged. The common bile duct measures 2.1 cm. The gallbladder is surgically absent. Pancreas: Unremarkable. No pancreatic ductal dilatation or surrounding inflammatory changes. Spleen: Normal in size without focal abnormality.  Adrenals/Urinary Tract: No adrenal nodule or mass. The kidneys enhance symmetrically. No renal calculus or hydronephrosis. The bladder is within normal limits. Stomach/Bowel: There is a small hiatal hernia. Contrast is noted in the distal esophagus suggesting reflux. The stomach is unremarkable. No bowel obstruction, free air, or pneumatosis. Multiple scattered diverticula are present along the colon without evidence of diverticulitis. The appendix is not visualized on exam. Vascular/Lymphatic: Aortic atherosclerosis with ectasia of the distal abdominal aorta. No abdominal or pelvic lymphadenopathy. Reproductive: Prostate is unremarkable. Other: No free fluid. Fat containing inguinal hernias are noted bilaterally. Musculoskeletal: A few foci of air are noted in the subcutaneous fat in the right lower quadrant which may be iatrogenic. Degenerative changes are present in the thoracolumbar spine. No acute or suspicious osseous abnormality. IMPRESSION: 1. No bowel obstruction or free air. 2. Diverticulosis without diverticulitis. 3. Ground-glass opacities with interlobular septal thickening, bronchiectasis, and mild honeycombing may be associated with pulmonary fibrosis. Patchy infiltrates are also noted at the lung bases possible superimposed edema or infiltrate. High-resolution CT is suggested for further evaluation on follow-up after resolution of acute abnormalities. 4. Small bilateral pleural effusions. 5. Aortic atherosclerosis. Electronically Signed   By: LBrett FairyM.D.   On: 01/12/2022 23:10   DG Abd Portable 1V  Result Date: 01/12/2022 CLINICAL DATA:  Nausea and vomiting. EXAM: PORTABLE ABDOMEN - 1 VIEW COMPARISON:  CT abdomen May 04, 2020 FINDINGS: The bowel gas pattern is normal. Cholecystectomy clips. Levoconvex curvature of the thoracolumbar spine with associated degenerative change. Pelvic phleboliths. IMPRESSION: No evidence of bowel obstruction. Electronically Signed   By: JDahlia BailiffM.D.    On: 01/12/2022 11:54   DG CHEST PORT 1 VIEW  Result Date: 01/12/2022 CLINICAL DATA:  Shortness of breath EXAM: PORTABLE CHEST 1 VIEW COMPARISON:  12/29/2021 FINDINGS: Persistent bilateral opacities. Worsening aeration in the upper lungs. Probable small pleural effusions. Stable cardiomediastinal contours. IMPRESSION: Worsening lung aeration. Probable small pleural effusions as before. Electronically  Signed   By: Macy Mis M.D.   On: 01/12/2022 08:25   ECHOCARDIOGRAM COMPLETE  Result Date: 01/11/2022    ECHOCARDIOGRAM REPORT   Patient Name:   IKENNA OHMS Date of Exam: 01/11/2022 Medical Rec #:  [id number]      Height:       70.0 in Accession #:    [id number]     Weight:       145.0 lb Date of Birth:  1929/02/02     BSA:          1.821 m Patient Age:    70 years       BP:           99/48 mmHg Patient Gender: M              HR:           69 bpm. Exam Location:  Inpatient Procedure: 2D Echo, Cardiac Doppler and Color Doppler Indications:    Dyspnea  History:        Patient has prior history of Echocardiogram examinations, most                 recent 04/05/2012. COPD, Arrythmias:Atrial Fibrillation; Risk                 Factors:Hypertension.  Sonographer:    Jefferey Pica Referring Phys: [id number] [place]  1. Left ventricular ejection fraction, by estimation, is 55 to 60%. The left ventricle has normal function. The left ventricle has no regional wall motion abnormalities. Left ventricular diastolic parameters are consistent with Grade II diastolic dysfunction (pseudonormalization).  2. Right ventricular systolic function is normal. The right ventricular size is normal. There is moderately elevated pulmonary artery systolic pressure. The estimated right ventricular systolic pressure is 02.6 mmHg.  3. Left atrial size was mildly dilated.  4. The mitral valve is normal in structure. No evidence of mitral valve regurgitation. No evidence of mitral stenosis. Moderate mitral annular  calcification.  5. Tricuspid valve regurgitation is mild to moderate.  6. The aortic valve is tricuspid. There is mild calcification of the aortic valve. There is mild thickening of the aortic valve. Aortic valve regurgitation is mild. Aortic valve sclerosis is present, with no evidence of aortic valve stenosis. Comparison(s): Unable to access 2013 images. FINDINGS  Left Ventricle: Left ventricular ejection fraction, by estimation, is 55 to 60%. The left ventricle has normal function. The left ventricle has no regional wall motion abnormalities. The left ventricular internal cavity size was normal in size. There is  no left ventricular hypertrophy. Left ventricular diastolic parameters are consistent with Grade II diastolic dysfunction (pseudonormalization). Right Ventricle: The right ventricular size is normal. No increase in right ventricular wall thickness. Right ventricular systolic function is normal. There is moderately elevated pulmonary artery systolic pressure. The tricuspid regurgitant velocity is 3.20 m/s, and with an assumed right atrial pressure of 8 mmHg, the estimated right ventricular systolic pressure is 37.8 mmHg. Left Atrium: Left atrial size was mildly dilated. Right Atrium: Right atrial size was normal in size. Pericardium: There is no evidence of pericardial effusion. Mitral Valve: The mitral valve is normal in structure. Moderate mitral annular calcification. No evidence of mitral valve regurgitation. No evidence of mitral valve stenosis. Tricuspid Valve: The tricuspid valve is normal in structure. Tricuspid valve regurgitation is mild to moderate. No evidence of tricuspid stenosis. Aortic Valve: The aortic valve is tricuspid. There is mild calcification of the aortic valve. There is mild  thickening of the aortic valve. There is mild aortic valve annular calcification. Aortic valve regurgitation is mild. Aortic valve sclerosis is present, with no evidence of aortic valve stenosis. Aortic valve  peak gradient measures 13.8 mmHg. Pulmonic Valve: The pulmonic valve was grossly normal. Pulmonic valve regurgitation is mild. No evidence of pulmonic stenosis. Aorta: The aortic root and ascending aorta are structurally normal, with no evidence of dilitation. IAS/Shunts: No atrial level shunt detected by color flow Doppler.  LEFT VENTRICLE PLAX 2D LVIDd:         5.70 cm   Diastology LVIDs:         4.10 cm   LV e' medial:    7.62 cm/s LV PW:         0.80 cm   LV E/e' medial:  8.9 LV IVS:        0.90 cm   LV e' lateral:   12.90 cm/s LVOT diam:     1.90 cm   LV E/e' lateral: 5.3 LV SV:         92 LV SV Index:   51 LVOT Area:     2.84 cm  RIGHT VENTRICLE             IVC RV S prime:     13.70 cm/s  IVC diam: 2.00 cm TAPSE (M-mode): 2.1 cm LEFT ATRIUM           Index        RIGHT ATRIUM           Index LA diam:      4.60 cm 2.53 cm/m   RA Area:     15.60 cm LA Vol (A2C): 53.2 ml 29.22 ml/m  RA Volume:   40.30 ml  22.13 ml/m LA Vol (A4C): 72.7 ml 39.93 ml/m  AORTIC VALVE                 PULMONIC VALVE AV Area (Vmax): 2.01 cm     PV Vmax:       1.07 m/s AV Vmax:        186.00 cm/s  PV Peak grad:  4.6 mmHg AV Peak Grad:   13.8 mmHg LVOT Vmax:      132.00 cm/s LVOT Vmean:     87.600 cm/s LVOT VTI:       0.326 m  AORTA Ao Root diam: 3.20 cm Ao Asc diam:  3.70 cm MITRAL VALVE               TRICUSPID VALVE MV Area (PHT): 3.77 cm    TR Peak grad:   41.0 mmHg MV Decel Time: 201 msec    TR Vmax:        320.00 cm/s MV E velocity: 67.90 cm/s MV A velocity: 53.70 cm/s  SHUNTS MV E/A ratio:  1.26        Systemic VTI:  0.33 m                            Systemic Diam: 1.90 cm Rudean Haskell MD Electronically signed by Rudean Haskell MD Signature Date/Time: 01/11/2022/5:42:05 PM    Final    VAS [place] LOWER EXTREMITY VENOUS (DVT)  Result Date: 01/11/2022  Lower Venous DVT Study Patient Name:  DALANTE MINUS  Date of Exam:   01/11/2022 Medical Rec #: [id number]       Accession #:    [id number] Date of Birth: 28-Mar-1929  Patient Gender: M Patient Age:   61 years Exam Location:  [facility] Procedure:      VAS [place] LOWER EXTREMITY VENOUS (DVT) Referring Phys: CHING TU --------------------------------------------------------------------------------  Indications: Edema.  Risk Factors: Cancer. Comparison Study: No prior studies. Performing Technologist: Oliver Hum RVT  Examination Guidelines: A complete evaluation includes B-mode imaging, spectral Doppler, color Doppler, and power Doppler as needed of all accessible portions of each vessel. Bilateral testing is considered an integral part of a complete examination. Limited examinations for reoccurring indications may be performed as noted. The reflux portion of the exam is performed with the patient in reverse Trendelenburg.  +---------+---------------+---------+-----------+----------+--------------+ RIGHT    CompressibilityPhasicitySpontaneityPropertiesThrombus Aging +---------+---------------+---------+-----------+----------+--------------+ CFV      Full           Yes      Yes                                 +---------+---------------+---------+-----------+----------+--------------+ SFJ      Full                                                        +---------+---------------+---------+-----------+----------+--------------+ FV Prox  Full                                                        +---------+---------------+---------+-----------+----------+--------------+ FV Mid   Full                                                        +---------+---------------+---------+-----------+----------+--------------+ FV DistalFull                                                        +---------+---------------+---------+-----------+----------+--------------+ PFV      Full                                                        +---------+---------------+---------+-----------+----------+--------------+ POP      Full            Yes      Yes                                 +---------+---------------+---------+-----------+----------+--------------+ PTV      Full                                                        +---------+---------------+---------+-----------+----------+--------------+  PERO     Full                                                        +---------+---------------+---------+-----------+----------+--------------+   +----+---------------+---------+-----------+----------+--------------+ LEFTCompressibilityPhasicitySpontaneityPropertiesThrombus Aging +----+---------------+---------+-----------+----------+--------------+ CFV Full           Yes      Yes                                 +----+---------------+---------+-----------+----------+--------------+     Summary: RIGHT: - There is no evidence of deep vein thrombosis in the lower extremity.  - No cystic structure found in the popliteal fossa.  LEFT: - No evidence of common femoral vein obstruction.  *See table(s) above for measurements and observations. Electronically signed by Deitra Mayo MD on 01/11/2022 at 10:33:28 AM.    Final    DG Chest Portable 1 View  Result Date: 12/30/2021 CLINICAL DATA:  Productive cough. EXAM: PORTABLE CHEST 1 VIEW COMPARISON:  January 09, 2022 FINDINGS: Moderate to marked severity infiltrates are seen within the bilateral lung bases and upper left lung. This is increased in severity when compared to the prior study. Small, partially layering bilateral pleural effusions are noted. No pneumothorax is identified. The cardiac silhouette is mildly enlarged and unchanged in size. Moderate severity calcification of the aortic arch is noted. The visualized skeletal structures are unremarkable. IMPRESSION: 1. Moderate to marked severity bilateral infiltrates, increased in severity when compared to the prior study. 2. Small, partially layering bilateral pleural effusions. Electronically Signed   By: Virgina Norfolk M.D.   On: 01/18/2022 19:37   DG Chest 2 View  Result Date: 01/09/2022 CLINICAL DATA:  Chest pain. Nausea and vomiting. Large B-cell lymphoma. EXAM: CHEST - 2 VIEW COMPARISON:  07/13/2021 FINDINGS: Heart size is within normal limits. Scarring is again seen in the right lung base. Increased diffuse interstitial prominence is suspicious for mild interstitial edema. No evidence of parenchymal consolidation or pleural effusion. IMPRESSION: Increased diffuse interstitial prominence, suspicious for pulmonary interstitial edema. Electronically Signed   By: Marlaine Hind M.D.   On: 01/09/2022 11:54   DG Thoracic Spine 2 View  Result Date: 01/09/2022 CLINICAL DATA:  Thoracic back pain. EXAM: THORACIC SPINE 2 VIEWS COMPARISON:  None Available. FINDINGS: There is no evidence of thoracic spine fracture. Alignment is normal. No focal lytic or sclerotic bone lesions identified. Mild thoracic dextroscoliosis noted. Severe degenerative disc disease noted in the lower cervical and upper lumbar spine. IMPRESSION: No acute findings. Severe lower cervical and upper lumbar degenerative disc disease. Electronically Signed   By: Marlaine Hind M.D.   On: 01/09/2022 11:52   DG Ribs Bilateral  Result Date: 01/09/2022 CLINICAL DATA:  Bilateral rib and chest wall pain. EXAM: BILATERAL RIBS - 3+ VIEW COMPARISON:  None Available. FINDINGS: No acute fracture seen involving the ribs. Old fracture deformities are seen involving the right anterolateral 8th and 9th ribs. Other bone lesions identified. IMPRESSION: No acute findings. Old fracture deformities of right anterolateral 8th and 9th ribs. Electronically Signed   By: Marlaine Hind M.D.   On: 01/09/2022 11:51    Microbiology: Recent Results (from the past 240 hour(s))  Culture, blood (routine x 2)     Status: None   Collection Time: 01/16/2022  7:13 PM   Specimen: BLOOD  Result Value Ref Range Status   Specimen Description   Final    BLOOD RIGHT ANTECUBITAL Performed at  [facility] [address], [place], [place] [address]    Special Requests   Final    BOTTLES DRAWN AEROBIC AND ANAEROBIC Blood Culture adequate volume Performed at [facility] [address] [place], [place] [address]    Culture   Final    NO GROWTH 5 DAYS Performed at [facility], [place] [address] [place], [place] [address]    Report Status 01/15/2022 FINAL  Final  Culture, blood (routine x 2)     Status: None   Collection Time: 01/06/2022  7:13 PM   Specimen: BLOOD  Result Value Ref Range Status   Specimen Description   Final    BLOOD BLOOD LEFT FOREARM Performed at [facility] [address], [place], [place] [address]    Special Requests   Final    BOTTLES DRAWN AEROBIC AND ANAEROBIC Blood Culture adequate volume Performed at [facility] [address], [place], [place] [address]    Culture   Final    NO GROWTH 5 DAYS Performed at [facility], [place] [address], [place], [place] [address]    Report Status 01/15/2022 FINAL  Final  Resp Panel by RT-PCR (Flu A&B, Covid) Anterior Nasal Swab     Status: None   Collection Time: 01/11/2022  7:18 PM   Specimen: Anterior Nasal Swab  Result Value Ref Range Status   SARS Coronavirus 2 by RT PCR NEGATIVE NEGATIVE Final    Comment: (NOTE) SARS-CoV-2 target nucleic acids are NOT DETECTED.  The SARS-CoV-2 RNA is generally detectable in upper respiratory specimens during the acute phase of infection. The lowest concentration of SARS-CoV-2 viral copies this assay can detect is 138 copies/mL. A negative result does not preclude SARS-Cov-2 infection and should not be used as the sole basis for treatment or other patient management decisions. A negative result may occur with  improper specimen collection/handling, submission of specimen other than nasopharyngeal swab, presence of viral mutation(s) within the areas targeted by this assay, and  inadequate number of viral copies(<138 copies/mL). A negative result must be combined with clinical observations, patient history, and epidemiological information. The expected result is Negative.  Fact Sheet for Patients:  EntrepreneurPulse.com.au  Fact Sheet for Healthcare Providers:  IncredibleEmployment.be  This test is no t yet approved or cleared by the [place] FDA and  has been authorized for detection and/or diagnosis of SARS-CoV-2 by FDA under an Emergency Use Authorization (EUA). This EUA will remain  in effect (meaning this test can be used) for the duration of the COVID-19 declaration under Section 564(b)(1) of the Act, 21 U.S.C.section 360bbb-3(b)(1), unless the authorization is terminated  or revoked sooner.       Influenza A by PCR NEGATIVE NEGATIVE Final   Influenza B by PCR NEGATIVE NEGATIVE Final    Comment: (NOTE) The Xpert Xpress SARS-CoV-2/FLU/RSV plus assay is intended as an aid in the diagnosis of influenza from Nasopharyngeal swab specimens and should not be used as a sole basis for treatment. Nasal washings and aspirates are unacceptable for Xpert Xpress SARS-CoV-2/FLU/RSV testing.  Fact Sheet for Patients: EntrepreneurPulse.com.au  Fact Sheet for Healthcare Providers: IncredibleEmployment.be  This test is not yet approved or cleared by the [place] FDA and has been authorized for detection and/or diagnosis of SARS-CoV-2 by FDA under  an Emergency Use Authorization (EUA). This EUA will remain in effect (meaning this test can be used) for the duration of the COVID-19 declaration under Section 564(b)(1) of the Act, 21 U.S.C. section 360bbb-3(b)(1), unless the authorization is terminated or revoked.  Performed at [facility], [place] [address] [place], [place] [address]   MRSA Next Gen by PCR, Nasal     Status: None   Collection Time: 01/13/22  5:14  PM   Specimen: Nasal Mucosa; Nasal Swab  Result Value Ref Range Status   MRSA by PCR Next Gen NOT DETECTED NOT DETECTED Final    Comment: (NOTE) The GeneXpert MRSA Assay (FDA approved for NASAL specimens only), is one component of a comprehensive MRSA colonization surveillance program. It is not intended to diagnose MRSA infection nor to guide or monitor treatment for MRSA infections. Test performance is not FDA approved in patients less than 12 years old. Performed at [facility], [place] [address], [place], [place] [address]     Time spent: 25 minutes  Signed: Kathie Dike, MD Feb 01, 2022

## 2022-01-22 DEATH — deceased
